# Patient Record
Sex: Male | Born: 1955 | Race: White | Hispanic: No | Marital: Married | Smoking: Former smoker
Health system: Southern US, Community
[De-identification: ages and names within clinical notes are randomized; demographics above are authoritative.]

## PROBLEM LIST (undated history)

## (undated) DIAGNOSIS — N400 Enlarged prostate without lower urinary tract symptoms: Secondary | ICD-10-CM

## (undated) DIAGNOSIS — M25511 Pain in right shoulder: Secondary | ICD-10-CM

## (undated) DIAGNOSIS — T7840XA Allergy, unspecified, initial encounter: Secondary | ICD-10-CM

## (undated) DIAGNOSIS — R001 Bradycardia, unspecified: Secondary | ICD-10-CM

## (undated) DIAGNOSIS — F419 Anxiety disorder, unspecified: Secondary | ICD-10-CM

## (undated) DIAGNOSIS — K219 Gastro-esophageal reflux disease without esophagitis: Secondary | ICD-10-CM

## (undated) DIAGNOSIS — K429 Umbilical hernia without obstruction or gangrene: Secondary | ICD-10-CM

## (undated) DIAGNOSIS — Z860100 Personal history of colon polyps, unspecified: Secondary | ICD-10-CM

## (undated) DIAGNOSIS — C801 Malignant (primary) neoplasm, unspecified: Secondary | ICD-10-CM

## (undated) DIAGNOSIS — C9110 Chronic lymphocytic leukemia of B-cell type not having achieved remission: Secondary | ICD-10-CM

## (undated) DIAGNOSIS — D699 Hemorrhagic condition, unspecified: Secondary | ICD-10-CM

## (undated) DIAGNOSIS — F329 Major depressive disorder, single episode, unspecified: Secondary | ICD-10-CM

## (undated) DIAGNOSIS — F32A Depression, unspecified: Secondary | ICD-10-CM

## (undated) HISTORY — DX: Hemorrhagic condition, unspecified: D69.9

## (undated) HISTORY — DX: Benign prostatic hyperplasia without lower urinary tract symptoms: N40.0

## (undated) HISTORY — DX: Malignant (primary) neoplasm, unspecified: C80.1

## (undated) HISTORY — DX: Depression, unspecified: F32.A

## (undated) HISTORY — DX: Anxiety disorder, unspecified: F41.9

## (undated) HISTORY — PX: HERNIA REPAIR: SHX51

## (undated) HISTORY — DX: Pain in right shoulder: M25.511

## (undated) HISTORY — DX: Chronic lymphocytic leukemia of B-cell type not having achieved remission: C91.10

## (undated) HISTORY — DX: Umbilical hernia without obstruction or gangrene: K42.9

## (undated) HISTORY — DX: Major depressive disorder, single episode, unspecified: F32.9

## (undated) HISTORY — DX: Bradycardia, unspecified: R00.1

## (undated) HISTORY — DX: Allergy, unspecified, initial encounter: T78.40XA

## (undated) HISTORY — DX: Gastro-esophageal reflux disease without esophagitis: K21.9

## (undated) HISTORY — DX: Personal history of colon polyps, unspecified: Z86.0100

## (undated) HISTORY — DX: Personal history of colonic polyps: Z86.010

---

## 1999-11-05 ENCOUNTER — Encounter: Payer: Self-pay | Admitting: Family Medicine

## 1999-11-05 LAB — CONVERTED CEMR LAB: PSA: 0.7 ng/mL

## 2002-07-25 ENCOUNTER — Encounter: Payer: Self-pay | Admitting: Family Medicine

## 2004-08-27 ENCOUNTER — Ambulatory Visit: Payer: Self-pay | Admitting: Family Medicine

## 2004-10-17 ENCOUNTER — Ambulatory Visit: Payer: Self-pay | Admitting: Family Medicine

## 2004-10-21 ENCOUNTER — Ambulatory Visit: Payer: Self-pay | Admitting: Family Medicine

## 2005-02-19 ENCOUNTER — Ambulatory Visit: Payer: Self-pay | Admitting: Internal Medicine

## 2005-02-26 ENCOUNTER — Ambulatory Visit: Payer: Self-pay | Admitting: Internal Medicine

## 2006-05-04 ENCOUNTER — Ambulatory Visit: Payer: Self-pay | Admitting: Family Medicine

## 2006-05-06 ENCOUNTER — Ambulatory Visit: Payer: Self-pay | Admitting: Family Medicine

## 2006-07-21 ENCOUNTER — Ambulatory Visit: Payer: Self-pay | Admitting: Family Medicine

## 2006-07-22 ENCOUNTER — Emergency Department: Payer: Self-pay | Admitting: Emergency Medicine

## 2006-07-22 ENCOUNTER — Other Ambulatory Visit: Payer: Self-pay

## 2006-07-23 ENCOUNTER — Ambulatory Visit: Payer: Self-pay | Admitting: Family Medicine

## 2006-07-27 ENCOUNTER — Ambulatory Visit: Payer: Self-pay | Admitting: Family Medicine

## 2006-08-04 ENCOUNTER — Ambulatory Visit: Payer: Self-pay | Admitting: Internal Medicine

## 2006-08-13 ENCOUNTER — Ambulatory Visit: Payer: Self-pay | Admitting: Internal Medicine

## 2006-09-01 ENCOUNTER — Ambulatory Visit: Payer: Self-pay | Admitting: Family Medicine

## 2006-09-07 ENCOUNTER — Encounter: Payer: Self-pay | Admitting: Family Medicine

## 2006-10-13 ENCOUNTER — Ambulatory Visit: Payer: Self-pay | Admitting: *Deleted

## 2006-10-21 ENCOUNTER — Ambulatory Visit: Payer: Self-pay | Admitting: Family Medicine

## 2006-10-23 ENCOUNTER — Ambulatory Visit: Payer: Self-pay | Admitting: Family Medicine

## 2006-11-09 ENCOUNTER — Ambulatory Visit: Payer: Self-pay | Admitting: Family Medicine

## 2007-05-03 ENCOUNTER — Encounter: Payer: Self-pay | Admitting: Family Medicine

## 2007-05-03 DIAGNOSIS — J309 Allergic rhinitis, unspecified: Secondary | ICD-10-CM | POA: Insufficient documentation

## 2007-05-03 DIAGNOSIS — K219 Gastro-esophageal reflux disease without esophagitis: Secondary | ICD-10-CM | POA: Insufficient documentation

## 2007-05-10 ENCOUNTER — Ambulatory Visit: Payer: Self-pay | Admitting: Family Medicine

## 2007-05-10 LAB — CONVERTED CEMR LAB
ALT: 54 units/L — ABNORMAL HIGH (ref 0–53)
Albumin: 3.9 g/dL (ref 3.5–5.2)
Alkaline Phosphatase: 75 units/L (ref 39–117)
BUN: 12 mg/dL (ref 6–23)
Basophils Absolute: 0 10*3/uL (ref 0.0–0.1)
Basophils Relative: 0 % (ref 0.0–1.0)
Calcium: 9 mg/dL (ref 8.4–10.5)
Cholesterol: 224 mg/dL (ref 0–200)
Creatinine, Ser: 1 mg/dL (ref 0.4–1.5)
Direct LDL: 155.3 mg/dL
GFR calc Af Amer: 102 mL/min
HDL: 37.9 mg/dL — ABNORMAL LOW (ref >39.0)
Hemoglobin: 13.9 g/dL (ref 13.0–17.0)
MCHC: 35.1 g/dL (ref 30.0–36.0)
Monocytes Absolute: 0.6 10*3/uL (ref 0.2–0.7)
Monocytes Relative: 8.8 % (ref 3.0–11.0)
Platelets: 278 10*3/uL (ref 150–400)
Potassium: 4.1 meq/L (ref 3.5–5.1)
RDW: 12.5 % (ref 11.5–14.6)
TSH: 0.6 microintl units/mL (ref 0.35–5.50)
Total Bilirubin: 0.9 mg/dL (ref 0.3–1.2)
Total CHOL/HDL Ratio: 5.9
Triglycerides: 176 mg/dL — ABNORMAL HIGH (ref 0–149)
VLDL: 35 mg/dL (ref 0–40)

## 2007-05-12 ENCOUNTER — Ambulatory Visit: Payer: Self-pay | Admitting: Family Medicine

## 2007-05-12 DIAGNOSIS — F4323 Adjustment disorder with mixed anxiety and depressed mood: Secondary | ICD-10-CM | POA: Insufficient documentation

## 2007-05-27 ENCOUNTER — Encounter: Payer: Self-pay | Admitting: Family Medicine

## 2007-06-15 ENCOUNTER — Ambulatory Visit: Payer: Self-pay | Admitting: Family Medicine

## 2007-06-16 ENCOUNTER — Encounter (INDEPENDENT_AMBULATORY_CARE_PROVIDER_SITE_OTHER): Payer: Self-pay | Admitting: *Deleted

## 2007-07-01 ENCOUNTER — Telehealth (INDEPENDENT_AMBULATORY_CARE_PROVIDER_SITE_OTHER): Payer: Self-pay | Admitting: *Deleted

## 2007-07-09 ENCOUNTER — Ambulatory Visit (HOSPITAL_BASED_OUTPATIENT_CLINIC_OR_DEPARTMENT_OTHER): Admission: RE | Admit: 2007-07-09 | Discharge: 2007-07-09 | Payer: Self-pay | Admitting: Surgery

## 2007-07-10 ENCOUNTER — Encounter: Payer: Self-pay | Admitting: Family Medicine

## 2007-08-10 ENCOUNTER — Ambulatory Visit: Payer: Self-pay | Admitting: Family Medicine

## 2007-08-10 DIAGNOSIS — E7800 Pure hypercholesterolemia, unspecified: Secondary | ICD-10-CM | POA: Insufficient documentation

## 2007-08-10 LAB — CONVERTED CEMR LAB
HDL: 39.3 mg/dL (ref >39.0)
Total CHOL/HDL Ratio: 4.1
Triglycerides: 133 mg/dL (ref 0–149)

## 2007-08-12 ENCOUNTER — Ambulatory Visit: Payer: Self-pay | Admitting: Family Medicine

## 2007-10-01 ENCOUNTER — Telehealth (INDEPENDENT_AMBULATORY_CARE_PROVIDER_SITE_OTHER): Payer: Self-pay | Admitting: *Deleted

## 2007-11-04 ENCOUNTER — Telehealth (INDEPENDENT_AMBULATORY_CARE_PROVIDER_SITE_OTHER): Payer: Self-pay | Admitting: *Deleted

## 2007-11-09 ENCOUNTER — Ambulatory Visit: Payer: Self-pay | Admitting: Family Medicine

## 2008-05-17 ENCOUNTER — Ambulatory Visit: Payer: Self-pay | Admitting: Family Medicine

## 2008-05-18 LAB — CONVERTED CEMR LAB
AST: 28 units/L (ref 0–37)
Alkaline Phosphatase: 67 units/L (ref 39–117)
Bilirubin, Direct: 0.1 mg/dL (ref 0.0–0.3)
CO2: 31 meq/L (ref 19–32)
Chloride: 111 meq/L (ref 96–112)
GFR calc Af Amer: 91 mL/min
Glucose, Bld: 97 mg/dL (ref 70–99)
HDL: 35.9 mg/dL — ABNORMAL LOW (ref >39.0)
LDL Cholesterol: 119 mg/dL — ABNORMAL HIGH (ref 0–99)
Lymphocytes Relative: 25.4 % (ref 12.0–46.0)
Monocytes Relative: 9.2 % (ref 3.0–12.0)
Neutrophils Relative %: 63.1 % (ref 43.0–77.0)
Platelets: 284 10*3/uL (ref 150–400)
Potassium: 4.4 meq/L (ref 3.5–5.1)
RDW: 12.3 % (ref 11.5–14.6)
Sodium: 143 meq/L (ref 135–145)
Total CHOL/HDL Ratio: 4.8
Total Protein: 6.7 g/dL (ref 6.0–8.3)
Triglycerides: 95 mg/dL (ref 0–149)

## 2008-05-23 ENCOUNTER — Ambulatory Visit: Payer: Self-pay | Admitting: Family Medicine

## 2008-08-21 ENCOUNTER — Telehealth: Payer: Self-pay | Admitting: Family Medicine

## 2008-11-10 ENCOUNTER — Emergency Department: Payer: Self-pay

## 2008-12-12 ENCOUNTER — Ambulatory Visit: Payer: Self-pay | Admitting: Family Medicine

## 2009-02-27 ENCOUNTER — Telehealth: Payer: Self-pay | Admitting: Family Medicine

## 2009-09-24 ENCOUNTER — Telehealth: Payer: Self-pay | Admitting: Family Medicine

## 2009-09-25 ENCOUNTER — Telehealth: Payer: Self-pay | Admitting: Family Medicine

## 2009-09-28 ENCOUNTER — Ambulatory Visit: Payer: Self-pay | Admitting: Family Medicine

## 2009-09-30 LAB — CONVERTED CEMR LAB
Albumin: 4.3 g/dL (ref 3.5–5.2)
BUN: 8 mg/dL (ref 6–23)
Basophils Relative: 0 % (ref 0.0–3.0)
Bilirubin, Direct: 0.1 mg/dL (ref 0.0–0.3)
Chloride: 107 meq/L (ref 96–112)
Cholesterol: 200 mg/dL (ref 0–200)
Eosinophils Relative: 1.9 % (ref 0.0–5.0)
HCT: 43 % (ref 39.0–52.0)
LDL Cholesterol: 120 mg/dL — ABNORMAL HIGH (ref 0–99)
Lymphs Abs: 1.4 10*3/uL (ref 0.7–4.0)
MCV: 92.2 fL (ref 78.0–100.0)
Monocytes Absolute: 0.4 10*3/uL (ref 0.1–1.0)
Neutro Abs: 3.5 10*3/uL (ref 1.4–7.7)
Potassium: 4.1 meq/L (ref 3.5–5.1)
RBC: 4.67 M/uL (ref 4.22–5.81)
Total Protein: 7 g/dL (ref 6.0–8.3)
VLDL: 37 mg/dL (ref 0.0–40.0)
WBC: 5.4 10*3/uL (ref 4.5–10.5)

## 2009-10-04 ENCOUNTER — Ambulatory Visit: Payer: Self-pay | Admitting: Family Medicine

## 2010-03-28 ENCOUNTER — Encounter (INDEPENDENT_AMBULATORY_CARE_PROVIDER_SITE_OTHER): Payer: Self-pay | Admitting: *Deleted

## 2010-03-29 ENCOUNTER — Ambulatory Visit: Payer: Self-pay | Admitting: Family Medicine

## 2010-03-29 DIAGNOSIS — K589 Irritable bowel syndrome without diarrhea: Secondary | ICD-10-CM | POA: Insufficient documentation

## 2010-05-14 ENCOUNTER — Ambulatory Visit: Payer: Self-pay | Admitting: Internal Medicine

## 2010-06-27 ENCOUNTER — Ambulatory Visit: Payer: Self-pay | Admitting: Internal Medicine

## 2010-07-01 ENCOUNTER — Encounter (INDEPENDENT_AMBULATORY_CARE_PROVIDER_SITE_OTHER): Payer: Self-pay | Admitting: *Deleted

## 2010-07-07 ENCOUNTER — Encounter: Payer: Self-pay | Admitting: Family Medicine

## 2010-07-11 ENCOUNTER — Telehealth: Payer: Self-pay | Admitting: Family Medicine

## 2010-09-23 ENCOUNTER — Telehealth (INDEPENDENT_AMBULATORY_CARE_PROVIDER_SITE_OTHER): Payer: Self-pay | Admitting: *Deleted

## 2010-09-25 ENCOUNTER — Encounter: Payer: Self-pay | Admitting: Family Medicine

## 2010-09-25 ENCOUNTER — Other Ambulatory Visit (INDEPENDENT_AMBULATORY_CARE_PROVIDER_SITE_OTHER): Payer: Medicare HMO

## 2010-09-25 ENCOUNTER — Ambulatory Visit: Admit: 2010-09-25 | Payer: Self-pay | Admitting: Family Medicine

## 2010-09-25 ENCOUNTER — Encounter (INDEPENDENT_AMBULATORY_CARE_PROVIDER_SITE_OTHER): Payer: Self-pay | Admitting: *Deleted

## 2010-09-25 ENCOUNTER — Other Ambulatory Visit: Payer: Self-pay | Admitting: Family Medicine

## 2010-09-25 DIAGNOSIS — E7800 Pure hypercholesterolemia, unspecified: Secondary | ICD-10-CM

## 2010-09-25 DIAGNOSIS — K589 Irritable bowel syndrome without diarrhea: Secondary | ICD-10-CM

## 2010-09-25 DIAGNOSIS — Z125 Encounter for screening for malignant neoplasm of prostate: Secondary | ICD-10-CM

## 2010-09-25 DIAGNOSIS — E785 Hyperlipidemia, unspecified: Secondary | ICD-10-CM

## 2010-09-25 LAB — BASIC METABOLIC PANEL
BUN: 9 mg/dL (ref 6–23)
Calcium: 9.5 mg/dL (ref 8.4–10.5)
Creatinine, Ser: 1.1 mg/dL (ref 0.4–1.5)
GFR: 75.66 mL/min (ref >60.00)
Glucose, Bld: 94 mg/dL (ref 70–99)
Sodium: 141 mEq/L (ref 135–145)

## 2010-09-25 LAB — LIPID PANEL
Cholesterol: 234 mg/dL — ABNORMAL HIGH (ref 0–200)
HDL: 41.4 mg/dL (ref >39.00)
Triglycerides: 164 mg/dL — ABNORMAL HIGH (ref 0.0–149.0)
VLDL: 32.8 mg/dL (ref 0.0–40.0)

## 2010-09-25 LAB — HEPATIC FUNCTION PANEL
Albumin: 4.2 g/dL (ref 3.5–5.2)
Alkaline Phosphatase: 87 U/L (ref 39–117)

## 2010-09-25 NOTE — Assessment & Plan Note (Signed)
Summary: CPX/RBH   Vital Signs:  Patient profile:   55 year old male Weight:      189 pounds BMI:     24.35 Temp:     98.6 degrees F oral Pulse rate:   84 / minute Pulse rhythm:   regular BP sitting:   118 / 70  (left arm) Cuff size:   regular  Vitals Entered By: Sydell Axon LPN (October 15, 2009 1:51 PM) CC: 30 minute checkup, had a colonoscopy by Dr. Marina Goodell 2-3 years ago because of stomach problems   History of Present Illness: Pt here for Comp Exam.   Preventive Screening-Counseling & Management  Alcohol-Tobacco     Alcohol drinks/day: 1     Alcohol type: beer      Smoking Status: quit     Year Quit: 1982     Pack years: 8     Passive Smoke Exposure: no  Caffeine-Diet-Exercise     Caffeine use/day:  2 per week     Does Patient Exercise: no  Problems Prior to Update: 1)  Other Spec Sites of Sprains and Strains, Chestwall  (ICD-848.8) 2)  Pure Hypercholesterolemia  (ICD-272.0) 3)  Screening For Malignannt Neoplasm, Site Nec  (ICD-V76.49) 4)  Anxiety Depression  (ICD-300.4) 5)  Health Maintenance Exam  (ICD-V70.0) 6)  Screening For Malignant Neoplasm, Prostate  (ICD-V76.44) 7)  Crohn's Disease Via Colonosopy  (ICD-555.9) 8)  Hypertriglyceridemia, Mild (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 Take 1 Tablet By Mouth Once A Day 2)  Flonase 50 Mcg/act Susp (Fluticasone Propionate) .... Spray 2 Spray Into Both Nostrils Once A Day 3)  Effexor Xr 37.5 Mg Xr24h-Cap (Venlafaxine Hcl) .... Take One By Mouth Twice A Day  Allergies: 1)  Augmentin  Past History:  Past Medical History: Last updated: 05/03/2007 Allergic rhinitis GERD  Past Surgical History: Last updated: 07/26/2007 HOSP Gerd 3 days 1980 Bilat Hernia Repairs 1996 EGD wnl 04/28/96 EGD, GERD  07/10/03 Colonoscopy, polyps, divertics, 07/10/03 EGD, GERD 02/26/05 Recurrent Inguinal  Hernia Repair (Dr Daphine Deutscher) 07/09/07  Family History: Last updated: 10-15-2009 Father: Died at age 16 with prostate cancer, depression and obesity Mother: A  85  hypertension, elevated cholesterol Brother A  60 high cholesterol Brother A  43 high cholesterol Sister A  62 DM  Sister A  46 CV -  BP + Mother Diabetes + Father Stoke + Eustace Quail Cancer- Prostate + Father Depression + Father  Social History: Last updated: 05/03/2007 Marital Status: Remarried since May 2005  Children: 2 with his past marriage Occupation: Scientist, 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 [place]  Risk Factors: Alcohol Use: 1 (Oct 15, 2009) Caffeine Use:  2 per week (10/15/2009) Exercise: no (October 15, 2009)  Risk Factors: Smoking Status: quit (Oct 15, 2009) Passive Smoke Exposure: no (10-15-09)  Family History: Father: Died at age 81 with prostate cancer, depression and obesity Mother: A  85  hypertension, elevated cholesterol Brother A  60 high cholesterol Brother A  43 high cholesterol Sister A  62 DM  Sister A  46 CV -  BP + Mother Diabetes + Father Stoke + Pat Uncles Cancer- Prostate + Father Depression + Father  Social History: Caffeine use/day:   2 per week  Review of Systems General:  Denies chills, fatigue, fever, sweats, weakness, and weight loss. Eyes:  Denies blurring, discharge, eye pain, itching, and red eye. ENT:  Complains of decreased hearing  and ringing in ears; denies ear discharge and earache. CV:  Denies chest pain or discomfort, fainting, fatigue, palpitations, shortness of breath with exertion, and swelling of feet. Resp:  Denies cough, shortness of breath, and wheezing. GI:  Complains of indigestion; denies abdominal pain, bloody stools, change in bowel habits, constipation, dark tarry stools, diarrhea, loss of appetite, nausea, vomiting, vomiting blood, and yellowish skin color; occas. GU:  Denies discharge, dysuria, nocturia, and urinary frequency. MS:  Denies joint pain,  low back pain, muscle aches, muscle weakness, and stiffness. Derm:  Denies dryness, itching, and rash. Neuro:  Denies memory loss, numbness, tingling, and tremors.   Impression & Recommendations:  Problem # 1:  HEALTH MAINTENANCE EXAM (ICD-V70.0) Assessment Comment Only  Problem # 2:  PURE HYPERCHOLESTEROLEMIA (ICD-272.0) Assessment: Unchanged Trigs slightly elevated, avoid sweets and carbs. Labs Reviewed: SGOT: 22 (09/28/2009)   SGPT: 22 (09/28/2009)   HDL:43.20 (09/28/2009), 35.9 (05/17/2008)  LDL:120 (09/28/2009), 119 (05/17/2008)  Chol:200 (09/28/2009), 174 (05/17/2008)  Trig:185.0 (09/28/2009), 95 (05/17/2008)  Problem # 3:  SCREENING FOR MALIGNANT NEOPLASM, PROSTATE (ICD-V76.44) Assessment: Unchanged Stable.  Problem # 4:  CROHN'S DISEASE VIA COLONOSOPY (ICD-555.9) Check with GI for repeat colonoscopy.  Problem # 5:  HYPERTRIGLYCERIDEMIA, MILD (206) (ICD-272.1) Assessment: Unchanged Trig slighytly. Labs Reviewed: SGOT: 22 (09/28/2009)   SGPT: 22 (09/28/2009)   HDL:43.20 (09/28/2009), 35.9 (05/17/2008)  LDL:120 (09/28/2009), 119 (05/17/2008)  Chol:200 (09/28/2009), 174 (05/17/2008)  Trig:185.0 (09/28/2009), 95 (05/17/2008)  Problem # 6:  GERD (ICD-530.81) Assessment: Unchanged Stable. His updated medication list for this problem includes:    Aciphex 20 Mg Tbec (Rabeprazole sodium) .Marland Kitchen... Take 1 tablet by mouth once a day  Diagnostics Reviewed:  Discussed lifestyle modifications, diet, antacids/medications, and preventive measures. Handout provided.   Complete Medication List: 1)  Aciphex 20 Mg Tbec (Rabeprazole sodium) .... Take 1 tablet by mouth once a day 2)  Flonase 50 Mcg/act Susp (Fluticasone propionate) .... Spray 2 spray into both nostrils once a day 3)  Effexor Xr 37.5 Mg Xr24h-cap (Venlafaxine hcl) .... Take one by mouth twice a day  Patient Instructions: 1)  RTC one year or as needed.  Current Allergies (reviewed today): AUGMENTIN

## 2010-09-25 NOTE — Progress Notes (Signed)
Summary: Rx Fluticasone & Effexor  Phone Note Refill Request Call back at [phone] Message from:  Karin Golden on September 24, 2009 8:27 AM  Refills Requested: Medication #1:  FLONASE 50 MCG/ACT SUSP Spray 2 spray into both nostrils once a day   Last Refilled: 08/12/2009  Medication #2:  EFFEXOR XR 37.5 MG XR24H-CAP take one by mouth twice a day.   Last Refilled: 08/12/2009 Received faxed refill request, please advise   Method Requested: Electronic Initial call taken by: Linde Gillis CMA Duncan Dull),  September 24, 2009 8:27 AM    Prescriptions: EFFEXOR XR 37.5 MG XR24H-CAP (VENLAFAXINE HCL) take one by mouth twice a day  #90 x 3   Entered and Authorized by:   Shaune Leeks MD   Signed by:   Shaune Leeks MD on 09/24/2009   Method used:   Electronically to        [facility] [address]* (retail)       [address] [place], [place]  [address]       Ph: [phone]       Fax: [phone]   RxID:   [id number] FLONASE 50 MCG/ACT SUSP (FLUTICASONE PROPIONATE) Spray 2 spray into both nostrils once a day  #1 x 12   Entered and Authorized by:   Shaune Leeks MD   Signed by:   Shaune Leeks MD on 09/24/2009   Method used:   Electronically to        [facility] [address] (retail)       [address] [place], [place]  [address]       Ph: [phone]       Fax: [phone]   RxID:   [id number]

## 2010-09-25 NOTE — Assessment & Plan Note (Signed)
Summary: transfer from [facility]   Vital Signs:  Patient profile:   55 year old male Height:      74 inches Weight:      184.75 pounds Temp:     98.6 degrees F oral Pulse rate:   60 / minute Pulse rhythm:   regular BP sitting:   110 / 60  (left arm) Cuff size:   large  Vitals Entered By: Selena Batten Dance CMA Duncan Dull) (March 29, 2010 2:31 PM)  CC: Follow up   History of Present Illness: CC: GERD  GERD - On aciphex 20mg  QAM, then over last few weeks has increased to two times a day because one not improving.  Previously tried prilosec then changed to aciphex.  sxs: reflux at night when lays down.  Using bed wedge.  Doesn't really notice difference with foods.  Tries to eat at least 3 hours prior to sleep.  Early satiety over last few years.  No nausea/vomiting, no weight loss.  Feels food doesn't digest normally.  Doesn't smoke, minimial EtOH and caffeine.    Did have colonoscopy/endoscsopy in last 5 years (and has had several in past).  ? h/o crohn's vs IBS.  Anxiety - feels anxiety exacerbates IBS/stomache issues.  Would like refill of xanax (previously prescribed by Hetty Ely).  Taking effexor to help with bowels.  Reviewing previous GI notes - dx with GERD, IBS, nonspecific mild ileitis from previous colonoscopy, IBD markers negative, celiac sprue negative, ESR normal, TSH normal.  Preventive Screening-Counseling & Management  Alcohol-Tobacco     Alcohol drinks/day: <1     Smoking Status: never  Caffeine-Diet-Exercise     Caffeine use/day: 0-1      Drug Use:  never.    Current Medications (verified): 1)  Nexium 40 Mg Pack (Esomeprazole Magnesium) .... One Daily For Reflux 2)  Flonase 50 Mcg/act Susp (Fluticasone Propionate) .... Spray 2 Spray Into Both Nostrils Once A Day 3)  Effexor Xr 37.5 Mg Xr24h-Cap (Venlafaxine Hcl) .... Take One By Mouth Twice A Day 4)  Ativan 1 Mg Tabs (Lorazepam) .... Take One By Mouth Two Times A Day As Needed Anxiety 5)  Multivitamins  Tabs  (Multiple Vitamin) .... Per Report 6)  Vitamin D3 400 Unit Chew (Cholecalciferol) .... Per Report  Allergies: 1)  Augmentin  Past History:  Past medical, surgical, family and social histories (including risk factors) reviewed for relevance to current acute and chronic problems.  Past Medical History: Reviewed history from 05/03/2007 and no changes required. Allergic rhinitis GERD  Past Surgical History: HOSP Gerd 3 days 1980 Bilat Hernia Repairs 1996 EGD wnl 04/28/96 EGD, GERD  07/10/03 Colonoscopy, polyps, divertics, 07/10/03 EGD, GERD 02/26/05 Recurrent Inguinal Hernia Repair with mesh (Dr Daphine Deutscher) 07/09/07  Family History: Reviewed history from 10/04/2009 and no changes required. Father: Died at age 58 with prostate cancer, depression and obesity Mother: A  82  hypertension, elevated cholesterol Brother A  60 high cholesterol Brother A  43 high cholesterol Sister A  62 DM  Sister A  46 CV -  BP + Mother Diabetes + Father Stoke + Pat Uncles Cancer- Prostate + Father Depression + Father  No other CA (besides prostate), no CAD, no CVA.  Social History: Reviewed history from 05/03/2007 and no changes required. No smoking, rec drugs,  + social EtOH. 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 Head:  Normocephalic and atraumatic without obvious abnormalities. No apparent alopecia or balding. Mouth:  Oral mucosa and oropharynx without lesions or exudates.  pharynx with white patches Neck:  No deformities, masses, or tenderness noted. Lungs:  Normal respiratory effort, chest expands symmetrically. Lungs  are clear to auscultation, no crackles or wheezes. Good inspiration, good breath sounds to the periphery. Heart:  Normal rate and regular rhythm. S1 and S2 normal without gallop, murmur, click, rub or other extra sounds. Abdomen:  Bowel sounds positive,abdomen soft and non-tender without masses, organomegaly or hernias noted. Extremities:  no edema Skin:  Intact without suspicious lesions or rashes   Impression & Recommendations:  Problem # 1:  GERD (ICD-530.81) Assessment Deteriorated Pt knows GERD precautions.  Discussed foods that worsen reflux.  aciphex no longer helping.  trial of nexium, RTC 1 mo for f/u.  If no improvement, low threshold to refer to GI for f/u given established and endorsing worsening GERD/early satiety (only 5 lb weight loss in last 6 months).  previous workup negative for IBD and celiac sprue.  Latest TSH 0.8  His updated medication list for this problem includes:    Nexium 40 Mg Pack (Esomeprazole magnesium) ..... One daily for reflux  Problem # 2:  ILEITIS PER COLONOSCOPY (ICD-558.9) w/u for celiac sprue and IBD negative per latest GI note.  Problem # 3:  IRRITABLE BOWEL SYNDROME (ICD-564.1) no BM changes currently.  continue to monitor.  apparently previously did not improve on reglan.  Problem # 4:  PURE HYPERCHOLESTEROLEMIA (ICD-272.0) latest LDL 120.  off statin.  recheck in 1 year.  consider restart statin.  Labs Reviewed: SGOT: 22 (09/28/2009)   SGPT: 22 (09/28/2009)   HDL:43.20 (09/28/2009), 35.9 (05/17/2008)  LDL:120 (09/28/2009), 119 (05/17/2008)  Chol:200 (09/28/2009), 174 (05/17/2008)  Trig:185.0 (09/28/2009), 95 (05/17/2008)  Problem # 5:  ANXIETY DEPRESSION (ICD-300.4) discussed detriments of relying on pills to cope with anxiety.  discussed my dislike of xanax.  trial of ativan, work on other relaxation techniques to cope with anxiety.  Continue effexor for depression/GI   Complete Medication List: 1)  Nexium 40 Mg Pack (Esomeprazole  magnesium) .... One daily for reflux 2)  Flonase 50 Mcg/act Susp (Fluticasone propionate) .... Spray 2 spray into both nostrils once a day 3)  Effexor Xr 37.5 Mg Xr24h-cap (Venlafaxine hcl) .... Take one by mouth twice a day 4)  Ativan 1 Mg Tabs (Lorazepam) .... Take one by mouth two times a day as needed anxiety 5)  Multivitamins Tabs (Multiple vitamin) .... Per report 6)  Vitamin D3 400 Unit Chew (Cholecalciferol) .... Per report  Patient Instructions: 1)  Return in 1-2 months for follow up. 2)  Return in 6 months for CPE. 3)  Stop aciphex.  Start Nexium one daily for reflux. 4)  Stay away from acidic foods like citruses, mints, and spicy foods.  Also minimize caffeine. 5)  Continue other measures for reflux. 6)  Pleasure to meet you today. Prescriptions: ATIVAN 1 MG TABS (LORAZEPAM) take one by mouth two times a day as needed anxiety  #30 x 0   Entered and Authorized by:   [name]  MD   Signed by:   [name]  MD on 03/29/2010   Method used:   Print then Give to Patient   RxID:  [id number] NEXIUM 40 MG PACK (ESOMEPRAZOLE MAGNESIUM) one daily for reflux  #90 x 3   Entered and Authorized by:   [name]  MD   Signed by:   [name]  MD on 03/29/2010   Method used:   Electronically to        [facility] [address] [address] (retail)       [address] [place], [place]  [address]       Ph: [phone]       Fax: [phone]   RxID:   [id number]   Current Allergies (reviewed today): AUGMENTIN

## 2010-09-25 NOTE — Progress Notes (Signed)
Summary: Clarification of Rx Effexor vs. Venlafaxine  Phone Note From Pharmacy Call back at [phone]   Caller: [facility] [address] Call For: Dr. Hetty Ely  Summary of Call: Received electronic Rx for Effexor XR 37.5mg .  Pharmacy called to verify that we are changing Venlafaxine HCL 37.5mg  to Effexor.  Please advise. Initial call taken by: Linde Gillis CMA Duncan Dull),  September 25, 2009 8:49 AM  Follow-up for Phone Call        Generic is fine. Follow-up by: Shaune Leeks MD,  September 25, 2009 1:45 PM  Additional Follow-up for Phone Call Additional follow up Details #1::        Spoke to pharmacist, Rx sent in for Effexor XR, patient is taking and has been on the plain Effexor.  Advised that plain Rx is ok and generic is ok as well. Additional Follow-up by: Linde Gillis CMA Duncan Dull),  September 25, 2009 1:55 PM

## 2010-09-25 NOTE — Letter (Signed)
Summary: [name] letter  [place] [facility]  [address] [place], [place] [address]   Phone: [phone]  Fax: [phone]       03/28/2010 MRN: [id number]  [facility] [address] [place], [place]  [address]  Dear [facility] [place], and [place] announce the retirement of Arta Silence, M.D., from full-time practice at the [facility] office effective February 21, 2010 and his plans of returning part-time.  It is important to Dr. Hetty Ely and to our practice that you understand that [facility] has seven physicians in our office for your health care needs.  We will continue to offer the same exceptional care that you have today.    Dr. Hetty Ely has spoken to many of you about his plans for retirement and returning part-time in the fall.   We will continue to work with you through the transition to schedule appointments for you in the office and meet the high standards that [facility] is committed to.   Again, it is with great pleasure that we share the news that Dr. Hetty Ely will return to [facility] in October of 2011 with a reduced schedule.    If you have any questions, or would like to request an appointment with one of our physicians, please call us at [phone] and press the option for Scheduling an appointment.  We take pleasure in providing you with excellent patient care and look forward to seeing you at your next office visit.  Our [facility] Physicians are:  Tillman Abide, M.D. Laurita Quint, M.D. Roxy Manns, M.D. Kerby Nora, M.D. Hannah Beat, M.D. Ruthe Mannan, M.D. We proudly welcomed Raechel Ache, M.D. and [name], M.D. to the practice in July/August 2011.  Sincerely,  [facility]

## 2010-09-25 NOTE — Miscellaneous (Signed)
Summary: MED UPDATE   Medications Added NEXIUM 40 MG CPDR (ESOMEPRAZOLE MAGNESIUM)        Clinical Lists Changes  Medications: Changed medication from NEXIUM 40 MG PACK (ESOMEPRAZOLE MAGNESIUM) one twice daily for reflux to NEXIUM 40 MG CPDR (ESOMEPRAZOLE MAGNESIUM)     Prior Medications: FLONASE 50 MCG/ACT SUSP (FLUTICASONE PROPIONATE) Spray 2 spray into both nostrils once a day EFFEXOR XR 37.5 MG XR24H-CAP (VENLAFAXINE HCL) take one by mouth twice a day ATIVAN 1 MG TABS (LORAZEPAM) take one by mouth two times a day as needed anxiety MULTIVITAMINS  TABS (MULTIPLE VITAMIN) per report VITAMIN D3 400 UNIT CHEW (CHOLECALCIFEROL) one daily RANITIDINE HCL 150 MG CAPS (RANITIDINE HCL) one by mouth at bedtime Current Allergies: AUGMENTIN

## 2010-09-25 NOTE — Progress Notes (Signed)
Summary: ?info  Phone Note Call from Patient Call back at Home Phone [phone]   Caller: Patient Call For: [name]  MD Summary of Call: Patient  says the was supposed to call you with this information   Patient says that he is only taken 37.5 once a day and it is not time released, also he is going to stay on nexium because insurance will not cover dexilant. Initial call taken by: Benny Lennert CMA Duncan Dull),  July 11, 2010 4:50 PM  Follow-up for Phone Call        thank you.  noted. Follow-up by: [name]  MD,  July 11, 2010 5:01 PM    New/Updated Medications: VENLAFAXINE HCL 37.5 MG TABS (VENLAFAXINE HCL) one daily for anxiety/bowels

## 2010-09-25 NOTE — Assessment & Plan Note (Signed)
Summary: FOLLOW UP / LFW   Vital Signs:  Patient profile:   55 year old male Weight:      186.25 pounds Temp:     98.7 degrees F oral Pulse rate:   80 / minute Pulse rhythm:   regular BP sitting:   110 / 70  (left arm) Cuff size:   large  Vitals Entered By: [name] (May 14, 2010 8:00 AM) CC: 2 month Follow up   History of Present Illness: CC: f/u stomach  Aciphex BID did not help reflux sxs so switched to Nexium once daily which seems to be controlling acid well, but still feels occasional "lump in throat".  Also slight bloating/discomfort.  Sometimes has gas.  Previously aciphex not controlling acid well.  Does drink 1 beer/day.  Last colonoscopy/EGD - 2007 thought IBS 2/2 anxiety.  weight up 2 lbs.  sxs: reflux at night when lays down.  Using bed wedge.  Doesn't really notice difference with foods.  Tries to eat at least 3 hours prior to sleep.  No nausea/vomiting, no weight loss.  Feels food doesn't digest normally.  Previously on aciphex and prilosec.  Doesn't smoke, minimal caffeine ad EtOH (1 beer a day)  Did have colonoscopy/endoscsopy in last 5 years (and has had several in past).  Reviewing previous GI notes - dx with GERD, IBS, nonspecific mild ileitis from previous colonoscopy, IBD markers negative, celiac sprue negative, ESR normal, TSH normal.  Anxiety - feels anxiety exacerbates IBS/stomache issues.  Taking effexor to help with bowels.  only has been on daily dosing of effexor.  Started on ativan last visit, hasn't needed to take.  Allergies: 1)  Augmentin  Past History:  Past Medical History: Last updated: 05/03/2007 Allergic rhinitis GERD  Past Surgical History: Last updated: 03/29/2010 Para [place] 3 days 1980 Bilat Hernia Repairs 1996 EGD wnl 04/28/96 EGD, GERD  07/10/03 Colonoscopy, polyps, divertics, 07/10/03 EGD, GERD 02/26/05 Recurrent Inguinal Hernia Repair with mesh (Dr Daphine Deutscher) 07/09/07  Social History: Last updated:  03/29/2010 No smoking, rec drugs,  + social EtOH. 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 Psych:  normal affect, pleasant, not anxious/nervous   Impression & Recommendations:  Problem # 1:  GERD (ICD-530.81) Pt knows GERD precautions.  Nexium has improved symptoms, but not optimal control as evidenced by pt's feeling of "lump in throat".  Start ranitidine at night.  if not better, consider increasing nexium to BID.  If no improvement, refer to GI for f/u given established and endorsing worsening GERD.  previous workup negative for IBD and celiac sprue.  Latest TSH 0.8 (09/2009).  rec stop EtOH.  His updated medication list for this problem includes:    Nexium 40 Mg Pack (Esomeprazole magnesium) ..... One daily for reflux    Ranitidine Hcl 150 Mg Caps (Ranitidine hcl) ..... One by mouth at bedtime  Problem # 2:  ANXIETY DEPRESSION (ICD-300.4) rec increase effexor to BID.  hasn't needed to use ativan.  ?if anxiety playing component to GERD.    Problem # 3:  IRRITABLE BOWEL SYNDROME (ICD-564.1) no BM changes currently.  feels bowels stable thanks to effexor.  continue to monitor.  apparently previously did not improve on reglan.  Complete Medication List: 1)  Nexium 40 Mg Pack (Esomeprazole magnesium) .... One daily for reflux 2)  Flonase 50 Mcg/act Susp (Fluticasone propionate) .... Spray 2 spray into both nostrils once a day 3)  Effexor Xr 37.5 Mg Xr24h-cap (Venlafaxine hcl) .... Take one by mouth twice a day 4)  Ativan 1 Mg Tabs (Lorazepam) .... Take one by mouth two times a day as needed anxiety 5)   Multivitamins Tabs (Multiple vitamin) .... Per report 6)  Vitamin D3 400 Unit Chew (Cholecalciferol) .... One daily 7)  Ranitidine Hcl 150 Mg Caps (Ranitidine hcl) .... One by mouth at bedtime   Patient Instructions: 1)  Please return 1-2 mo for f/u. 2)  Start ranitidine at night.  Continue nexium in morning. 3)  Increase effexor to twice daily (37.5mg ). 4)  We should hopefully notice some improvement with these measures.  If not helping, call me and we can increase nexium to twice daily. 5)  Pleasure to see you today, call clinic with questions. Prescriptions: RANITIDINE HCL 150 MG CAPS (RANITIDINE HCL) one by mouth at bedtime  #30 x 3   Entered and Authorized by:   [name]  MD   Signed by:   [name]  MD on 05/14/2010   Method used:   Electronically to        [facility] [address] [address] (retail)       [address] [place], [place]  [address]       Ph: [phone]       Fax: [phone]   RxID:   [id number]   Current Allergies (reviewed today): AUGMENTIN
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
## 2010-09-25 NOTE — Medication Information (Signed)
Summary: Nexium Change/[facility]  Nexium Change/[facility]   Imported By: Lanelle Bal 07/25/2010 13:33:50  _____________________________________________________________________  External Attachment:    Type:   Image     Comment:   External Document

## 2010-09-26 LAB — CONVERTED CEMR LAB: Vit D, 25-Hydroxy: 64 ng/mL (ref 30–89)

## 2010-09-30 ENCOUNTER — Encounter (INDEPENDENT_AMBULATORY_CARE_PROVIDER_SITE_OTHER): Payer: Medicare HMO | Admitting: Family Medicine

## 2010-09-30 ENCOUNTER — Encounter: Payer: Self-pay | Admitting: Family Medicine

## 2010-09-30 DIAGNOSIS — Z0000 Encounter for general adult medical examination without abnormal findings: Secondary | ICD-10-CM

## 2010-09-30 DIAGNOSIS — Z125 Encounter for screening for malignant neoplasm of prostate: Secondary | ICD-10-CM

## 2010-09-30 DIAGNOSIS — H9319 Tinnitus, unspecified ear: Secondary | ICD-10-CM | POA: Insufficient documentation

## 2010-09-30 DIAGNOSIS — E7800 Pure hypercholesterolemia, unspecified: Secondary | ICD-10-CM

## 2010-09-30 DIAGNOSIS — Z1211 Encounter for screening for malignant neoplasm of colon: Secondary | ICD-10-CM

## 2010-10-02 NOTE — Progress Notes (Signed)
----   Converted from flag ---- ---- 09/20/2010 12:10 PM, Benjamin Boyden  MD wrote: CMP, FLP, vit D, PSA 272.4, 558.9, V76.44  ---- 09/20/2010 8:44 AM, Liane Comber CMA (AAMA) wrote: Lab orders please! Good Morning! This pt is scheduled for cpx labs Wed, which labs to draw and dx codes to use? Thanks Tasha ------------------------------

## 2010-10-03 ENCOUNTER — Encounter: Payer: Self-pay | Admitting: Family Medicine

## 2010-10-10 NOTE — Assessment & Plan Note (Signed)
Summary: cpe LFW   Vital Signs:  Patient profile:   54 year old male Weight:      189.25 pounds BMI:     24.39 Temp:     98.5 degrees F oral Pulse rate:   80 / minute Pulse rhythm:   regular BP sitting:   132 / 82  (left arm) Cuff size:   regular  Vitals Entered By: [name] (September 30, 2010 8:37 AM) CC: CPx  Hearing Screen 25db HL: Left  500 hz: 25db 1000 hz: No Response 2000 hz: No Response 4000 hz: No Response Right  500 hz: No Response 1000 hz: No Response 2000 hz: No Response 4000 hz: No Response  40db HL: Left  500 hz: 40db 1000 hz: 40db 2000 hz: No Response 4000 hz: No Response Right  500 hz: 40db 1000 hz: 40db 2000 hz: 40db 4000 hz: No Response    History of Present Illness: CC: CPE  here for CPE today.  Reviewed blood work with patient.  no concerns today.  fmhx prostate CA father but not aggressive (father died with it).  stomache - doing ok on nexium daily, maalox at night.  tinnitis and high frequency hearing loss.  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 thinks loss from this.  Preventative: flu shot done this year. UTD tetanus. UTD colonsocopy 2004, polyps.  will call to ask when next due. UTD prostate - check today.  no nocturia.  ok stream.  Allergies: 1)  Augmentin  Past History:  Past Surgical History: Last updated: 03/29/2010 Para Skeans 3 days 1980 Bilat Hernia Repairs 1996 EGD wnl 04/28/96 EGD, GERD  07/10/03 Colonoscopy, polyps, divertics, 07/10/03 EGD, GERD 02/26/05 Recurrent Inguinal Hernia Repair with mesh (Dr Daphine Deutscher) 07/09/07  Past Medical History: Allergic rhinitis Anxiety GERD IBS hyperlipidemia h/o ileitis after last colonsocopy  Family History: F: Died at age 24 with prostate cancer, depression and obesity, DM Mother: A  66  hypertension, elevated cholesterol, CAD stent 34s Brother A  60 high cholesterol Brother A  43 high cholesterol Sister A  64 DM  Sister A   46 [place] + Pat Uncles  No other CA (besides prostate), no CAD, no CVA.  Social History: Reviewed history from 03/29/2010 and no changes required. No smoking, rec drugs,  + social EtOH. 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    Physical Exam  General:  Well-developed,well-nourished,in no acute distress; alert,appropriate and cooperative throughout examination, comfortable Head:  Normocephalic and atraumatic without obvious abnormalities. No apparent alopecia or balding. Eyes:  Conjunctiva clear bilaterally.  Ears:  TMs clear bilaterally Nose:  nares clear bilaterally Mouth:  Oral mucosa and oropharynx without lesions or exudates.  MMM Neck:  No deformities, masses, or tenderness noted.  no LAD Lungs:  Normal respiratory effort, chest expands symmetrically. Lungs are clear to auscultation, no crackles or wheezes.  Heart:  Normal rate and regular rhythm. S1 and S2 normal without gallop, murmur, click, rub or other extra sounds. Abdomen:  Bowel sounds positive,abdomen soft and non-tender without masses, organomegaly or hernias noted. Rectal:  No external abnormalities noted. Normal sphincter tone. No rectal masses or tenderness.  guaiac negative Prostate:  Prostate gland firm and smooth, no enlargement, nodularity, tenderness, mass, asymmetry or induration.  enlarged prostate (30-40gm) Msk:  No deformity or scoliosis noted of thoracic or lumbar spine.   Pulses:  2+ rad pulses bilaterally, brisk cap refill Extremities:  no pedal edema Neurologic:  CN  grossly intact, station and gait intact Skin:  Intact without suspicious lesions or rashes Psych:  normal affect, pleasant, conversant.  no significant anxiety/nervousness   Impression & Recommendations:  Problem # 1:  TINNITUS, CHRONIC (ICD-388.30) chronic per patient, states has had audiology eval in past, told may need hearing aid.  failed our test.  interested in re eval by audiology.  didn't seem to have difficulty with hearing on our visit.  Orders: Audiology (Audio)  Problem # 2:  HEALTH MAINTENANCE EXAM (ICD-V70.0) Reviewed preventive care protocols, scheduled due services, and updated immunizations.  Problem # 3:  SCREENING FOR MALIGNANT NEOPLASM, PROSTATE (ICD-V76.44) Stable PSA, DRE.  Problem # 4:  SCREENING, COLON CANCER (ICD-V76.51)  guaiac negative.  utd colon/egd.  colonosopy 2004 with polyps, diverticulae.  advised call Dr. Marina Goodell to find out when next one recommended as we don't have other records here.  Orders: Hemoccult Guaiac-1 spec.(in office) (82270)  Problem # 5:  PURE HYPERCHOLESTEROLEMIA (ICD-272.0) LDL 167 this month.  too hgih.  discussed healthy eating.  provided with low cholesterol diet today.  return in 4-6 mo after lifestyle changes, if not lowering, start statin.  dsicussed framingham risk of 9%, statin would lower this risk to about 6-7%. Labs Reviewed: SGOT: 25 (09/25/2010)   SGPT: 24 (09/25/2010)   HDL:41.40 (09/25/2010), 43.20 (09/28/2009)  LDL:120 (09/28/2009), 119 (05/17/2008)  Chol:234 (09/25/2010), 200 (09/28/2009)  Trig:164.0 (09/25/2010), 185.0 (09/28/2009)  Complete Medication List: 1)  Nexium 40 Mg Cpdr (Esomeprazole magnesium) 2)  Flonase 50 Mcg/act Susp (Fluticasone propionate) .... Spray 2 spray into both nostrils once a day 3)  Venlafaxine Hcl 37.5 Mg Tabs (Venlafaxine hcl) .... One daily for anxiety/bowels 4)  Ativan 1 Mg Tabs (Lorazepam) .... Take one by mouth two times a day as needed anxiety 5)  Multivitamins Tabs (Multiple  vitamin) .... Per report 6)  Vitamin D3 400 Unit Chew (Cholecalciferol) .... One daily 7)  Ranitidine Hcl 150 Mg Caps (Ranitidine hcl) .... One by mouth at bedtime  Patient Instructions: 1)  Return in 4-6 months for follow up on cholesterol levels. 2)  Return prior for cholesterol check [dLDL, 272.4]. 3)  Call Dr. Marina Goodell to ask about when next colonoscopy due. 4)  Hearing screen today.  we wil send you to audiologist for further evaluation. 5)  Low cholesterol diet provided today. 6)  Good to see you today, call clinic with quesitons.   Orders Added: 1)  Audiology [Audio] 2)  Est. Patient 40-64 years [99396] 3)  Hemoccult Guaiac-1 spec.(in office) [82270]    Current Allergies (reviewed today): AUGMENTIN   Prevention & Chronic Care Immunizations   Influenza vaccine: Fluvax 3+  (06/25/2002)    Tetanus booster: 01/10/2002: Td   Tetanus booster due: 01/11/2012    Pneumococcal vaccine: Not documented  Colorectal Screening   Hemoccult: Not documented    Colonoscopy: Not documented  Other Screening   PSA: 0.99  (09/25/2010)   Smoking status: never  (03/29/2010)  Lipids   Total Cholesterol: 234  (09/25/2010)   LDL: 120  (09/28/2009)   LDL Direct: 167.8  (09/25/2010)   HDL: 41.40  (09/25/2010)   Triglycerides: 164.0  (09/25/2010)    SGOT (AST): 25  (09/25/2010)  SGPT (ALT): 24  (09/25/2010)   Alkaline phosphatase: 87  (09/25/2010)   Total bilirubin: 0.4  (09/25/2010)  Self-Management Support :    Lipid self-management support: Not documented

## 2010-10-22 NOTE — Letter (Signed)
Summary: [facility]  [facility]   Imported By: Kassie Mends 10/16/2010 10:48:09  _____________________________________________________________________  External Attachment:    Type:   Image     Comment:   External Document  Appended Document: [facility]  bilateral sensorineural hearing loss with tinnitus under eval by audiology for hearing aides.  [name]  MD  October 16, 2010 12:07 PM   Clinical Lists Changes  Observations: Added new observation of PAST MED HX: Allergic rhinitis Anxiety GERD IBS hyperlipidemia B sensorineural hearing loss with tinnitus, rec hearing aid h/o ileitis after last colonsocopy (10/16/2010 12:06)     MLI_PICT  Sketch(1)      Past History:  Past Medical History: Allergic rhinitis Anxiety GERD IBS hyperlipidemia B sensorineural hearing loss with tinnitus, rec hearing aid h/o ileitis after last colonsocopy

## 2011-01-07 NOTE — Op Note (Signed)
NAME:  Benjamin Robles, Benjamin Robles NO.:  [id number]   MEDICAL RECORD NO.:  [id number]          PATIENT TYPE:  AMB   LOCATION:  [facility]                         FACILITY:  [facility]   PHYSICIAN:  Thornton Park. Daphine Deutscher, MD  DATE OF BIRTH:  Mar 17, 1956   DATE OF PROCEDURE:  DATE OF DISCHARGE:                               OPERATIVE REPORT   CCS NUMBER:  [phone]   PREOPERATIVE DIAGNOSIS:  Recurrent left inguinal hernia after previous  laparoscopic repair by Dr. __________ Clydie Braun.   POSTOPERATIVE DIAGNOSIS:  Recurrent direct left inguinal hernia.   PROCEDURE:  Left inguinal herniorrhaphy with Ethicon Ultrapro mesh   SURGEON:  Thornton Park. Daphine Deutscher, MD   ANESTHESIA:  General by LMA.   DESCRIPTION OF PROCEDURE:  The patient was taken to room four at [facility] surgical.  Given general anesthesia.  The abdomen was prepped with  Techni-Care draped sterilely.  I made a small oblique incision in the  left lower quadrant and carried this down to the external ring.  He had  a large bulge coming out through the external ring.  I incised the  external oblique fibers and __________ nerve involvement, mobilized this  cord and found a huge direct hernia.  I went ahead and reduced that into  his abdomen and then mobilized the internal oblique and sutured it down  to the inguinal ligament and Cooper's ligament to reduce this.  I then  repaired this with a piece of mesh cut to fit and sutured along the  inguinal ligament with a running 2-0 Prolene.  This was a piece of the  Ultrapro mesh.  It was cut and placed around the internal ring and cord  structures and sutured to itself with a horizontal mattress suture of 2-  0 Prolene.  It was tacked medially as well.  The external oblique was  then closed over this with 2-0 Vicryl.  The floor was injected with  Marcaine and closed with 4-0 Vicryl subcutaneously and with Benzoin and  Steri-Strips.  The patient tolerated the procedure well and was taken to  the  recovery room in satisfactory condition.  He will be given Percocet  for pain and will be followed up in the office in 3-4 weeks.      Thornton Park Daphine Deutscher, MD  Electronically Signed     MBM/MEDQ  D:  07/09/2007  T:  07/10/2007  Job:  [id number]   cc:   Arta Silence, MD  Fax: [phone]

## 2011-01-10 NOTE — Assessment & Plan Note (Signed)
[facility]                                 ON-CALL NOTE   IBRAHIMA, HOLBERG                          MRN:          [id number]  DATE:07/22/2006                            DOB:          1956/01/12    PHONE NUMBER:  [phone].   The patient has been on 2 medicines for reflux such as AcipHex and  Reglan, and was recently started on Effexor, I believe, for anxiety, and  today has had some episodes of chest pressure and squeezing, with pain  radiating to his left arm without significant associated symptoms.  This  occurred at work, lasted for a while, and then eased of partly, and then  came back again, and it is still persistent, although less.  He has no  associated symptoms at present.  No history of heart disease.  He is 55  years old.  I have recommended he be evaluated in the emergency room  because of new onset chest pain that is waxing and waning, and, if he  cannot get someone to drive or his the pain is persistent, he can call  [phone].     Neta Mends. Panosh, MD  Electronically Signed    WKP/MedQ  DD: 07/22/2006  DT: 07/23/2006  Job #: [id number]

## 2011-01-10 NOTE — Assessment & Plan Note (Signed)
[name]                         GASTROENTEROLOGY OFFICE NOTE   Benjamin, Robles                          MRN:          [id number]  DATE:08/13/2006                            DOB:          1956-03-23    HISTORY:  Benjamin Robles schedules himself for today's office visit.  He was  evaluated August 04, 2006 for abdominal complaints.  See that  dictation for details.  The clinical impression at that time was anxiety  with depression, as well intermittent exacerbation of irritable bowel  syndrome due to anxiety and depression.  At his request, we did screen  for celiac disease.  His tissue transglutaminase antibody was normal.  He was encouraged to continue with treatment of his anxiety and  depression with no specific planned GI followup.  He presents to my  office today complaining of intermittent problems with chills.  He  notes a chilling sensation in his back, legs and hands.  He states he  feels rundown and is having difficulty concentrating.  He reports having  2 loose stools in the morning, and wonders if his gastrointestinal tract  may be the source for his other complaints.  Otherwise, no new issues  from his last visit.   His medications remain AcipHex, Xanax, Effexor, Flonase and  multivitamin.   PHYSICAL EXAMINATION:  Finds an anxious, depressed-appearing gentleman,  in no acute distress.  Blood pressure is 100/70.  Heart rate is 88 and regular.  Weight is  174.8 pounds.  HEENT:  Sclerae anicteric.  ABDOMEN:  Soft without tenderness, masses or hernia.  Good bowel sounds  heard.   IMPRESSION:  1. Anxiety with depression.  This remains the patient's principal      problem.  2. History of irritable bowel syndrome, ongoing.  3. History reflux disease.  4. Mild nonspecific ileitis on remote colonoscopy.   RECOMMENDATIONS:  I told the patient today,as I did on the 11th, that I  did not feel his problems were primarily gastrointestinal in  origin. It  is more likely that any GI symptoms being experienced were likely  secondary to problems with anxiety and depression. I spoke with Dr.  Hetty Ely this morning to discuss Benjamin Robles  case. He will try to refer  the patient to a psychiatrist.  The patient should also resume his  general medical care with Dr. Hetty Ely. The patient is aware.     Wilhemina Bonito. Marina Goodell, MD  Electronically Signed    JNP/MedQ  DD: 08/13/2006  DT: 08/13/2006  Job #: [id number]   cc:   Arta Silence, MD

## 2011-01-10 NOTE — Assessment & Plan Note (Signed)
[facility]                         GASTROENTEROLOGY OFFICE NOTE   Benjamin Robles, Benjamin Robles                          MRN:          [id number]  DATE:08/04/2006                            DOB:          1956-03-13    REFERRING PHYSICIAN:  Arta Silence, MD   REASON FOR CONSULTATION:  Abdominal complaints.   HISTORY OF PRESENT ILLNESS:  This is a 55 year old white male with  chronic intermittent GI complaints dating back 10 years,  gastroesophageal reflux disease, irritable bowel syndrome, anxiety with  depression, and nonspecific mild ileitis on colonoscopy.  He has been  evaluated in this office on a number of occasions for different GI  complaints.  Prior upper endoscopies have been unremarkable.  Prior  colonoscopy normal.  Intubation of the ileum, however, revealed multiple  aphthous ulcers.  Biopsies showed nonspecific inflammation.  Inflammatory bowel disease markers were negative.  He was not anemic nor  did he have an elevated sedimentation rate.  He was last evaluated in  2006.  He was doing well until Thanksgiving, when he began to develop  problems with anxiety and depression.  Sensation of coldness, cold  feeling in his hands.  He developed a chest pressure and was referred to  the emergency room.  Apparently felt to have had a panic attack.  He saw  Dr. Hetty Ely, and was placed on combination of Xanax and Effexor.  He is  referred now with some abdominal complaints.  He states he feels like  his food does not digest.  When he was placed on metoclopramide he had  multiple loose stools.  After discontinuing metoclopramide his stools  have returned back to his baseline.  He denies nausea, vomiting,  significant abdominal pain.  He reports 2 bowel movements in the  morning, occasionally loose, no blood.  He has had some weight loss  since the problems with anxiety.  He feels that his recently initiated  anxiolytic and antidepressant are starting  to work.  He is here with his  wife, who wonders if he might have sprue.   MEDICATIONS:  1. Aciphex 20 mg daily.  2. Xanax 0.5 mg t.i.d.  3. Effexor 75 mg daily.  4. He also takes generic Flonase.   ALLERGIES:  He lists AUGMENTIN as an allergy, stating it makes him achy.   PHYSICAL EXAMINATION:  Finds an anxious, depressed-appearing male in no  acute distress.  His blood pressure 104/70, heart rate is 80, weight is 175 pounds.  HEENT:  Sclerae are anicteric, conjunctivae are pink, oral mucosa is  intact, there are no aphthous ulcers.  LUNGS:  Clear.  HEART:  Regular.  ABDOMEN:  Soft without tenderness, mass or hernia.  Good bowel sounds  heard.  EXTREMITIES:  Are without edema.   IMPRESSION:  1. Anxiety with depression.  This is the patient's principal problem.  2. History of irritable bowel syndrome.  Transient exacerbation with      stress and anxiety.  3. History of reflux disease.  Currently denying any symptoms off      proton pump inhibitor therapy.  4. Mild nonspecific ileitis on prior colonoscopy.   RECOMMENDATIONS:  1. Continue treatment of anxiety and depression through Dr. Hetty Ely.  2. The patient reports that his recent thyroid studies were normal.  I      have asked him to confirm this with Dr. Hetty Ely, as their office      is closed at this time and we see no record of thyroid testing in      the computer.  3. Check tissue transglutaminase antibody as a screening test for      sprue.  4. GI followup p.r.n.     Benjamin Robles. Marina Goodell, MD  Electronically Signed    JNP/MedQ  DD: 08/04/2006  DT: 08/05/2006  Job #: [id number]   cc:   Arta Silence, MD

## 2011-02-22 ENCOUNTER — Emergency Department: Payer: Self-pay | Admitting: Internal Medicine

## 2011-02-23 ENCOUNTER — Emergency Department: Payer: Self-pay | Admitting: Internal Medicine

## 2011-02-24 ENCOUNTER — Other Ambulatory Visit: Payer: Medicare HMO

## 2011-02-28 ENCOUNTER — Ambulatory Visit: Payer: Medicare HMO | Admitting: Family Medicine

## 2011-06-03 LAB — POCT HEMOGLOBIN-HEMACUE: Operator id: 268271

## 2011-06-05 ENCOUNTER — Encounter: Payer: Self-pay | Admitting: Internal Medicine

## 2011-06-07 ENCOUNTER — Other Ambulatory Visit: Payer: Self-pay | Admitting: Family Medicine

## 2011-06-28 ENCOUNTER — Other Ambulatory Visit: Payer: Self-pay | Admitting: Family Medicine

## 2011-07-03 ENCOUNTER — Encounter: Payer: Self-pay | Admitting: Internal Medicine

## 2011-07-08 ENCOUNTER — Encounter: Payer: Self-pay | Admitting: Internal Medicine

## 2011-07-14 ENCOUNTER — Ambulatory Visit (INDEPENDENT_AMBULATORY_CARE_PROVIDER_SITE_OTHER): Payer: Medicare HMO | Admitting: Internal Medicine

## 2011-07-14 ENCOUNTER — Encounter: Payer: Self-pay | Admitting: Internal Medicine

## 2011-07-14 VITALS — BP 110/72 | HR 60 | Ht 74.0 in | Wt 189.4 lb

## 2011-07-14 DIAGNOSIS — F419 Anxiety disorder, unspecified: Secondary | ICD-10-CM

## 2011-07-14 DIAGNOSIS — F411 Generalized anxiety disorder: Secondary | ICD-10-CM

## 2011-07-14 DIAGNOSIS — Z86010 Personal history of colonic polyps: Secondary | ICD-10-CM

## 2011-07-14 DIAGNOSIS — R141 Gas pain: Secondary | ICD-10-CM

## 2011-07-14 DIAGNOSIS — R1084 Generalized abdominal pain: Secondary | ICD-10-CM

## 2011-07-14 DIAGNOSIS — R143 Flatulence: Secondary | ICD-10-CM

## 2011-07-14 DIAGNOSIS — K219 Gastro-esophageal reflux disease without esophagitis: Secondary | ICD-10-CM

## 2011-07-14 MED ORDER — PEG-KCL-NACL-NASULF-NA ASC-C 100 G PO SOLR: 1.0000 | Freq: Once | ORAL | Status: DC

## 2011-07-14 NOTE — Patient Instructions (Signed)
You have been scheduled for an endoscopy and colonoscopy with propofol. Please follow the written instructions given to you at your visit today. Please pick up yourprep at the pharmacy within the next 2-3 days.  

## 2011-07-14 NOTE — Progress Notes (Signed)
HISTORY OF PRESENT ILLNESS:  Benjamin Robles is a 55 y.o. male with chronic intermittent GI complaints dating back greater than 15 years, GERD, IBS, anxiety with depression, and a nonspecific mild ileitis on colonoscopy in 2004. He presents today with vague GI complaints. He states that he eats small meals frequently. He feels like his food "does not digest". Tends to get full easily. No nausea or vomiting. He does complain of chronic bloating and belching. He reports loss of appetite. States that he forces himself to eat. However, no weight loss. No bleeding. He is concerned. Does have chronic GERD for which he takes PPI. On PPI, generally good control of symptoms, that he feels like he has had some breakthrough recently as manifested by pharyngeal congestion. Off PPI significant symptoms. No dysphagia. Last upper endoscopy performed was in July of 2006. This was normal. At that time, evaluating chest pain and GERD symptoms. His last colonoscopy, as stated, was in November of 2004. Mild ileitis and a diminutive colon polyp (no pathology) removed. He is due for followup at this time. He does state that his antidepressant therapy seems to help his abdominal complaints overall.  REVIEW OF SYSTEMS:  All non-GI ROS negative except for sinus and allergy cold, sweaty throat, sleeping problems, excessive urination  Past Medical History  Diagnosis Date  . Bradycardia   . Atrial fibrillation   . Aortic valve sclerosis   . Prostate cancer   . Anticoagulant disorder   . Gout   . Tubular adenoma   . Umbilical hernia   . GERD (gastroesophageal reflux disease)     Past Surgical History  Procedure Date  . Hernia repair     x2    Social History Benjamin Robles  reports that he has quit smoking. He has never used smokeless tobacco. He reports that he drinks alcohol. He reports that he does not use illicit drugs.  family history includes Depression in his father; Hypertension in his mother; and Prostate cancer  in his father.  Allergies  Allergen Reactions  . OZH:YQMVHQIONGE+XBMWUXLKG+MWNUUVOZDG Acid+Aspartame     REACTION: achey, itch       PHYSICAL EXAMINATION: Vital signs: BP 110/72  Pulse 60  Ht 6\' 2"  (1.88 m)  Wt 189 lb 6.4 oz (85.911 kg)  BMI 24.32 kg/m2  Constitutional: generally well-appearing, no acute distress Psychiatric: alert and oriented x3, cooperative. Anxious Eyes: extraocular movements intact, anicteric, conjunctiva pink Mouth: oral pharynx moist, no lesions Neck: supple no lymphadenopathy Cardiovascular: heart regular rate and rhythm, no murmur Lungs: clear to auscultation bilaterally Abdomen: soft, nontender, nondistended, no obvious ascites, no peritoneal signs, normal bowel sounds, no organomegaly Rectal: Deferred until colonoscopy Extremities: no lower extremity edema bilaterally Skin: no lesions on visible extremities Neuro: No focal deficits.   ASSESSMENT:  #1. Chronic abdominal complaints as manifested by early satiety with bloating. #2. GERD. Some breakthrough recently #3. History of diminutive colon polyp. If her followup #4. Nonspecific ileitis on colonoscopy in 2004. #5. Anxiety/depression  PLAN:  #1. Colonoscopy to provide followup surveillance. As well, evaluating the ileum for evidence of inflammatory bowel disease.The nature of the procedure, as well as the risks, benefits, and alternatives were carefully and thoroughly reviewed with the patient. Ample time for discussion and questions allowed. The patient understood, was satisfied, and agreed to proceed. Movi prep prescribed. The patient instructed on its use #2. Upper endoscopy. This to evaluate early satiety and breakthrough GERD.The nature of the procedure, as well as the risks, benefits, and alternatives  were carefully and thoroughly reviewed with the patient. Ample time for discussion and questions allowed. The patient understood, was satisfied, and agreed to proceed.  #3. Propofol sedation  with CRNA monitoring, given anxiety issues

## 2011-07-29 ENCOUNTER — Other Ambulatory Visit: Payer: Self-pay | Admitting: *Deleted

## 2011-07-29 MED ORDER — FLUTICASONE PROPIONATE 50 MCG/ACT NA SUSP: 2.0000 | Freq: Every day | NASAL | Status: DC

## 2011-07-29 NOTE — Telephone Encounter (Signed)
Rx resent to [facility] with 90 day supply and refills.

## 2011-07-29 NOTE — Telephone Encounter (Signed)
Patient called stating that refill sent to [facility] should have been for a 90 day supply which is 3 bottles and refiils.

## 2011-08-07 ENCOUNTER — Ambulatory Visit (AMBULATORY_SURGERY_CENTER): Payer: Medicare HMO | Admitting: Internal Medicine

## 2011-08-07 ENCOUNTER — Encounter: Payer: Self-pay | Admitting: Internal Medicine

## 2011-08-07 VITALS — BP 132/78 | HR 63 | Temp 97.0°F | Resp 20 | Ht 74.0 in | Wt 189.0 lb

## 2011-08-07 DIAGNOSIS — K219 Gastro-esophageal reflux disease without esophagitis: Secondary | ICD-10-CM

## 2011-08-07 DIAGNOSIS — K921 Melena: Secondary | ICD-10-CM

## 2011-08-07 DIAGNOSIS — R1084 Generalized abdominal pain: Secondary | ICD-10-CM

## 2011-08-07 DIAGNOSIS — K2940 Chronic atrophic gastritis without bleeding: Secondary | ICD-10-CM

## 2011-08-07 DIAGNOSIS — D131 Benign neoplasm of stomach: Secondary | ICD-10-CM

## 2011-08-07 DIAGNOSIS — Z86010 Personal history of colonic polyps: Secondary | ICD-10-CM

## 2011-08-07 DIAGNOSIS — R6881 Early satiety: Secondary | ICD-10-CM

## 2011-08-07 DIAGNOSIS — Z1211 Encounter for screening for malignant neoplasm of colon: Secondary | ICD-10-CM

## 2011-08-07 HISTORY — PX: COLONOSCOPY: SHX174

## 2011-08-07 HISTORY — PX: UPPER GASTROINTESTINAL ENDOSCOPY: SHX188

## 2011-08-07 MED ORDER — SODIUM CHLORIDE 0.9 % IV SOLN: 500.0000 mL | INTRAVENOUS | Status: DC

## 2011-08-07 NOTE — Progress Notes (Signed)
Addended by: Leilyn Frayre D on: 08/07/2011 01:11 PM   Modules accepted: Orders  

## 2011-08-07 NOTE — Op Note (Signed)
[facility] [address] [place], [place]  [address]  COLONOSCOPY PROCEDURE REPORT  PATIENT:  Benjamin, Robles  MR#:  [id number] BIRTHDATE:  [birth year], 55 yrs. old  GENDER:  male ENDOSCOPIST:  Rehan Holness. Eda Keys, MD REF. BY:  Office Self, M.D. PROCEDURE DATE:  08/07/2011 PROCEDURE:  Surveillance Colonoscopy ASA CLASS:  Class II INDICATIONS:  history of polyps, surveillance and high-risk screening, Abdominal pain ; index 2004 w/ nonspecific ileitis and diminutive polyp (no path); abdominal complaints (see OV) MEDICATIONS:   MAC sedation, administered by CRNA, propofol (Diprivan) 260 mg IV  DESCRIPTION OF PROCEDURE:   After the risks benefits and alternatives of the procedure were thoroughly explained, informed consent was obtained.  Digital rectal exam was performed and revealed no abnormalities.   The LB 180AL [id number] endoscope was introduced through the anus and advanced to the cecum, which was identified by both the appendix and ileocecal valve, without limitations.  The quality of the prep was excellent, using MoviPrep.  The instrument was then slowly withdrawn as the colon was fully examined. <<PROCEDUREIMAGES>>  FINDINGS:  The terminal ileum appeared normal.  A normal appearing cecum, ileocecal valve, and appendiceal orifice were identified. The ascending, hepatic flexure, transverse, splenic flexure, descending, sigmoid colon, and rectum appeared unremarkable.  No polyps or cancers were seen.   Retroflexed views in the rectum revealed no abnormalities.    The time to cecum = 4:25  minutes. The scope was then withdrawn in  10:25  minutes from the cecum and the procedure completed.  COMPLICATIONS:  None  ENDOSCOPIC IMPRESSION: 1) Normal terminal ileum 2) Normal colon 3) No polyps or cancers  RECOMMENDATIONS: 1) Continue current colorectal screening recommendations for "routine risk" patients with a repeat colonoscopy in 10 years. 2) Upper endoscopy  today  ______________________________ Wilhemina Bonito. Eda Keys, MD  CC:  [name] MD;   The Patient  n. eSIGNED:   Wilhemina Bonito. Eda Keys at 08/07/2011 10:17 AM  Chrisandra Carota, [phone]

## 2011-08-07 NOTE — Progress Notes (Signed)
Patient did not have preoperative order for IV antibiotic SSI prophylaxis. Patient did not experience any of the following events: a burn prior to discharge; a fall within the facility; wrong site/side/patient/procedure/implant event; or a hospital transfer or hospital admission upon discharge from the facility. (G8907) 

## 2011-08-07 NOTE — Op Note (Signed)
[facility] [address] [place], [place]  [address]  ENDOSCOPY PROCEDURE REPORT  PATIENT:  Benjamin Robles, Benjamin Robles  MR#:  [id number] BIRTHDATE:  Nov 09, 1955, 55 yrs. old  GENDER:  male  ENDOSCOPIST:  Derward Marple. Eda Keys, MD Referred by:  Office  PROCEDURE DATE:  08/07/2011 PROCEDURE:  EGD with biopsy, 43239, EGD for control of bleeding (Epi injection and Endo clip x 1) ASA CLASS:  Class II INDICATIONS:  early satiety, dyspepsia, GERD  MEDICATIONS:   MAC sedation, administered by CRNA, propofol (Diprivan) 190 mg IV TOPICAL ANESTHETIC:  none  DESCRIPTION OF PROCEDURE:   After the risks benefits and alternatives of the procedure were thoroughly explained, informed consent was obtained.  The LB GIF-H180 [id number] endoscope was introduced through the mouth and advanced to the second portion of the duodenum, without limitations.  The instrument was slowly withdrawn as the mucosa was fully examined. <<PROCEDUREIMAGES>>  The esophagus and gastroesophageal junction were completely normal in appearance.  A small actively oozing nodule was found in the cardia upon entry. The lesion was bx. Moderate oozing continued and it was elected to inject the area with Epinephrine [1:10,00] 2.5cc then endoclipped x 1. Complete hemostasis achieved.. Otherwise normal stomach.  The duodenal bulb was normal in appearance, as was the postbulbar duodenum.    Retroflexed views revealed no abnormalities.    The scope was then withdrawn from the patient and the procedure completed.  COMPLICATIONS:  None  ENDOSCOPIC IMPRESSION: 1) Normal esophagus 2) Oozing tiny Nodule in the cardia (see above) 3) Otherwise normal stomach 4) Normal duodenum 5) GI SYMPTOMS FELT SECONDARY TO STRESS  RECOMMENDATIONS: 1) Await biopsy results 2) RETURN TO THE CARE OF DR Sharen Hones  ______________________________ Wilhemina Bonito. Eda Keys, MD  CC:  [name] MD; The Patient  n. eSIGNED:   Wilhemina Bonito. Eda Keys at 08/07/2011  10:48 AM  Chrisandra Carota, [phone]

## 2011-08-07 NOTE — Patient Instructions (Signed)
Read the handouts given to you by your recovery room nurse.   Double your nexium for two weeks and call us if you need a refill.  Your biopsy results will be mailed to you within two weeks.   If you have any questions, call [phone].  Thank-you.

## 2011-08-08 ENCOUNTER — Telehealth: Payer: Self-pay | Admitting: *Deleted

## 2011-08-08 NOTE — Telephone Encounter (Signed)

## 2011-08-12 ENCOUNTER — Encounter: Payer: Self-pay | Admitting: Family Medicine

## 2011-08-17 ENCOUNTER — Encounter: Payer: Self-pay | Admitting: Family Medicine

## 2011-09-26 ENCOUNTER — Other Ambulatory Visit: Payer: Self-pay | Admitting: Family Medicine

## 2012-05-21 ENCOUNTER — Ambulatory Visit (INDEPENDENT_AMBULATORY_CARE_PROVIDER_SITE_OTHER): Payer: Medicare HMO | Admitting: Family Medicine

## 2012-05-21 ENCOUNTER — Encounter: Payer: Self-pay | Admitting: Family Medicine

## 2012-05-21 VITALS — BP 132/88 | HR 70 | Temp 98.6°F | Wt 191.0 lb

## 2012-05-21 DIAGNOSIS — M948X9 Other specified disorders of cartilage, unspecified sites: Secondary | ICD-10-CM

## 2012-05-21 DIAGNOSIS — M89319 Hypertrophy of bone, unspecified shoulder: Secondary | ICD-10-CM

## 2012-05-21 DIAGNOSIS — S39012A Strain of muscle, fascia and tendon of lower back, initial encounter: Secondary | ICD-10-CM

## 2012-05-21 DIAGNOSIS — S335XXA Sprain of ligaments of lumbar spine, initial encounter: Secondary | ICD-10-CM

## 2012-05-21 MED ORDER — CYCLOBENZAPRINE HCL 10 MG PO TABS: 10.0000 mg | ORAL_TABLET | Freq: Two times a day (BID) | ORAL | Status: DC | PRN

## 2012-05-21 NOTE — Patient Instructions (Signed)
I think you have lumbar strain. Treat with tylenol, flexeril as needed (may make you sleepy), and stretching exercises provided today. If not improving, let me know. Keep an eye on clavicle on right - if not improving, let us know for further imaging.

## 2012-05-21 NOTE — Assessment & Plan Note (Signed)
States has had normal xray in last few weeks. Monitor for now.  If not resolving, low threshold to reimage, further evaluate.

## 2012-05-21 NOTE — Assessment & Plan Note (Signed)
No red flags. Reassured. Treat as lumbar strain.  Update if not improving.  See pt instructions.

## 2012-05-21 NOTE — Progress Notes (Signed)
  Subjective:    Patient ID: Benjamin Robles, male    DOB: 1955/09/04, 56 y.o.   MRN: [id number]  HPI CC: LBP  1d h/o lower back pain.  Sees Dr. Anner Crete intermittently for adjustments (chiropractor).  Last visit was last week.  Had adjustment to upper back, then mid back started bothering him.  Yesterday lower back started hurting, woke him up from sleep last night.  Staying middle of lower back.    Upcoming vacation.  Has not tried any meds for this other than tylenol/ibuprofen.  Relatively new pillow top mattress.  Sleeps on 1 pillow at night.  No fevers/chills, nausea/vomiting, radiculopathy down legs, numbness/weakness of legs, bowel/bladder accidents or changes, or inciting trauma/injury.  No h/o back surgery.  Stopped effexor, more loose stools but tolerating well.  Also R clavicle head enlarged for last 3 wks.  States chiro had xray done and reviewed with radiology, no concerns noted.  Past Medical History  Diagnosis Date  . Bradycardia   . Anticoagulant disorder   . Tubular adenoma   . Umbilical hernia   . GERD (gastroesophageal reflux disease)   . Allergy     SINUS ALLEGIES  . Anxiety   . Depression    Past Surgical History  Procedure Date  . Hernia repair     x2  . Colonoscopy 08/07/2011    WNL, rpt 10 yrs  . Upper gastrointestinal endoscopy 08/07/2011    chronic superficial gastritis, oozing nodule clipped, neg H pylori    Review of Systems Per HPI    Objective:   Physical Exam  Nursing note and vitals reviewed. Constitutional: He appears well-developed and well-nourished. No distress.  Musculoskeletal: Normal range of motion. He exhibits no edema.       Head of R clavicle enlarged compared to left. No midline spine tenderness, no paraspinous mm tenderness. Neg SLR bilaterally. No pain with int/ext rotation at hip Neg faber test. No pain with palpation at SIJ, GTB, sciatic notch  Neurological: He is alert. He has normal strength. No sensory deficit.    Reflex Scores:      Patellar reflexes are 2+ on the right side and 2+ on the left side.      Achilles reflexes are 2+ on the right side and 2+ on the left side.      Sensation intact BLE Strength 5/5 BLE.  Skin: Skin is warm and dry. No rash noted.       Assessment & Plan:

## 2012-06-18 ENCOUNTER — Other Ambulatory Visit: Payer: Self-pay | Admitting: Family Medicine

## 2012-06-28 ENCOUNTER — Ambulatory Visit (INDEPENDENT_AMBULATORY_CARE_PROVIDER_SITE_OTHER): Payer: Medicare HMO | Admitting: Family Medicine

## 2012-06-28 ENCOUNTER — Encounter: Payer: Self-pay | Admitting: Family Medicine

## 2012-06-28 ENCOUNTER — Ambulatory Visit (INDEPENDENT_AMBULATORY_CARE_PROVIDER_SITE_OTHER)
Admission: RE | Admit: 2012-06-28 | Discharge: 2012-06-28 | Disposition: A | Discharge status: Home / Self Care | Payer: Medicare HMO | Source: Ambulatory Visit | Attending: Family Medicine | Admitting: Family Medicine

## 2012-06-28 VITALS — BP 116/80 | HR 76 | Temp 98.5°F | Wt 189.0 lb

## 2012-06-28 DIAGNOSIS — M89319 Hypertrophy of bone, unspecified shoulder: Secondary | ICD-10-CM

## 2012-06-28 DIAGNOSIS — M948X9 Other specified disorders of cartilage, unspecified sites: Secondary | ICD-10-CM

## 2012-06-28 NOTE — Patient Instructions (Signed)
xrays looking ok today. We will call you with next step.

## 2012-06-28 NOTE — Assessment & Plan Note (Addendum)
Continues to bother him. Will obtain 2 view chest as well as dedicated R clavicle for further evaluation. Clear on my read - will await radiology read. Consider clavicle MRI.

## 2012-06-28 NOTE — Progress Notes (Signed)
  Subjective:    Patient ID: Benjamin Robles, male    DOB: 1956/05/17, 56 y.o.   MRN: [id number]  HPI [id number] enlarging clavicle.  R clavicle head enlarging for 3 wks at last visit (05/21/2012).  States chiro had xrays done and reviewed with radiology, no concerns noted.  Feels may have enlarged some.  Occasionally tender and snapping with movement of R shoulder.  Occasionally feels R superior shoulder pain as well.  Feels irritation mid clavicle.  Has tried tylenol for back pain, has not helped clavicle.  Denies skin change - redness or warmth.  Not really worsening, but not improving either.  Not growing in size.  Sleeps on R side.  No fevers/chills, weight loss. Wt Readings from Last 3 Encounters:  06/28/12 189 lb (85.73 kg)  05/21/12 191 lb (86.637 kg)  08/07/11 189 lb (85.73 kg)    Review of Systems Per HPI    Objective:   Physical Exam  Nursing note and vitals reviewed. Constitutional: He appears well-developed and well-nourished. No distress.  Cardiovascular: Normal rate, regular rhythm, normal heart sounds and intact distal pulses.   No murmur heard. Pulmonary/Chest: Effort normal and breath sounds normal. No respiratory distress. He has no wheezes. He has no rales.  Musculoskeletal:       Head of R clavicle enlarged compared to left. No significant tenderness to palpation along bilateral clavicles. Neg arm crossover test testing AC joint. FROM at shoulders.  Skin: Skin is warm and dry. No rash noted.  Psychiatric: He has a normal mood and affect.      Assessment & Plan:  Recommended schedule CPE as due, come in prior fasting for blood work.

## 2012-06-29 ENCOUNTER — Other Ambulatory Visit: Payer: Self-pay | Admitting: Family Medicine

## 2012-06-29 DIAGNOSIS — M89319 Hypertrophy of bone, unspecified shoulder: Secondary | ICD-10-CM

## 2012-07-03 ENCOUNTER — Ambulatory Visit
Admission: RE | Admit: 2012-07-03 | Discharge: 2012-07-03 | Disposition: A | Discharge status: Home / Self Care | Payer: Medicare HMO | Source: Ambulatory Visit | Attending: Family Medicine | Admitting: Family Medicine

## 2012-07-03 ENCOUNTER — Other Ambulatory Visit: Payer: Self-pay | Admitting: Family Medicine

## 2012-07-03 DIAGNOSIS — M89319 Hypertrophy of bone, unspecified shoulder: Secondary | ICD-10-CM

## 2012-07-05 ENCOUNTER — Other Ambulatory Visit: Payer: Self-pay | Admitting: *Deleted

## 2012-07-05 ENCOUNTER — Other Ambulatory Visit: Payer: Self-pay | Admitting: Family Medicine

## 2012-07-05 MED ORDER — DICLOFENAC SODIUM 1 % TD GEL: 1.0000 "application " | Freq: Four times a day (QID) | TRANSDERMAL | Status: DC | PRN

## 2012-07-06 ENCOUNTER — Other Ambulatory Visit (INDEPENDENT_AMBULATORY_CARE_PROVIDER_SITE_OTHER): Payer: Medicare HMO

## 2012-07-06 ENCOUNTER — Other Ambulatory Visit: Payer: Self-pay | Admitting: Family Medicine

## 2012-07-06 DIAGNOSIS — E7800 Pure hypercholesterolemia, unspecified: Secondary | ICD-10-CM

## 2012-07-06 DIAGNOSIS — K219 Gastro-esophageal reflux disease without esophagitis: Secondary | ICD-10-CM

## 2012-07-06 DIAGNOSIS — Z125 Encounter for screening for malignant neoplasm of prostate: Secondary | ICD-10-CM

## 2012-07-06 LAB — BASIC METABOLIC PANEL
BUN: 9 mg/dL (ref 6–23)
Chloride: 102 mEq/L (ref 96–112)
Creatinine, Ser: 1.1 mg/dL (ref 0.4–1.5)
Glucose, Bld: 99 mg/dL (ref 70–99)
Potassium: 4.3 mEq/L (ref 3.5–5.1)

## 2012-07-06 LAB — LIPID PANEL
Cholesterol: 203 mg/dL — ABNORMAL HIGH (ref 0–200)
VLDL: 29.2 mg/dL (ref 0.0–40.0)

## 2012-07-06 LAB — PSA: PSA: 0.85 ng/mL (ref 0.10–4.00)

## 2012-07-13 ENCOUNTER — Ambulatory Visit (INDEPENDENT_AMBULATORY_CARE_PROVIDER_SITE_OTHER): Payer: Medicare HMO | Admitting: Family Medicine

## 2012-07-13 ENCOUNTER — Encounter: Payer: Self-pay | Admitting: Family Medicine

## 2012-07-13 VITALS — BP 124/82 | HR 80 | Temp 98.2°F | Ht 74.0 in | Wt 190.8 lb

## 2012-07-13 DIAGNOSIS — Z0000 Encounter for general adult medical examination without abnormal findings: Secondary | ICD-10-CM | POA: Insufficient documentation

## 2012-07-13 DIAGNOSIS — Z0001 Encounter for general adult medical examination with abnormal findings: Secondary | ICD-10-CM | POA: Insufficient documentation

## 2012-07-13 NOTE — Assessment & Plan Note (Signed)
Preventative protocols reviewed and updated unless pt declined. Discussed healthy diet and lifestyle changes.

## 2012-07-13 NOTE — Progress Notes (Signed)
Subjective:    Patient ID: Benjamin Robles, male    DOB: 27-Sep-1955, 56 y.o.   MRN: [id number]  HPI CC: CPE  R sternoclavicular joint stays achey but tolerable.  Hasn't really noted change with voltaren gel.  Preventative: Colonoscopy 07/2011 - WNL rec rpt 10 yrs ([facility]) Prostate cancer screening - father with h/o slow growing prostate cancer.  Has had normal DRE/PSA in past. Flu shot - 2013 at work Tetanus shot - states done at hospital around 2010  Lives with wife, 2 cats and 2 dogs, grown children Occupation: Orthoptist support Activity: no regular exercise routine Diet: good water, fruits/vegetables daily  Medications and allergies reviewed and updated in chart.  Past histories reviewed and updated if relevant as below. Patient Active Problem List  Diagnosis  . PURE HYPERCHOLESTEROLEMIA  . ANXIETY DEPRESSION  . ALLERGIC RHINITIS  . GERD  . IRRITABLE BOWEL SYNDROME  . TINNITUS, CHRONIC  . Lumbar strain  . Clavicle enlargement   Past Medical History  Diagnosis Date  . Bradycardia   . Anticoagulant disorder   . Tubular adenoma   . Umbilical hernia   . GERD (gastroesophageal reflux disease)   . Allergy     SINUS ALLEGIES  . Anxiety   . Depression   . Pain of right sternoclavicular joint     MRI 06/2012 showing ?erosive arthritis   Past Surgical History  Procedure Date  . Hernia repair     x2  . Colonoscopy 08/07/2011    WNL, rpt 10 yrs  . Upper gastrointestinal endoscopy 08/07/2011    chronic superficial gastritis, oozing nodule clipped, neg H pylori   History  Substance Use Topics  . Smoking status: Former Games developer  . Smokeless tobacco: Never Used  . Alcohol Use: Yes   Family History  Problem Relation Age of Onset  . Depression Father   . Prostate cancer Father   . Hypertension Mother    Allergies  Allergen Reactions  . Amoxicillin-Pot Clavulanate     REACTION: achey, itch   Current Outpatient Prescriptions on File Prior to Visit  Medication  Sig Dispense Refill  . calcium carbonate (TUMS - DOSED IN MG ELEMENTAL CALCIUM) 500 MG chewable tablet Chew 1 tablet by mouth as needed.        . Cholecalciferol (VITAMIN D-3) 5000 UNITS TABS Take 1 tablet by mouth daily.        . diclofenac sodium (VOLTAREN) 1 % GEL Apply 1 application topically 4 (four) times daily as needed.  1 Tube  1  . fluticasone (FLONASE) 50 MCG/ACT nasal spray Place 2 sprays into the nose daily.  48 g  3  . multivitamin (THERAGRAN) per tablet Take 1 tablet by mouth daily.        Marland Kitchen NEXIUM 40 MG capsule TAKE 1 CAPSULE DAILY FOR REFLUX  90 capsule  3  . psyllium (METAMUCIL) 58.6 % powder Take 1 packet by mouth daily as needed.          Review of Systems  Constitutional: Negative for fever, chills, activity change, appetite change, fatigue and unexpected weight change.  HENT: Negative for hearing loss and neck pain.   Eyes: Negative for visual disturbance.  Respiratory: Negative for cough, chest tightness, shortness of breath and wheezing.   Cardiovascular: Negative for chest pain, palpitations and leg swelling.  Gastrointestinal: Negative for nausea, vomiting, abdominal pain, diarrhea, constipation, blood in stool and abdominal distention.  Genitourinary: Negative for hematuria and difficulty urinating.  Musculoskeletal: Negative for myalgias and  arthralgias.  Skin: Negative for rash.  Neurological: Negative for dizziness, seizures, syncope and headaches.  Hematological: Does not bruise/bleed easily.  Psychiatric/Behavioral: Negative for dysphoric mood. The patient is not nervous/anxious.        Objective:   Physical Exam  Nursing note and vitals reviewed. Constitutional: He is oriented to person, place, and time. He appears well-developed and well-nourished. No distress.  HENT:  Head: Normocephalic and atraumatic.  Right Ear: Hearing, tympanic membrane, external ear and ear canal normal.  Left Ear: Hearing, tympanic membrane, external ear and ear canal normal.   Nose: Nose normal.  Mouth/Throat: Oropharynx is clear and moist. No oropharyngeal exudate.  Eyes: Conjunctivae normal and EOM are normal. Pupils are equal, round, and reactive to light. No scleral icterus.  Neck: Normal range of motion. Neck supple.  Cardiovascular: Normal rate, regular rhythm, normal heart sounds and intact distal pulses.   No murmur heard. Pulses:      Radial pulses are 2+ on the right side, and 2+ on the left side.  Pulmonary/Chest: Effort normal and breath sounds normal. No respiratory distress. He has no wheezes. He has no rales.  Abdominal: Soft. Bowel sounds are normal. He exhibits no distension and no mass. There is no tenderness. There is no rebound and no guarding.  Genitourinary: Rectum normal. Rectal exam shows no external hemorrhoid, no internal hemorrhoid, no fissure, no mass, no tenderness and anal tone normal. Prostate is enlarged (~25gm). Prostate is not tender.  Musculoskeletal: Normal range of motion. He exhibits no edema.  Lymphadenopathy:    He has no cervical adenopathy.  Neurological: He is alert and oriented to person, place, and time.       CN grossly intact, station and gait intact  Skin: Skin is warm and dry. No rash noted.  Psychiatric: He has a normal mood and affect. His behavior is normal. Judgment and thought content normal.       Assessment & Plan:

## 2012-07-13 NOTE — Patient Instructions (Signed)
Good to see you today, call us with questions. Work on diet and exercise.  More fruits/vegetables daily. Return in 1 year for next physical

## 2012-08-19 ENCOUNTER — Other Ambulatory Visit: Payer: Self-pay | Admitting: Family Medicine

## 2012-10-18 ENCOUNTER — Telehealth: Payer: Self-pay | Admitting: *Deleted

## 2012-10-18 NOTE — Telephone Encounter (Signed)
Nexium no longer covered by insurance. Form with covered alternatives in your IN box.

## 2012-10-20 NOTE — Telephone Encounter (Signed)
Can we obtain prior auth for this? Failed omeprazole and aciphex bid. sxs of GERD (530.81) controlled on nexium

## 2012-10-21 ENCOUNTER — Encounter: Payer: Self-pay | Admitting: *Deleted

## 2012-10-21 NOTE — Telephone Encounter (Signed)
Sent patient a message to get current insurance info. No info in chart since 2012. Will await response.

## 2012-10-26 ENCOUNTER — Other Ambulatory Visit: Payer: Self-pay | Admitting: Family Medicine

## 2012-10-26 MED ORDER — ESOMEPRAZOLE MAGNESIUM 40 MG PO CPDR: DELAYED_RELEASE_CAPSULE | ORAL | Status: DC

## 2012-10-26 NOTE — Telephone Encounter (Signed)
Pt was notified that [facility] was having issues processing his refill and there will be a delay.  He was advised to ask for a 7 day supply until nexium rx arrives.

## 2012-10-28 NOTE — Telephone Encounter (Signed)
PA in your IN box for completion. 

## 2012-10-28 NOTE — Telephone Encounter (Signed)
Joni Reining with [facility] left v/m requesting nexium determination for coverage; covered alternatives are omeprazole, pantoprazole and lansoprazole. Call from 9 am-5:30 pm with ref # [id number].Please advise.

## 2012-10-28 NOTE — Telephone Encounter (Signed)
Filled and placed in Kim's box. 

## 2012-11-02 ENCOUNTER — Telehealth: Payer: Self-pay

## 2012-11-02 MED ORDER — ESOMEPRAZOLE MAGNESIUM 40 MG PO CPDR: DELAYED_RELEASE_CAPSULE | ORAL | Status: DC

## 2012-11-02 NOTE — Telephone Encounter (Signed)
Pt wanted to ck status of Nexium PA. Advised pt received approval letter from [facility] for Nexium. Pt also request # 7 Nexium(that is all insurance will allow locally at one time) to [address]advised pt done.

## 2012-11-02 NOTE — Telephone Encounter (Signed)
nexium approval letter received and placed in Dr Timoteo Expose in box for signature and scanning.

## 2012-11-03 ENCOUNTER — Other Ambulatory Visit: Payer: Self-pay | Admitting: *Deleted

## 2012-11-03 MED ORDER — ESOMEPRAZOLE MAGNESIUM 40 MG PO CPDR: DELAYED_RELEASE_CAPSULE | ORAL | Status: DC

## 2012-11-03 NOTE — Telephone Encounter (Signed)
PA approved. Rx called into [facility] and they were notified of approval.

## 2013-06-30 ENCOUNTER — Other Ambulatory Visit: Payer: Self-pay

## 2013-07-15 ENCOUNTER — Ambulatory Visit (INDEPENDENT_AMBULATORY_CARE_PROVIDER_SITE_OTHER): Payer: Medicare HMO | Admitting: Family Medicine

## 2013-07-15 ENCOUNTER — Encounter: Payer: Self-pay | Admitting: Family Medicine

## 2013-07-15 VITALS — BP 144/90 | HR 84 | Temp 98.5°F | Wt 188.5 lb

## 2013-07-15 DIAGNOSIS — J309 Allergic rhinitis, unspecified: Secondary | ICD-10-CM

## 2013-07-15 NOTE — Progress Notes (Signed)
Pre-visit discussion using our clinic review tool. No additional management support is needed unless otherwise documented below in the visit note.  

## 2013-07-15 NOTE — Assessment & Plan Note (Signed)
Anticipate residual sinus irritation from initial chest cold, but exam overall WNL. Doubt bacterial infection. Treat with supportive care as per instructions. Update if sxs persist or worsen - red flags to return discussed.

## 2013-07-15 NOTE — Progress Notes (Signed)
  Subjective:    Patient ID: Benjamin Robles, male    DOB: 10/27/1955, 57 y.o.   MRN: [id number]  HPI CC: ?URI  Several week h/o chest cold since trip to [place].  Occasional muscle aches, back ache, mild sinus drainage, mild cough with chest tightness.  No wheezing.  Sore tongue.  Mild dyspnea compared to normal.  Mild PNDrainage.  No fevers/chills, abd pain, n/v, ear or tooth pain, headache.  No ST.  Tried OTC cold/flu medicine.   flonase is controlling sinus and allergies well. GERD - on nexium for this. Wife sick recently with lingering sinus infection. No smokers at home.  Past Medical History  Diagnosis Date  . Bradycardia   . Anticoagulant disorder   . Tubular adenoma     normal colonoscopy 2012  . Umbilical hernia   . GERD (gastroesophageal reflux disease)   . Allergy     SINUS ALLEGIES  . Anxiety   . Depression   . Pain of right sternoclavicular joint     MRI 06/2012 showing ?erosive arthritis  . BPH (benign prostatic hypertrophy)     mild     Review of Systems Per HPI    Objective:   Physical Exam  Nursing note and vitals reviewed. Constitutional: He appears well-developed and well-nourished. No distress.  HENT:  Head: Normocephalic and atraumatic.  Right Ear: Hearing, tympanic membrane, external ear and ear canal normal.  Left Ear: Hearing, tympanic membrane, external ear and ear canal normal.  Nose: Nose normal. No mucosal edema or rhinorrhea. Right sinus exhibits no maxillary sinus tenderness and no frontal sinus tenderness. Left sinus exhibits no maxillary sinus tenderness and no frontal sinus tenderness.  Mouth/Throat: Uvula is midline, oropharynx is clear and moist and mucous membranes are normal. No oropharyngeal exudate, posterior oropharyngeal edema, posterior oropharyngeal erythema or tonsillar abscesses.  Eyes: Conjunctivae and EOM are normal. Pupils are equal, round, and reactive to light. No scleral icterus.  Neck: Normal range of motion. Neck supple.   Cardiovascular: Normal rate, regular rhythm, normal heart sounds and intact distal pulses.   No murmur heard. Pulmonary/Chest: Effort normal and breath sounds normal. No respiratory distress. He has no wheezes. He has no rales.  Lymphadenopathy:    He has no cervical adenopathy.  Skin: Skin is warm and dry. No rash noted.       Assessment & Plan:

## 2013-07-15 NOTE — Patient Instructions (Signed)
I think you have residual irritation of sinuses but no infection (possibly from allergies). Continue flonase. Start daily antihistamine like claritin or zyrtec. Start daily plain mucinex or immediate release guaifenesin with plenty of water to help mobilize mucouse. I don't think there's bacterial infection currently. Watch for worsening , or fever >101, or worsening productive cough.  Let me know if this happens. Return in next few months for fasting blood work and afterwards for physical.

## 2013-09-06 ENCOUNTER — Other Ambulatory Visit: Payer: Self-pay | Admitting: *Deleted

## 2013-09-06 MED ORDER — FLUTICASONE PROPIONATE 50 MCG/ACT NA SUSP: NASAL | Status: DC

## 2013-09-06 MED ORDER — ESOMEPRAZOLE MAGNESIUM 40 MG PO CPDR: DELAYED_RELEASE_CAPSULE | ORAL | Status: DC

## 2013-09-08 ENCOUNTER — Other Ambulatory Visit: Payer: Self-pay | Admitting: *Deleted

## 2013-09-18 ENCOUNTER — Other Ambulatory Visit: Payer: Self-pay | Admitting: Family Medicine

## 2013-09-18 DIAGNOSIS — Z125 Encounter for screening for malignant neoplasm of prostate: Secondary | ICD-10-CM

## 2013-09-18 DIAGNOSIS — E7800 Pure hypercholesterolemia, unspecified: Secondary | ICD-10-CM

## 2013-09-20 ENCOUNTER — Other Ambulatory Visit (INDEPENDENT_AMBULATORY_CARE_PROVIDER_SITE_OTHER): Payer: Medicare HMO

## 2013-09-20 DIAGNOSIS — Z0000 Encounter for general adult medical examination without abnormal findings: Secondary | ICD-10-CM

## 2013-09-20 DIAGNOSIS — Z125 Encounter for screening for malignant neoplasm of prostate: Secondary | ICD-10-CM

## 2013-09-20 DIAGNOSIS — E7800 Pure hypercholesterolemia, unspecified: Secondary | ICD-10-CM

## 2013-09-20 LAB — LIPID PANEL
Cholesterol: 199 mg/dL (ref 0–200)
HDL: 38.3 mg/dL — ABNORMAL LOW (ref >39.00)
LDL Cholesterol: 131 mg/dL — ABNORMAL HIGH (ref 0–99)
Total CHOL/HDL Ratio: 5
Triglycerides: 151 mg/dL — ABNORMAL HIGH (ref 0.0–149.0)
VLDL: 30.2 mg/dL (ref 0.0–40.0)

## 2013-09-20 LAB — COMPREHENSIVE METABOLIC PANEL
ALK PHOS: 72 U/L (ref 39–117)
ALT: 14 U/L (ref 0–53)
AST: 19 U/L (ref 0–37)
Albumin: 4.2 g/dL (ref 3.5–5.2)
BUN: 10 mg/dL (ref 6–23)
CHLORIDE: 107 meq/L (ref 96–112)
CO2: 30 mEq/L (ref 19–32)
Calcium: 9.4 mg/dL (ref 8.4–10.5)
Creatinine, Ser: 1.1 mg/dL (ref 0.4–1.5)
GFR: 70.31 mL/min (ref >60.00)
GLUCOSE: 99 mg/dL (ref 70–99)
POTASSIUM: 4.4 meq/L (ref 3.5–5.1)
SODIUM: 143 meq/L (ref 135–145)
TOTAL PROTEIN: 6.8 g/dL (ref 6.0–8.3)
Total Bilirubin: 1 mg/dL (ref 0.3–1.2)

## 2013-09-20 LAB — PSA: PSA: 0.87 ng/mL (ref 0.10–4.00)

## 2013-09-22 ENCOUNTER — Encounter: Payer: Self-pay | Admitting: Family Medicine

## 2013-09-22 ENCOUNTER — Ambulatory Visit (INDEPENDENT_AMBULATORY_CARE_PROVIDER_SITE_OTHER): Payer: Medicare HMO | Admitting: Family Medicine

## 2013-09-22 VITALS — BP 104/80 | HR 65 | Temp 98.2°F | Ht 74.0 in | Wt 188.2 lb

## 2013-09-22 DIAGNOSIS — N400 Enlarged prostate without lower urinary tract symptoms: Secondary | ICD-10-CM

## 2013-09-22 DIAGNOSIS — F341 Dysthymic disorder: Secondary | ICD-10-CM

## 2013-09-22 DIAGNOSIS — Z0000 Encounter for general adult medical examination without abnormal findings: Secondary | ICD-10-CM

## 2013-09-22 DIAGNOSIS — K219 Gastro-esophageal reflux disease without esophagitis: Secondary | ICD-10-CM

## 2013-09-22 DIAGNOSIS — E7800 Pure hypercholesterolemia, unspecified: Secondary | ICD-10-CM

## 2013-09-22 NOTE — Assessment & Plan Note (Signed)
Persistent sxs on nexium.  No changes today.

## 2013-09-22 NOTE — Assessment & Plan Note (Signed)
Discussed sxs and side effects of meds.

## 2013-09-22 NOTE — Progress Notes (Signed)
Subjective:    Patient ID: Benjamin Robles, male    DOB: 1956-06-28, 58 y.o.   MRN: [id number]  HPI CC: annual exam  Worsening GERD last few weeks.  Has tried ranitidine nightly which hasn't helped.  Preventative:  Colonoscopy 07/2011 - WNL rec rpt 10 yrs [name])  Prostate cancer screening - father with h/o slow growing prostate cancer. Has had normal DRE/PSA in past.  H/o BPH with some frequency and nocturia x0-1. Flu shot - 05/2013 Tetanus shot - states done at hospital around 2010   Lives with wife, 2 cats and 2 dogs, grown children  Occupation: phone technical support  Activity: no regular exercise routine  Diet: good water, fruits/vegetables daily  Medications and allergies reviewed and updated in chart.  Past histories reviewed and updated if relevant as below. Patient Active Problem List   Diagnosis Date Noted  . Healthcare maintenance 07/13/2012  . Lumbar strain 05/21/2012  . 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 TINNITUS, CHRONIC 09/30/2010  . IRRITABLE BOWEL SYNDROME 03/29/2010  . PURE HYPERCHOLESTEROLEMIA 08/10/2007  . ANXIETY DEPRESSION 05/12/2007  . ALLERGIC RHINITIS 05/03/2007  . GERD 05/03/2007   Past Medical History  Diagnosis Date  . Bradycardia   . Anticoagulant disorder   . History of colon polyps     normal colonoscopy 2012  . Umbilical hernia   . GERD (gastroesophageal reflux disease)   . Allergy     SINUS ALLEGIES  . Anxiety   . Depression   . Pain of right sternoclavicular joint     MRI 06/2012 showing ?erosive arthritis  . BPH (benign prostatic hypertrophy)     mild   Past Surgical History  Procedure Laterality Date  . Hernia repair      x2  . Colonoscopy  08/07/2011    WNL, rpt 10 yrs  . Upper gastrointestinal endoscopy  08/07/2011    chronic superficial gastritis, oozing nodule clipped, neg H pylori   History  Substance Use Topics  . Smoking status: Former Research scientist (life sciences)  . Smokeless tobacco: Never Used  . Alcohol Use: Yes     Comment: 1  beer/day   Family History  Problem Relation Age of Onset  . Depression Father   . Prostate cancer Father 60    slow growing  . Hypertension Mother    Allergies  Allergen Reactions  . Amoxicillin-Pot Clavulanate     REACTION: achey, itch   Current Outpatient Prescriptions on File Prior to Visit  Medication Sig Dispense Refill  . calcium carbonate (TUMS - DOSED IN MG ELEMENTAL CALCIUM) 500 MG chewable tablet Chew 1 tablet by mouth as needed.        . Cholecalciferol (VITAMIN D-3) 5000 UNITS TABS Take 1 tablet by mouth daily.        Marland Kitchen esomeprazole (NEXIUM) 40 MG capsule TAKE 1 CAPSULE DAILY FOR REFLUX  90 capsule  3  . fluticasone (FLONASE) 50 MCG/ACT nasal spray USE 2 SPRAYS NASALY DAILY  48 g  3  . multivitamin (THERAGRAN) per tablet Take 1 tablet by mouth daily.        . psyllium (METAMUCIL) 58.6 % powder Take 1 packet by mouth daily as needed.        No current facility-administered medications on file prior to visit.    Review of Systems  Constitutional: Negative for fever, chills, activity change, appetite change, fatigue and unexpected weight change.  HENT: Negative for hearing loss.   Eyes: Negative for visual disturbance.  Respiratory: Negative for cough, chest tightness,  shortness of breath and wheezing.   Cardiovascular: Negative for chest pain, palpitations and leg swelling.  Gastrointestinal: Positive for abdominal pain (stays with upset stomach with gerd sxs, on nexium, tums.). Negative for nausea, vomiting, diarrhea, constipation, blood in stool and abdominal distention.  Genitourinary: Negative for hematuria and difficulty urinating.  Musculoskeletal: Negative for arthralgias, myalgias and neck pain.  Skin: Negative for rash.  Neurological: Positive for dizziness. Negative for seizures, syncope and headaches.  Hematological: Negative for adenopathy. Does not bruise/bleed easily.  Psychiatric/Behavioral: Negative for dysphoric mood. The patient is not  nervous/anxious.        Objective:   Physical Exam  Nursing note and vitals reviewed. Constitutional: He is oriented to person, place, and time. He appears well-developed and well-nourished. No distress.  HENT:  Head: Normocephalic and atraumatic.  Right Ear: Hearing, tympanic membrane, external ear and ear canal normal.  Left Ear: Hearing, tympanic membrane, external ear and ear canal normal.  Nose: Nose normal.  Mouth/Throat: Oropharynx is clear and moist. No oropharyngeal exudate.  PN drainage  Eyes: Conjunctivae and EOM are normal. Pupils are equal, round, and reactive to light. No scleral icterus.  Neck: Normal range of motion. Neck supple. No thyromegaly present.  Cardiovascular: Normal rate, regular rhythm, normal heart sounds and intact distal pulses.   No murmur heard. Pulses:      Radial pulses are 2+ on the right side, and 2+ on the left side.  Pulmonary/Chest: Effort normal and breath sounds normal. No respiratory distress. He has no wheezes. He has no rales.  Abdominal: Soft. Bowel sounds are normal. He exhibits no distension and no mass. There is no tenderness. There is no rebound and no guarding.  Genitourinary: Rectum normal. Rectal exam shows no external hemorrhoid, no internal hemorrhoid, no fissure, no mass, no tenderness and anal tone normal. Prostate is enlarged (30-40gm). Prostate is not tender.  Musculoskeletal: Normal range of motion. He exhibits no edema.  Lymphadenopathy:    He has no cervical adenopathy.  Neurological: He is alert and oriented to person, place, and time.  CN grossly intact, station and gait intact  Skin: Skin is warm and dry. No rash noted.  Psychiatric: He has a normal mood and affect. His behavior is normal. Judgment and thought content normal.       Assessment & Plan:

## 2013-09-22 NOTE — Assessment & Plan Note (Signed)
Mild off meds. 

## 2013-09-22 NOTE — Assessment & Plan Note (Signed)
Stable off meds. ?

## 2013-09-22 NOTE — Progress Notes (Signed)
Pre-visit discussion using our clinic review tool. No additional management support is needed unless otherwise documented below in the visit note.  

## 2013-09-22 NOTE — Assessment & Plan Note (Signed)
Preventative protocols reviewed and updated unless pt declined. Discussed healthy diet and lifestyle.  

## 2013-09-22 NOTE — Patient Instructions (Signed)
Good to see you today, call us with questions. Return as needed or in 1 year for next physical. Work on low cholesterol diet.

## 2014-02-07 ENCOUNTER — Other Ambulatory Visit: Payer: Self-pay

## 2014-02-07 MED ORDER — ESOMEPRAZOLE MAGNESIUM 40 MG PO CPDR: DELAYED_RELEASE_CAPSULE | ORAL | Status: DC

## 2014-02-07 NOTE — Telephone Encounter (Signed)
Printed and placed in [facility] box. See below.

## 2014-02-07 NOTE — Telephone Encounter (Signed)
Pt wants printed rx of Nexium faxed to [facility] fax # [phone]  With order # [id number] included with prescription. If cannot fax Nexium rx to [facility] contact pt for pick up info.

## 2014-02-08 NOTE — Telephone Encounter (Signed)
Rx faxed to pharmacy and message left notifying patient.

## 2014-02-23 ENCOUNTER — Ambulatory Visit (INDEPENDENT_AMBULATORY_CARE_PROVIDER_SITE_OTHER): Payer: Medicare HMO | Admitting: Adult Health

## 2014-02-23 ENCOUNTER — Encounter: Payer: Self-pay | Admitting: Adult Health

## 2014-02-23 ENCOUNTER — Telehealth: Payer: Self-pay | Admitting: Family Medicine

## 2014-02-23 VITALS — BP 124/74 | HR 81 | Temp 99.1°F | Resp 14 | Ht 74.0 in | Wt 180.2 lb

## 2014-02-23 DIAGNOSIS — H109 Unspecified conjunctivitis: Secondary | ICD-10-CM | POA: Insufficient documentation

## 2014-02-23 DIAGNOSIS — J029 Acute pharyngitis, unspecified: Secondary | ICD-10-CM

## 2014-02-23 LAB — POCT RAPID STREP A (OFFICE): Rapid Strep A Screen: NEGATIVE

## 2014-02-23 MED ORDER — POLYMYXIN B-TRIMETHOPRIM 10000-0.1 UNIT/ML-% OP SOLN: 1.0000 [drp] | OPHTHALMIC | Status: DC

## 2014-02-23 MED ORDER — AZITHROMYCIN 250 MG PO TABS: ORAL_TABLET | ORAL | Status: DC

## 2014-02-23 NOTE — Telephone Encounter (Signed)
Patient Information:  Caller Name: Declin  Phone: [phone]  Patient: Benjamin Robles, Benjamin Robles  Gender: Male  DOB: 10-18-1955  Age: 58 Years  PCP: Ria Bush [facility])  Office Follow Up:  Does the office need to follow up with this patient?: No  Instructions For The Office: N/A  RN Note:  Denies nasal discharge. Occasional yellow phlem from productive cough. Mild sore throat during night eases off during the day.  Instructed to use wet cotton balls or paper towel to remove mucus, then wash hands.  Call if fever, eye pain, continued discharge after 3 days on treatment, or symptoms worsen.  No appointments remain within 4 hours for [facility].  Scheduled for 0930 02/23/14 with Doreen Salvage, NP at [facility] office.   Symptoms  Reason For Call & Symptoms: Redness of Left eye sclera with thick yellow discharge; upper lid is red and swollen 1/2 shut.  Cold symptoms present X 1 week.  Reviewed Health History In EMR: Yes  Reviewed Medications In EMR: Yes  Reviewed Allergies In EMR: Yes  Reviewed Surgeries / Procedures: Yes  Date of Onset of Symptoms: 02/22/2014  Treatments Tried: otc cold medicine  Treatments Tried Worked: Yes  Guideline(s) Used:  Eye - Pus or Discharge  Disposition Per Guideline:   Go to Office Now  Reason For Disposition Reached:   Eyelids are very swollen (shut or almost)  Advice Given:  N/A  Patient Will Follow Care Advice:  YES  Appointment Scheduled:  02/23/2014 09:30:00 Appointment Scheduled Provider:  Other

## 2014-02-23 NOTE — Progress Notes (Signed)
Pre visit review using our clinic review tool, if applicable. No additional management support is needed unless otherwise documented below in the visit note. 

## 2014-02-23 NOTE — Progress Notes (Signed)
Patient ID: PASCUAL MANTEL, male   DOB: 01/12/1956, 58 y.o.   MRN: [id number]    Subjective:    Patient ID: MITSUO BUDNICK, male    DOB: 1956-08-07, 58 y.o.   MRN: [id number]  HPI  Mr. Greis is a pleasant 58 y/o male who presents with sore throat that has been ongoing almost 10 days. Experiencing mild discomfort in his right ear. He reports that his wife and daughter both had this. He initially tried Zicam which usually helps him; however, symptoms progressed. He has a low grade temp in clinic today. Yesterday, he began to develop irritation, redness and discomfort of the left eye, crusting and drainage.   Past Medical History  Diagnosis Date  . Bradycardia   . Anticoagulant disorder   . History of colon polyps     normal colonoscopy 2012  . Umbilical hernia   . GERD (gastroesophageal reflux disease)   . Allergy     SINUS ALLEGIES  . Anxiety   . Depression   . Pain of right sternoclavicular joint     MRI 06/2012 showing ?erosive arthritis  . BPH (benign prostatic hypertrophy)     mild    Current Outpatient Prescriptions on File Prior to Visit  Medication Sig Dispense Refill  . calcium carbonate (TUMS - DOSED IN MG ELEMENTAL CALCIUM) 500 MG chewable tablet Chew 1 tablet by mouth as needed.        . Cholecalciferol (VITAMIN D-3) 5000 UNITS TABS Take 1 tablet by mouth daily.        Marland Kitchen esomeprazole (NEXIUM) 40 MG capsule TAKE 1 CAPSULE DAILY FOR REFLUX  90 capsule  3  . fluticasone (FLONASE) 50 MCG/ACT nasal spray USE 2 SPRAYS NASALY DAILY  48 g  3  . magnesium 30 MG tablet Take 30 mg by mouth daily.      . multivitamin (THERAGRAN) per tablet Take 1 tablet by mouth daily.        . psyllium (METAMUCIL) 58.6 % powder Take 1 packet by mouth daily as needed.        No current facility-administered medications on file prior to visit.    Review of Systems  Constitutional: Positive for fever.  HENT: Positive for sore throat.   Eyes: Positive for discharge and redness.       Left eye red,  drainage, crusting  Respiratory: Positive for cough.   All other systems reviewed and are negative.      Objective:  BP 124/74  Pulse 81  Temp(Src) 99.1 F (37.3 C) (Oral)  Resp 14  Ht 6\' 2"  (1.88 m)  Wt 180 lb 4 oz (81.761 kg)  BMI 23.13 kg/m2  SpO2 96%   Physical Exam  Constitutional: He is oriented to person, place, and time. He appears well-developed and well-nourished. No distress.  Appears uncomfortable.  HENT:  Head: Normocephalic and atraumatic.  Mouth/Throat: No oropharyngeal exudate.  Pharynx is significantly erythematous. Slight drainage noted posterior pharynx.  Eyes: EOM are normal. Left eye exhibits discharge.  Left eye injected with drainage  Neck: Normal range of motion. Neck supple.  Cardiovascular: Normal rate, regular rhythm and normal heart sounds.  Exam reveals no gallop and no friction rub.   No murmur heard. Pulmonary/Chest: Effort normal and breath sounds normal. No respiratory distress. He has no wheezes. He has no rales.  Musculoskeletal: Normal range of motion.  Lymphadenopathy:    He has cervical adenopathy.  Neurological: He is alert and oriented to person, place, and time.  Skin: Skin is warm and dry.  Psychiatric: He has a normal mood and affect. His behavior is normal. Judgment and thought content normal.      Assessment & Plan:   1. Sore throat Negative for strep. Throat is extremely erythematous. Ongoing, persistent symptoms going on 10 days. Treat with Azithromycin. Supportive care with chloraseptic spray or lozenges, tylenol or ibuprofen for fever or general discomfort. He is to follow up with his PCP if no symptoms resolution. - POCT rapid strep A  2. Conjunctivitis of left eye Left eye significantly injected, crusting and drainage. Treat with polytrim. Instructed that he may also use on the right eye should he begin to develop symptoms there as well. Cool compresses for comfort. Good hand washing.

## 2014-02-23 NOTE — Patient Instructions (Signed)
  Start azithromycin 2 tablets today then one tablet daily for the next 4 days.  Use Chloraseptic spray or lozenges for sore throat. Saltwater gargles may also help relieve your sore throat. Cool drinks, ice chips are also soothing.  Stay hydrated.  You may use Tylenol or ibuprofen for fever or general body discomfort.  Apply Polytrim eyedrops 1 drop into the affected eye every 4 hours for 7 days.  Please follow up with your PCP if your symptoms are not improved within 4-5 days or sooner if necessary.

## 2014-11-11 ENCOUNTER — Other Ambulatory Visit: Payer: Self-pay | Admitting: Family Medicine

## 2014-11-11 DIAGNOSIS — N400 Enlarged prostate without lower urinary tract symptoms: Secondary | ICD-10-CM

## 2014-11-11 DIAGNOSIS — E7800 Pure hypercholesterolemia, unspecified: Secondary | ICD-10-CM

## 2014-11-13 ENCOUNTER — Other Ambulatory Visit (INDEPENDENT_AMBULATORY_CARE_PROVIDER_SITE_OTHER): Payer: Managed Care, Other (non HMO)

## 2014-11-13 DIAGNOSIS — Z0000 Encounter for general adult medical examination without abnormal findings: Secondary | ICD-10-CM | POA: Diagnosis not present

## 2014-11-13 DIAGNOSIS — E7800 Pure hypercholesterolemia, unspecified: Secondary | ICD-10-CM

## 2014-11-13 DIAGNOSIS — N400 Enlarged prostate without lower urinary tract symptoms: Secondary | ICD-10-CM

## 2014-11-13 LAB — BASIC METABOLIC PANEL
BUN: 13 mg/dL (ref 6–23)
CALCIUM: 9.7 mg/dL (ref 8.4–10.5)
CHLORIDE: 103 meq/L (ref 96–112)
CO2: 32 mEq/L (ref 19–32)
CREATININE: 1.2 mg/dL (ref 0.40–1.50)
GFR: 66 mL/min (ref >60.00)
Glucose, Bld: 97 mg/dL (ref 70–99)
Potassium: 4.6 mEq/L (ref 3.5–5.1)
Sodium: 140 mEq/L (ref 135–145)

## 2014-11-13 LAB — LIPID PANEL
CHOLESTEROL: 181 mg/dL (ref 0–200)
HDL: 47.4 mg/dL (ref >39.00)
LDL Cholesterol: 115 mg/dL — ABNORMAL HIGH (ref 0–99)
NONHDL: 133.6
Total CHOL/HDL Ratio: 4
Triglycerides: 91 mg/dL (ref 0.0–149.0)
VLDL: 18.2 mg/dL (ref 0.0–40.0)

## 2014-11-13 LAB — PSA: PSA: 0.83 ng/mL (ref 0.10–4.00)

## 2014-11-16 ENCOUNTER — Encounter: Payer: Medicare HMO | Admitting: Family Medicine

## 2014-11-21 ENCOUNTER — Encounter: Payer: Self-pay | Admitting: Family Medicine

## 2014-11-21 ENCOUNTER — Ambulatory Visit (INDEPENDENT_AMBULATORY_CARE_PROVIDER_SITE_OTHER): Payer: Managed Care, Other (non HMO) | Admitting: Family Medicine

## 2014-11-21 VITALS — BP 128/76 | HR 72 | Temp 98.0°F | Ht 72.75 in | Wt 174.1 lb

## 2014-11-21 DIAGNOSIS — Z0000 Encounter for general adult medical examination without abnormal findings: Secondary | ICD-10-CM

## 2014-11-21 DIAGNOSIS — K2100 Gastro-esophageal reflux disease with esophagitis, without bleeding: Secondary | ICD-10-CM

## 2014-11-21 DIAGNOSIS — J309 Allergic rhinitis, unspecified: Secondary | ICD-10-CM

## 2014-11-21 DIAGNOSIS — F341 Dysthymic disorder: Secondary | ICD-10-CM

## 2014-11-21 DIAGNOSIS — E7800 Pure hypercholesterolemia, unspecified: Secondary | ICD-10-CM

## 2014-11-21 DIAGNOSIS — N400 Enlarged prostate without lower urinary tract symptoms: Secondary | ICD-10-CM

## 2014-11-21 NOTE — Progress Notes (Signed)
BP 128/76 mmHg  Pulse 72  Temp(Src) 98 F (36.7 C) (Oral)  Ht 6' 0.75" (1.848 m)  Wt 174 lb 1.9 oz (78.98 kg)  BMI 23.13 kg/m2  SpO2 99%   CC: CPE  Subjective:    Patient ID: Benjamin Robles, male    DOB: Jan 09, 1956, 59 y.o.   MRN: [id number]  HPI: Benjamin Robles is a 59 y.o. male presenting on 11/21/2014 for Annual Exam   GERD - nexium 40mg  daily, tums at night. Heartburn stable, recently better. Actually taking nexium 20mg  OTC 2 daily. EGD 2012 - superficial gastritis.  Slow weight loss noted - pt working out but not trying to lose weight. Pt feels well. States weight stable around 175lbs.  Preventative: Colonoscopy 07/2011 - WNL rec rpt 10 yrs [name])  Prostate cancer screening - father with h/o slow growing prostate cancer.H/o BPH with some frequency and nocturia x0-1.  Flu shot - 05/2014 Tetanus shot - states done at hospital around 2010  Seat belt use discussed Sunsucreen use discussed - no changing moles on skin, sees derm yearly  Lives with wife, 2 cats and 2 dogs, grown children  Occupation: phone technical support  Activity: no regular exercise routine  Diet: good water, fruits/vegetables daily  Relevant past medical, surgical, family and social history reviewed and updated as indicated. Interim medical history since our last visit reviewed. Allergies and medications reviewed and updated. Current Outpatient Prescriptions on File Prior to Visit  Medication Sig  . calcium carbonate (TUMS - DOSED IN MG ELEMENTAL CALCIUM) 500 MG chewable tablet Chew 1 tablet by mouth as needed.    Marland Kitchen esomeprazole (NEXIUM) 40 MG capsule TAKE 1 CAPSULE DAILY FOR REFLUX  . fluticasone (FLONASE) 50 MCG/ACT nasal spray USE 2 SPRAYS NASALY DAILY  . magnesium 30 MG tablet Take 30 mg by mouth daily.  . multivitamin (THERAGRAN) per tablet Take 1 tablet by mouth daily.    . psyllium (METAMUCIL) 58.6 % powder Take 1 packet by mouth daily as needed.    No current facility-administered  medications on file prior to visit.   Past Medical History  Diagnosis Date  . Bradycardia   . Anticoagulant disorder   . History of colon polyps     normal colonoscopy 2012  . Umbilical hernia   . GERD (gastroesophageal reflux disease)   . Allergy     SINUS ALLEGIES  . Anxiety   . Depression   . Pain of right sternoclavicular joint     MRI 06/2012 showing ?erosive arthritis  . BPH (benign prostatic hypertrophy)     mild    Past Surgical History  Procedure Laterality Date  . Hernia repair      x2  . Colonoscopy  08/07/2011    WNL, rpt 10 yrs  . Upper gastrointestinal endoscopy  08/07/2011    chronic superficial gastritis, oozing nodule clipped, neg H pylori   Review of Systems  Constitutional: Positive for unexpected weight change (weight loss). Negative for fever, chills, activity change, appetite change and fatigue.  HENT: Negative for hearing loss.   Eyes: Negative for visual disturbance.  Respiratory: Negative for cough, chest tightness, shortness of breath and wheezing.   Cardiovascular: Negative for chest pain, palpitations and leg swelling.  Gastrointestinal: Negative for nausea, vomiting, abdominal pain, diarrhea, constipation, blood in stool and abdominal distention.  Genitourinary: Negative for hematuria and difficulty urinating.  Musculoskeletal: Negative for myalgias, arthralgias and neck pain.  Skin: Negative for rash.  Neurological: Negative for dizziness, seizures, syncope  and headaches.  Hematological: Negative for adenopathy. Does not bruise/bleed easily.  Psychiatric/Behavioral: Negative for dysphoric mood. The patient is not nervous/anxious.    Per HPI unless specifically indicated above     Objective:    BP 128/76 mmHg  Pulse 72  Temp(Src) 98 F (36.7 C) (Oral)  Ht 6' 0.75" (1.848 m)  Wt 174 lb 1.9 oz (78.98 kg)  BMI 23.13 kg/m2  SpO2 99%  Wt Readings from Last 3 Encounters:  11/21/14 174 lb 1.9 oz (78.98 kg)  02/23/14 180 lb 4 oz (81.761  kg)  09/22/13 188 lb 4 oz (85.39 kg)    Physical Exam  Constitutional: He is oriented to person, place, and time. He appears well-developed and well-nourished. No distress.  HENT:  Head: Normocephalic and atraumatic.  Right Ear: Hearing, tympanic membrane, external ear and ear canal normal.  Left Ear: Hearing, tympanic membrane, external ear and ear canal normal.  Nose: Nose normal.  Mouth/Throat: Uvula is midline, oropharynx is clear and moist and mucous membranes are normal. No oropharyngeal exudate, posterior oropharyngeal edema or posterior oropharyngeal erythema.  Eyes: Conjunctivae and EOM are normal. Pupils are equal, round, and reactive to light. No scleral icterus.  Neck: Normal range of motion. Neck supple. Carotid bruit is not present. No thyromegaly present.  Cardiovascular: Normal rate, regular rhythm, normal heart sounds and intact distal pulses.   No murmur heard. Pulses:      Radial pulses are 2+ on the right side, and 2+ on the left side.  Pulmonary/Chest: Effort normal and breath sounds normal. No respiratory distress. He has no wheezes. He has no rales.  Abdominal: Soft. Bowel sounds are normal. He exhibits no distension and no mass. There is no tenderness. There is no rebound and no guarding.  Genitourinary: Rectum normal. Rectal exam shows no external hemorrhoid, no internal hemorrhoid, no fissure, no mass, no tenderness and anal tone normal. Prostate is enlarged (30gm). Prostate is not tender.  Musculoskeletal: Normal range of motion. He exhibits no edema.  Lymphadenopathy:    He has no cervical adenopathy.  Neurological: He is alert and oriented to person, place, and time.  CN grossly intact, station and gait intact  Skin: Skin is warm and dry. No rash noted.  Psychiatric: He has a normal mood and affect. His behavior is normal. Judgment and thought content normal.  Nursing note and vitals reviewed.  Results for orders placed or performed in visit on 11/13/14    Lipid panel  Result Value Ref Range   Cholesterol 181 0 - 200 mg/dL   Triglycerides 91.0 0.0 - 149.0 mg/dL   HDL 47.40 >39.00 mg/dL   VLDL 18.2 0.0 - 40.0 mg/dL   LDL Cholesterol 115 (H) 0 - 99 mg/dL   Total CHOL/HDL Ratio 4    NonHDL 133.60   PSA  Result Value Ref Range   PSA 0.83 0.10 - 4.00 ng/mL  Basic metabolic panel  Result Value Ref Range   Sodium 140 135 - 145 mEq/L   Potassium 4.6 3.5 - 5.1 mEq/L   Chloride 103 96 - 112 mEq/L   CO2 32 19 - 32 mEq/L   Glucose, Bld 97 70 - 99 mg/dL   BUN 13 6 - 23 mg/dL   Creatinine, Ser 1.20 0.40 - 1.50 mg/dL   Calcium 9.7 8.4 - 10.5 mg/dL   GFR 66.00 >60.00 mL/min      Assessment & Plan:   Problem List Items Addressed This Visit    Pure hypercholesterolemia  Reviewed #s - congratulated on improvement noted.       Healthcare maintenance - Primary    Preventative protocols reviewed and updated unless pt declined. Discussed healthy diet and lifestyle.       GERD (gastroesophageal reflux disease)    Continue nexium 20mg  2 tablets daily + tums prn. S/p EGD with gastritis.      BPH (benign prostatic hypertrophy)    Stable off meds.      ANXIETY DEPRESSION    Stable off meds.      Allergic rhinitis    Stable on flonase.          Follow up plan: Return in about 1 year (around 11/21/2015), or as needed, for annual exam, prior fasting for blood work.

## 2014-11-21 NOTE — Assessment & Plan Note (Signed)
Reviewed #s - congratulated on improvement noted.

## 2014-11-21 NOTE — Assessment & Plan Note (Addendum)
Continue nexium 20mg  2 tablets daily + tums prn. S/p EGD with gastritis.

## 2014-11-21 NOTE — Progress Notes (Signed)
Pre visit review using our clinic review tool, if applicable. No additional management support is needed unless otherwise documented below in the visit note. 

## 2014-11-21 NOTE — Assessment & Plan Note (Signed)
Stable on flonase.

## 2014-11-21 NOTE — Patient Instructions (Signed)
I think you're doing well. Return as needed or in 1 year for next physical.

## 2014-11-21 NOTE — Assessment & Plan Note (Signed)
Stable off meds. ?

## 2014-11-21 NOTE — Assessment & Plan Note (Signed)
Preventative protocols reviewed and updated unless pt declined. Discussed healthy diet and lifestyle.  

## 2014-12-14 ENCOUNTER — Other Ambulatory Visit: Payer: Self-pay | Admitting: *Deleted

## 2014-12-14 MED ORDER — FLUTICASONE PROPIONATE 50 MCG/ACT NA SUSP: NASAL | Status: AC

## 2015-03-08 ENCOUNTER — Encounter: Payer: Self-pay | Admitting: Internal Medicine

## 2015-04-06 ENCOUNTER — Ambulatory Visit (INDEPENDENT_AMBULATORY_CARE_PROVIDER_SITE_OTHER): Payer: Managed Care, Other (non HMO) | Admitting: Internal Medicine

## 2015-04-06 ENCOUNTER — Encounter: Payer: Self-pay | Admitting: Internal Medicine

## 2015-04-06 VITALS — BP 128/82 | HR 76 | Temp 98.6°F | Wt 179.0 lb

## 2015-04-06 DIAGNOSIS — J309 Allergic rhinitis, unspecified: Secondary | ICD-10-CM | POA: Diagnosis not present

## 2015-04-06 NOTE — Patient Instructions (Signed)

## 2015-04-06 NOTE — Progress Notes (Signed)
HPI  Pt presents to the clinic today with c/o headache, facial pain and pressure, nasal congestion, post nasal drip and chest congestion. This started 1 week ago. He is not blowing anything out of his nose. He denies fevers, chills or body aches. He has tried Flonase without much relief. He does have a history of seasonal allergies. He has not had sick contacts.  Review of Systems    Past Medical History  Diagnosis Date  . Bradycardia   . Anticoagulant disorder   . History of colon polyps     normal colonoscopy 2012  . Umbilical hernia   . GERD (gastroesophageal reflux disease)   . Allergy     SINUS ALLEGIES  . Anxiety   . Depression   . Pain of right sternoclavicular joint     MRI 06/2012 showing ?erosive arthritis  . BPH (benign prostatic hypertrophy)     mild    Family History  Problem Relation Age of Onset  . Depression Father   . Prostate cancer Father 60    slow growing  . Hypertension Mother   . CAD Neg Hx   . Stroke Neg Hx   . Diabetes Neg Hx     Social History   Social History  . Marital Status: Married    Spouse Name: N/A  . Number of Children: 2  . Years of Education: N/A   Occupational History  . Tech support    Social History Main Topics  . Smoking status: Former Research scientist (life sciences)  . Smokeless tobacco: Never Used  . Alcohol Use: Yes     Comment: 1 beer/day  . Drug Use: No  . Sexual Activity: Not on file   Other Topics Concern  . Not on file   Social History Narrative   Lives with wife, 2 cats and 2 dogs, grown children   Occupation: Air cabin crew support   Activity: no regular exercise routine   Diet: good water, fruits/vegetables daily    Allergies  Allergen Reactions  . Amoxicillin-Pot Clavulanate     REACTION: achey, itch     Constitutional: Positive headache, fatigue . Denies fever or abrupt weight changes.  HEENT:  Positive facial pain, nasal congestion and sore throat. Denies eye redness, ear pain, ringing in the ears, wax buildup,  runny nose or bloody nose. Respiratory: Positive cough. Denies difficulty breathing or shortness of breath.  Cardiovascular: Denies chest pain, chest tightness, palpitations or swelling in the hands or feet.   No other specific complaints in a complete review of systems (except as listed in HPI above).  Objective:  BP 128/82 mmHg  Pulse 76  Temp(Src) 98.6 F (37 C) (Oral)  Wt 179 lb (81.194 kg)  SpO2 98%   General: Appears his stated age, well developed, well nourished in NAD. HEENT: Head: normal shape and size, left maxillary sinus tenderness noted; Eyes: sclera white, no icterus, conjunctiva pink, PERRLA and EOMs intact; Ears: Tm's Flannagan and intact, normal light reflex; Nose: mucosa boggy and moist, septum midline; Throat/Mouth: + PND. Teeth present, mucosa erythematous and moist, no exudate noted, no lesions or ulcerations noted.  Neck: No cervical adenopathy. Cardiovascular: Normal rate and rhythm. S1,S2 noted.  No murmur, rubs or gallops noted.  Pulmonary/Chest: Normal effort and positive vesicular breath sounds. No respiratory distress. No wheezes, rales or ronchi noted.      Assessment & Plan:   Allergic Rhinitis:  Can use a Neti Pot which can be purchased from your local drug store. Flonase 2 sprays each  nostril for 3 days and then as needed. Start Zyrtec at night for the next 3 days If not feeling better by Monday, will call in Ceftin BID x 10 days  RTC as needed or if symptoms persist.

## 2015-04-06 NOTE — Progress Notes (Signed)
Pre visit review using our clinic review tool, if applicable. No additional management support is needed unless otherwise documented below in the visit note. 

## 2015-04-09 ENCOUNTER — Telehealth: Payer: Self-pay | Admitting: Family Medicine

## 2015-04-09 ENCOUNTER — Other Ambulatory Visit: Payer: Self-pay | Admitting: Internal Medicine

## 2015-04-09 MED ORDER — CEFUROXIME AXETIL 500 MG PO TABS: 500.0000 mg | ORAL_TABLET | Freq: Two times a day (BID) | ORAL | Status: DC

## 2015-04-09 NOTE — Telephone Encounter (Signed)
Pt called back to let you know he is not doing any better and would like something called in  [place]

## 2015-04-09 NOTE — Telephone Encounter (Signed)
Ceftin sent to [facility]

## 2015-06-05 ENCOUNTER — Ambulatory Visit (INDEPENDENT_AMBULATORY_CARE_PROVIDER_SITE_OTHER): Payer: Managed Care, Other (non HMO) | Admitting: Family Medicine

## 2015-06-05 ENCOUNTER — Encounter: Payer: Self-pay | Admitting: Family Medicine

## 2015-06-05 VITALS — BP 124/84 | HR 64 | Temp 98.2°F | Wt 182.8 lb

## 2015-06-05 DIAGNOSIS — K2100 Gastro-esophageal reflux disease with esophagitis, without bleeding: Secondary | ICD-10-CM

## 2015-06-05 NOTE — Progress Notes (Signed)
Pre visit review using our clinic review tool, if applicable. No additional management support is needed unless otherwise documented below in the visit note. 

## 2015-06-05 NOTE — Progress Notes (Signed)
BP 124/84 mmHg  Pulse 64  Temp(Src) 98.2 F (36.8 C) (Oral)  Wt 182 lb 12 oz (82.895 kg)   CC: chronic sore throat/drainage  Subjective:    Patient ID: Benjamin Robles, male    DOB: 10/12/1955, 59 y.o.   MRN: [id number]  HPI: Benjamin Robles is a 59 y.o. male presenting on 06/05/2015 for Sore Throat   GERD aggravated despite nexium 40mg  daily (takes OTC 20mg  x2 daily) and rare tums. Feels ?fluid in mid chest that is slow to resolve - feels this way after bronchitis.  Chronic allergic rhinitis usually well treated with flonase which he is taking daily. Scratchy throat with drainage.  Occasional dizziness.   No fevers/chills, ear or tooth pain, headache, nausea/vomiting.  No dysphagia. No early satiety.   H/o allergic rhinitis + sinusitis treated with 10d ceftin 03/2015. EGD 2012 - chronic superficial gastritis with oozing nodule.   Relevant past medical, surgical, family and social history reviewed and updated as indicated. Interim medical history since our last visit reviewed. Allergies and medications reviewed and updated. Current Outpatient Prescriptions on File Prior to Visit  Medication Sig  . calcium carbonate (TUMS - DOSED IN MG ELEMENTAL CALCIUM) 500 MG chewable tablet Chew 1 tablet by mouth as needed.    Marland Kitchen esomeprazole (NEXIUM) 40 MG capsule TAKE 1 CAPSULE DAILY FOR REFLUX  . fluticasone (FLONASE) 50 MCG/ACT nasal spray USE 2 SPRAYS NASALY DAILY  . ketoconazole (NIZORAL) 2 % cream   . magnesium 30 MG tablet Take 30 mg by mouth daily.  . multivitamin (THERAGRAN) per tablet Take 1 tablet by mouth daily.    . psyllium (METAMUCIL) 58.6 % powder Take 1 packet by mouth daily.    No current facility-administered medications on file prior to visit.    Review of Systems Per HPI unless specifically indicated above     Objective:    BP 124/84 mmHg  Pulse 64  Temp(Src) 98.2 F (36.8 C) (Oral)  Wt 182 lb 12 oz (82.895 kg)  Wt Readings from Last 3 Encounters:  06/05/15 182 lb  12 oz (82.895 kg)  04/06/15 179 lb (81.194 kg)  11/21/14 174 lb 1.9 oz (78.98 kg)   Body mass index is 24.27 kg/(m^2).  Physical Exam  Constitutional: He appears well-developed and well-nourished. No distress.  HENT:  Head: Normocephalic and atraumatic.  Mouth/Throat: Oropharynx is clear and moist. No oropharyngeal exudate.  Eyes: Conjunctivae and EOM are normal. Pupils are equal, round, and reactive to light. No scleral icterus.  Neck: Normal range of motion. Neck supple. No thyromegaly present.  Cardiovascular: Normal rate, regular rhythm, normal heart sounds and intact distal pulses.   No murmur heard. Pulmonary/Chest: Effort normal and breath sounds normal. No respiratory distress. He has no wheezes. He has no rales.  Abdominal: Soft. Normal appearance and bowel sounds are normal. He exhibits no distension and no mass. There is no hepatosplenomegaly. There is no tenderness. There is no rigidity, no rebound, no guarding and negative Murphy's sign.  Musculoskeletal: He exhibits no edema.  Skin: Skin is warm and dry. No rash noted.  Psychiatric: He has a normal mood and affect.  Nursing note and vitals reviewed.     Assessment & Plan:   Problem List Items Addressed This Visit    GERD (gastroesophageal reflux disease) - Primary    No red flags.  Sxs consistent with worsening GERD despite nexium 40mg  daily. rec add pepcid 20mg  or zantac 150mg  OTC nightly. Provided with GERD diet handout.  Update with effect. Rare tums use. EGD with h/o gastritis.          Follow up plan: Return if symptoms worsen or fail to improve.

## 2015-06-05 NOTE — Patient Instructions (Signed)
Continue flonase and nexium 40mg  daily. Add on pepcid 20mg  or zantac 150mg  nightly for next several weeks.  Avoidance of citrus, fatty foods, chocolate, peppermint, and excessive alcohol, along with sodas, orange juice (acidic drinks) At least a few hours between dinner and bed, minimize naps after eating.  Food Choices for Gastroesophageal Reflux Disease, Adult When you have gastroesophageal reflux disease (GERD), the foods you eat and your eating habits are very important. Choosing the right foods can help ease the discomfort of GERD. WHAT GENERAL GUIDELINES DO I NEED TO FOLLOW?  Choose fruits, vegetables, whole grains, low-fat dairy products, and low-fat meat, fish, and poultry.  Limit fats such as oils, salad dressings, butter, nuts, and avocado.  Keep a food diary to identify foods that cause symptoms.  Avoid foods that cause reflux. These may be different for different people.  Eat frequent small meals instead of three large meals each day.  Eat your meals slowly, in a relaxed setting.  Limit fried foods.  Cook foods using methods other than frying.  Avoid drinking alcohol.  Avoid drinking large amounts of liquids with your meals.  Avoid bending over or lying down until 2-3 hours after eating. WHAT FOODS ARE NOT RECOMMENDED? The following are some foods and drinks that may worsen your symptoms: Vegetables Tomatoes. Tomato juice. Tomato and spaghetti sauce. Chili peppers. Onion and garlic. Horseradish. Fruits Oranges, grapefruit, and lemon (fruit and juice). Meats High-fat meats, fish, and poultry. This includes hot dogs, ribs, ham, sausage, salami, and bacon. Dairy Whole milk and chocolate milk. Sour cream. Cream. Butter. Ice cream. Cream cheese.  Beverages Coffee and tea, with or without caffeine. Carbonated beverages or energy drinks. Condiments Hot sauce. Barbecue sauce.  Sweets/Desserts Chocolate and cocoa. Donuts. Peppermint and spearmint. Fats and  Oils High-fat foods, including [place] fries and potato chips. Other Vinegar. Strong spices, such as black pepper, white pepper, red pepper, cayenne, curry powder, cloves, ginger, and chili powder. The items listed above may not be a complete list of foods and beverages to avoid. Contact your dietitian for more information.   This information is not intended to replace advice given to you by your health care provider. Make sure you discuss any questions you have with your health care provider.   Document Released: 08/11/2005 Document Revised: 09/01/2014 Document Reviewed: 06/15/2013 Elsevier Interactive Patient Education [employer].

## 2015-06-05 NOTE — Assessment & Plan Note (Signed)
No red flags.  Sxs consistent with worsening GERD despite nexium 40mg  daily. rec add pepcid 20mg  or zantac 150mg  OTC nightly. Provided with GERD diet handout. Update with effect. Rare tums use. EGD with h/o gastritis.

## 2015-11-25 ENCOUNTER — Other Ambulatory Visit: Payer: Self-pay | Admitting: Family Medicine

## 2015-11-25 DIAGNOSIS — E7800 Pure hypercholesterolemia, unspecified: Secondary | ICD-10-CM

## 2015-11-25 DIAGNOSIS — N400 Enlarged prostate without lower urinary tract symptoms: Secondary | ICD-10-CM

## 2015-11-26 ENCOUNTER — Other Ambulatory Visit (INDEPENDENT_AMBULATORY_CARE_PROVIDER_SITE_OTHER): Payer: Managed Care, Other (non HMO)

## 2015-11-26 DIAGNOSIS — Z0000 Encounter for general adult medical examination without abnormal findings: Secondary | ICD-10-CM

## 2015-11-26 DIAGNOSIS — E7800 Pure hypercholesterolemia, unspecified: Secondary | ICD-10-CM | POA: Diagnosis not present

## 2015-11-26 DIAGNOSIS — N400 Enlarged prostate without lower urinary tract symptoms: Secondary | ICD-10-CM

## 2015-11-26 LAB — LIPID PANEL
CHOL/HDL RATIO: 4
CHOLESTEROL: 184 mg/dL (ref 0–200)
HDL: 41.6 mg/dL (ref >39.00)
LDL Cholesterol: 120 mg/dL — ABNORMAL HIGH (ref 0–99)
NONHDL: 142.69
Triglycerides: 111 mg/dL (ref 0.0–149.0)
VLDL: 22.2 mg/dL (ref 0.0–40.0)

## 2015-11-26 LAB — PSA: PSA: 0.75 ng/mL (ref 0.10–4.00)

## 2015-11-26 LAB — BASIC METABOLIC PANEL
BUN: 17 mg/dL (ref 6–23)
CALCIUM: 9.3 mg/dL (ref 8.4–10.5)
CO2: 29 mEq/L (ref 19–32)
Chloride: 106 mEq/L (ref 96–112)
Creatinine, Ser: 1.22 mg/dL (ref 0.40–1.50)
GFR: 64.53 mL/min (ref >60.00)
Glucose, Bld: 99 mg/dL (ref 70–99)
Potassium: 3.9 mEq/L (ref 3.5–5.1)
SODIUM: 142 meq/L (ref 135–145)

## 2015-11-29 ENCOUNTER — Encounter: Payer: Managed Care, Other (non HMO) | Admitting: Family Medicine

## 2015-11-30 ENCOUNTER — Encounter: Payer: Managed Care, Other (non HMO) | Admitting: Family Medicine

## 2015-12-10 ENCOUNTER — Ambulatory Visit (INDEPENDENT_AMBULATORY_CARE_PROVIDER_SITE_OTHER): Payer: Managed Care, Other (non HMO) | Admitting: Family Medicine

## 2015-12-10 ENCOUNTER — Encounter: Payer: Self-pay | Admitting: Family Medicine

## 2015-12-10 VITALS — BP 110/72 | HR 65 | Temp 98.1°F | Ht 72.75 in | Wt 182.0 lb

## 2015-12-10 DIAGNOSIS — N400 Enlarged prostate without lower urinary tract symptoms: Secondary | ICD-10-CM | POA: Diagnosis not present

## 2015-12-10 DIAGNOSIS — K2100 Gastro-esophageal reflux disease with esophagitis, without bleeding: Secondary | ICD-10-CM

## 2015-12-10 DIAGNOSIS — Z0000 Encounter for general adult medical examination without abnormal findings: Secondary | ICD-10-CM | POA: Diagnosis not present

## 2015-12-10 DIAGNOSIS — E7800 Pure hypercholesterolemia, unspecified: Secondary | ICD-10-CM | POA: Diagnosis not present

## 2015-12-10 DIAGNOSIS — R6881 Early satiety: Secondary | ICD-10-CM

## 2015-12-10 NOTE — Assessment & Plan Note (Signed)
Discussed healthy diet changes to control cholesterol levels.

## 2015-12-10 NOTE — Assessment & Plan Note (Signed)
Stable off meds. Requests QOY DRE, yearly PSA.

## 2015-12-10 NOTE — Assessment & Plan Note (Signed)
Preventative protocols reviewed and updated unless pt declined. Discussed healthy diet and lifestyle.  

## 2015-12-10 NOTE — Patient Instructions (Addendum)
We will order abdominal ultrasound for feelings of recurrent fullness. You are doing well today Continue medicines as up to now. Consider trial dexilant instead of nexium. Return as needed or in 1 year for next physical.  Health Maintenance, Male A healthy lifestyle and preventative care can promote health and wellness.  Maintain regular health, dental, and eye exams.  Eat a healthy diet. Foods like vegetables, fruits, whole grains, low-fat dairy products, and lean protein foods contain the nutrients you need and are low in calories. Decrease your intake of foods high in solid fats, added sugars, and salt. Get information about a proper diet from your health care provider, if necessary.  Regular physical exercise is one of the most important things you can do for your health. Most adults should get at least 150 minutes of moderate-intensity exercise (any activity that increases your heart rate and causes you to sweat) each week. In addition, most adults need muscle-strengthening exercises on 2 or more days a week.   Maintain a healthy weight. The body mass index (BMI) is a screening tool to identify possible weight problems. It provides an estimate of body fat based on height and weight. Your health care provider can find your BMI and can help you achieve or maintain a healthy weight. For males 20 years and older:  A BMI below 18.5 is considered underweight.  A BMI of 18.5 to 24.9 is normal.  A BMI of 25 to 29.9 is considered overweight.  A BMI of 30 and above is considered obese.  Maintain normal blood lipids and cholesterol by exercising and minimizing your intake of saturated fat. Eat a balanced diet with plenty of fruits and vegetables. Blood tests for lipids and cholesterol should begin at age 2 and be repeated every 5 years. If your lipid or cholesterol levels are high, you are over age 7, or you are at high risk for heart disease, you may need your cholesterol levels checked more  frequently.Ongoing high lipid and cholesterol levels should be treated with medicines if diet and exercise are not working.  If you smoke, find out from your health care provider how to quit. If you do not use tobacco, do not start.  Lung cancer screening is recommended for adults aged 20-80 years who are at high risk for developing lung cancer because of a history of smoking. A yearly low-dose CT scan of the lungs is recommended for people who have at least a 30-pack-year history of smoking and are current smokers or have quit within the past 15 years. A pack year of smoking is smoking an average of 1 pack of cigarettes a day for 1 year (for example, a 30-pack-year history of smoking could mean smoking 1 pack a day for 30 years or 2 packs a day for 15 years). Yearly screening should continue until the smoker has stopped smoking for at least 15 years. Yearly screening should be stopped for people who develop a health problem that would prevent them from having lung cancer treatment.  If you choose to drink alcohol, do not have more than 2 drinks per day. One drink is considered to be 12 oz (360 mL) of beer, 5 oz (150 mL) of wine, or 1.5 oz (45 mL) of liquor.  Avoid the use of street drugs. Do not share needles with anyone. Ask for help if you need support or instructions about stopping the use of drugs.  High blood pressure causes heart disease and increases the risk of stroke. High  blood pressure is more likely to develop in:  People who have blood pressure in the end of the normal range (100-139/85-89 mm Hg).  People who are overweight or obese.  People who are African American.  If you are 28-7 years of age, have your blood pressure checked every 3-5 years. If you are 58 years of age or older, have your blood pressure checked every year. You should have your blood pressure measured twice--once when you are at a hospital or clinic, and once when you are not at a hospital or clinic. Record the  average of the two measurements. To check your blood pressure when you are not at a hospital or clinic, you can use:  An automated blood pressure machine at a pharmacy.  A home blood pressure monitor.  If you are 11-44 years old, ask your health care provider if you should take aspirin to prevent heart disease.  Diabetes screening involves taking a blood sample to check your fasting blood sugar level. This should be done once every 3 years after age 1 if you are at a normal weight and without risk factors for diabetes. Testing should be considered at a younger age or be carried out more frequently if you are overweight and have at least 1 risk factor for diabetes.  Colorectal cancer can be detected and often prevented. Most routine colorectal cancer screening begins at the age of 38 and continues through age 24. However, your health care provider may recommend screening at an earlier age if you have risk factors for colon cancer. On a yearly basis, your health care provider may provide home test kits to check for hidden blood in the stool. A small camera at the end of a tube may be used to directly examine the colon (sigmoidoscopy or colonoscopy) to detect the earliest forms of colorectal cancer. Talk to your health care provider about this at age 76 when routine screening begins. A direct exam of the colon should be repeated every 5-10 years through age 39, unless early forms of precancerous polyps or small growths are found.  People who are at an increased risk for hepatitis B should be screened for this virus. You are considered at high risk for hepatitis B if:  You were born in a country where hepatitis B occurs often. Talk with your health care provider about which countries are considered high risk.  Your parents were born in a high-risk country and you have not received a shot to protect against hepatitis B (hepatitis B vaccine).  You have HIV or AIDS.  You use needles to inject street  drugs.  You live with, or have sex with, someone who has hepatitis B.  You are a man who has sex with other men (MSM).  You get hemodialysis treatment.  You take certain medicines for conditions like cancer, organ transplantation, and autoimmune conditions.  Hepatitis C blood testing is recommended for all people born from [birth year] through 1965 and any individual with known risk factors for hepatitis C.  Healthy men should no longer receive prostate-specific antigen (PSA) blood tests as part of routine cancer screening. Talk to your health care provider about prostate cancer screening.  Testicular cancer screening is not recommended for adolescents or adult males who have no symptoms. Screening includes self-exam, a health care provider exam, and other screening tests. Consult with your health care provider about any symptoms you have or any concerns you have about testicular cancer.  Practice safe sex. Use condoms and  avoid high-risk sexual practices to reduce the spread of sexually transmitted infections (STIs).  You should be screened for STIs, including gonorrhea and chlamydia if:  You are sexually active and are younger than 24 years.  You are older than 24 years, and your health care provider tells you that you are at risk for this type of infection.  Your sexual activity has changed since you were last screened, and you are at an increased risk for chlamydia or gonorrhea. Ask your health care provider if you are at risk.  If you are at risk of being infected with HIV, it is recommended that you take a prescription medicine daily to prevent HIV infection. This is called pre-exposure prophylaxis (PrEP). You are considered at risk if:  You are a man who has sex with other men (MSM).  You are a heterosexual man who is sexually active with multiple partners.  You take drugs by injection.  You are sexually active with a partner who has HIV.  Talk with your health care provider about  whether you are at high risk of being infected with HIV. If you choose to begin PrEP, you should first be tested for HIV. You should then be tested every 3 months for as long as you are taking PrEP.  Use sunscreen. Apply sunscreen liberally and repeatedly throughout the day. You should seek shade when your shadow is shorter than you. Protect yourself by wearing long sleeves, pants, a wide-brimmed hat, and sunglasses year round whenever you are outdoors.  Tell your health care provider of new moles or changes in moles, especially if there is a change in shape or color. Also, tell your health care provider if a mole is larger than the size of a pencil eraser.  A one-time screening for abdominal aortic aneurysm (AAA) and surgical repair of large AAAs by ultrasound is recommended for men aged 63-75 years who are current or former smokers.  Stay current with your vaccines (immunizations).   This information is not intended to replace advice given to you by your health care provider. Make sure you discuss any questions you have with your health care provider.   Document Released: 02/07/2008 Document Revised: 09/01/2014 Document Reviewed: 01/06/2011 Elsevier Interactive Patient Education [employer].

## 2015-12-10 NOTE — Assessment & Plan Note (Addendum)
Chronic issue. longterm (10+ yrs) on nexium 40mg  daily + antihistamine and tums PRN. Last EGD showing superficial gastritis. With endorsed mild early satiety, will check abd [place].  Discussed trial dexilant vs return to GI. Will start with abd [place].

## 2015-12-10 NOTE — Progress Notes (Signed)
BP 110/72 mmHg  Pulse 65  Temp(Src) 98.1 F (36.7 C) (Oral)  Ht 6' 0.75" (1.848 m)  Wt 182 lb (82.555 kg)  BMI 24.17 kg/m2  SpO2 98%   CC: CPE  Subjective:    Patient ID: Benjamin Robles, male    DOB: 09/17/1955, 60 y.o.   MRN: [id number]  HPI: Benjamin Robles is a 60 y.o. male presenting on 12/10/2015 for Annual Exam   GERD - controlled with nexium 40mg  daily + ranitidine 150mg  nightly prn + tums prn. Works on [employer].  EGD 2012 - chronic superficial gastritis with oozing nodule.   Preventative: Colonoscopy 07/2011 - WNL rec rpt 10 yrs [name])  UPPER GASTROINTESTINAL ENDOSCOPY Date: 08/07/2011 chronic superficial gastritis, oozing nodule clipped, neg H pylori [name])  Prostate cancer screening - father with h/o slow growing prostate cancer.H/o BPH with some frequency and nocturia x0-1. Requests DRE spaced out. PSA yearly.  Flu shot - yearly Tetanus shot - 2010 Seat belt use discussed  Sunscreen use discussed - no changing moles on skin, sees derm yearly   Lives with wife, 2 cats and 2 dogs, grown children  Occupation: phone technical support  Activity: no regular exercise routine  Diet: good water, fruits/vegetables daily  Relevant past medical, surgical, family and social history reviewed and updated as indicated. Interim medical history since our last visit reviewed. Allergies and medications reviewed and updated. Current Outpatient Prescriptions on File Prior to Visit  Medication Sig  . calcium carbonate (TUMS - DOSED IN MG ELEMENTAL CALCIUM) 500 MG chewable tablet Chew 1 tablet by mouth as needed.    Marland Kitchen esomeprazole (NEXIUM) 40 MG capsule TAKE 1 CAPSULE DAILY FOR REFLUX  . fluticasone (FLONASE) 50 MCG/ACT nasal spray USE 2 SPRAYS NASALY DAILY  . ketoconazole (NIZORAL) 2 % cream   . magnesium 30 MG tablet Take 30 mg by mouth daily.  . multivitamin (THERAGRAN) per tablet Take 1 tablet by mouth daily.    . psyllium (METAMUCIL) 58.6 % powder Take 1 packet by mouth  daily.   . ranitidine (ZANTAC) 150 MG capsule Take 150 mg by mouth every evening.   No current facility-administered medications on file prior to visit.    Review of Systems  Constitutional: Negative for fever, chills, activity change, appetite change, fatigue and unexpected weight change.  HENT: Negative for hearing loss.   Eyes: Negative for visual disturbance.  Respiratory: Negative for cough, chest tightness, shortness of breath and wheezing.   Cardiovascular: Negative for chest pain, palpitations and leg swelling.  Gastrointestinal: Negative for nausea, vomiting, abdominal pain, diarrhea, constipation, blood in stool and abdominal distention.  Genitourinary: Negative for hematuria and difficulty urinating.  Musculoskeletal: Negative for myalgias, arthralgias and neck pain.  Skin: Negative for rash.  Neurological: Negative for dizziness, seizures, syncope and headaches.  Hematological: Negative for adenopathy. Does not bruise/bleed easily.  Psychiatric/Behavioral: Negative for dysphoric mood. The patient is not nervous/anxious.    Per HPI unless specifically indicated in ROS section     Objective:    BP 110/72 mmHg  Pulse 65  Temp(Src) 98.1 F (36.7 C) (Oral)  Ht 6' 0.75" (1.848 m)  Wt 182 lb (82.555 kg)  BMI 24.17 kg/m2  SpO2 98%  Wt Readings from Last 3 Encounters:  12/10/15 182 lb (82.555 kg)  06/05/15 182 lb 12 oz (82.895 kg)  04/06/15 179 lb (81.194 kg)    Physical Exam  Constitutional: He is oriented to person, place, and time. He appears well-developed and well-nourished. No  distress.  HENT:  Head: Normocephalic and atraumatic.  Right Ear: Hearing, tympanic membrane, external ear and ear canal normal.  Left Ear: Hearing, tympanic membrane, external ear and ear canal normal.  Nose: Nose normal.  Mouth/Throat: Uvula is midline, oropharynx is clear and moist and mucous membranes are normal. No oropharyngeal exudate, posterior oropharyngeal edema or posterior  oropharyngeal erythema.  Eyes: Conjunctivae and EOM are normal. Pupils are equal, round, and reactive to light. No scleral icterus.  Neck: Normal range of motion. Neck supple. No thyromegaly present.  Cardiovascular: Normal rate, regular rhythm, normal heart sounds and intact distal pulses.   No murmur heard. Pulses:      Radial pulses are 2+ on the right side, and 2+ on the left side.  Pulmonary/Chest: Effort normal and breath sounds normal. No respiratory distress. He has no wheezes. He has no rales.  Abdominal: Soft. Bowel sounds are normal. He exhibits no distension and no mass. There is no tenderness. There is no rebound and no guarding.  Musculoskeletal: Normal range of motion. He exhibits no edema.  Lymphadenopathy:    He has no cervical adenopathy.  Neurological: He is alert and oriented to person, place, and time.  CN grossly intact, station and gait intact  Skin: Skin is warm and dry. No rash noted.  Psychiatric: He has a normal mood and affect. His behavior is normal. Judgment and thought content normal.  Nursing note and vitals reviewed.  Results for orders placed or performed in visit on 11/26/15  Lipid panel  Result Value Ref Range   Cholesterol 184 0 - 200 mg/dL   Triglycerides 111.0 0.0 - 149.0 mg/dL   HDL 41.60 >39.00 mg/dL   VLDL 22.2 0.0 - 40.0 mg/dL   LDL Cholesterol 120 (H) 0 - 99 mg/dL   Total CHOL/HDL Ratio 4    NonHDL 142.69   PSA  Result Value Ref Range   PSA 0.75 0.10 - 4.00 ng/mL  Basic metabolic panel  Result Value Ref Range   Sodium 142 135 - 145 mEq/L   Potassium 3.9 3.5 - 5.1 mEq/L   Chloride 106 96 - 112 mEq/L   CO2 29 19 - 32 mEq/L   Glucose, Bld 99 70 - 99 mg/dL   BUN 17 6 - 23 mg/dL   Creatinine, Ser 1.22 0.40 - 1.50 mg/dL   Calcium 9.3 8.4 - 10.5 mg/dL   GFR 64.53 >60.00 mL/min      Assessment & Plan:   Problem List Items Addressed This Visit    Pure hypercholesterolemia    Discussed healthy diet changes to control cholesterol  levels.       GERD (gastroesophageal reflux disease)    Chronic issue. longterm (10+ yrs) on nexium 40mg  daily + antihistamine and tums PRN. Last EGD showing superficial gastritis. With endorsed mild early satiety, will check abd [place].  Discussed trial dexilant vs return to GI. Will start with abd [place].      Relevant Orders   US Abdomen Complete   Healthcare maintenance - Primary    Preventative protocols reviewed and updated unless pt declined. Discussed healthy diet and lifestyle.       BPH (benign prostatic hypertrophy)    Stable off meds. Requests QOY DRE, yearly PSA.        Other Visit Diagnoses    Early satiety        Relevant Orders    US Abdomen Complete        Follow up plan: Return in about 1  year (around 12/09/2016), or as needed, for annual exam, prior fasting for blood work.  Ria Bush, MD

## 2015-12-10 NOTE — Progress Notes (Signed)
Pre visit review using our clinic review tool, if applicable. No additional management support is needed unless otherwise documented below in the visit note. 

## 2015-12-19 ENCOUNTER — Ambulatory Visit
Admission: RE | Admit: 2015-12-19 | Discharge: 2015-12-19 | Disposition: A | Discharge status: Home / Self Care | Payer: Managed Care, Other (non HMO) | Source: Ambulatory Visit | Attending: Family Medicine | Admitting: Family Medicine

## 2015-12-19 DIAGNOSIS — R6881 Early satiety: Secondary | ICD-10-CM | POA: Diagnosis present

## 2015-12-19 DIAGNOSIS — K2100 Gastro-esophageal reflux disease with esophagitis, without bleeding: Secondary | ICD-10-CM

## 2016-12-17 ENCOUNTER — Encounter: Payer: Self-pay | Admitting: Family Medicine

## 2016-12-17 ENCOUNTER — Ambulatory Visit (INDEPENDENT_AMBULATORY_CARE_PROVIDER_SITE_OTHER): Payer: Managed Care, Other (non HMO) | Admitting: Family Medicine

## 2016-12-17 VITALS — BP 116/60 | HR 69 | Temp 98.5°F | Ht 72.75 in | Wt 189.2 lb

## 2016-12-17 DIAGNOSIS — M7541 Impingement syndrome of right shoulder: Secondary | ICD-10-CM | POA: Diagnosis not present

## 2016-12-17 DIAGNOSIS — M7711 Lateral epicondylitis, right elbow: Secondary | ICD-10-CM

## 2016-12-17 DIAGNOSIS — M25512 Pain in left shoulder: Secondary | ICD-10-CM

## 2016-12-17 DIAGNOSIS — M25511 Pain in right shoulder: Secondary | ICD-10-CM | POA: Diagnosis not present

## 2016-12-17 DIAGNOSIS — G8929 Other chronic pain: Secondary | ICD-10-CM

## 2016-12-17 DIAGNOSIS — M7542 Impingement syndrome of left shoulder: Secondary | ICD-10-CM

## 2016-12-17 NOTE — Progress Notes (Signed)
Dr. Frederico Hamman T. Kadia Abaya, MD, [facility] [place], [address] Phone: [phone] Fax: [phone]  12/17/2016  Patient: Benjamin Robles, MRN: [id number], DOB: [birth year], 61 y.o.  Primary Physician:  Ria Bush, MD   Chief Complaint  Patient presents with  . Shoulder Pain    Bilateral-Feels like a catch in both arms   Subjective:   Benjamin Robles is a 62 y.o. very pleasant male patient who presents with the following:  Works out in the morning. On the top of the shoulder B. He is having pain in multiple parts of his upper extremities, notable when he is working out lifting weights. He has pain with abduction, he has some grinding at the top of his shoulder bilaterally, and he has pain at the sternoclavicular joint on the right side and some grinding, with minimal pain.  He also has some pain on the lateral aspect of the elbow.  He does not have any swelling of the hands and wrist, and MCP joints as well as the fingers.  Dull ache down the entirety.  Still with a grind some in the [facility] joint.   Lifting and work will bother.  In the whole elbow.   Past Medical History, Surgical History, Social History, Family History, Problem List, Medications, and Allergies have been reviewed and updated if relevant.  Patient Active Problem List   Diagnosis Date Noted  . BPH (benign prostatic hypertrophy)   . Healthcare maintenance 07/13/2012  . 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 TINNITUS, CHRONIC 09/30/2010  . IRRITABLE BOWEL SYNDROME 03/29/2010  . Pure hypercholesterolemia 08/10/2007  . ANXIETY DEPRESSION 05/12/2007  . Allergic rhinitis 05/03/2007  . GERD (gastroesophageal reflux disease) 05/03/2007    Past Medical History:  Diagnosis Date  . Allergy    SINUS ALLEGIES  . Anticoagulant disorder ([facility])   . Anxiety   . BPH (benign prostatic hypertrophy)    mild  . Bradycardia   . Depression   . GERD (gastroesophageal  reflux disease)   . History of colon polyps    normal colonoscopy 2012  . Pain of right sternoclavicular joint    MRI 06/2012 showing ?erosive arthritis  . Umbilical hernia     Past Surgical History:  Procedure Laterality Date  . COLONOSCOPY  08/07/2011   WNL, rpt 10 yrs  . HERNIA REPAIR     x2  . UPPER GASTROINTESTINAL ENDOSCOPY  08/07/2011   chronic superficial gastritis, oozing nodule clipped, neg H pylori    Social History   Social History  . Marital status: Married    Spouse name: N/A  . Number of children: 2  . Years of education: N/A   Occupational History  . Tech support [employer]   Social History Main Topics  . Smoking status: Former Research scientist (life sciences)  . Smokeless tobacco: Never Used  . Alcohol use Yes     Comment: 1 beer/day  . Drug use: No  . Sexual activity: Not on file   Other Topics Concern  . Not on file   Social History Narrative   Lives with wife, 2 cats and 2 dogs, grown children   Occupation: Air cabin crew support   Activity: no regular exercise routine   Diet: good water, fruits/vegetables daily    Family History  Problem Relation Age of Onset  . Depression Father   . Prostate cancer Father 60    slow growing  . Hypertension Mother   . CAD Neg Hx   .  Stroke Neg Hx   . Diabetes Neg Hx     Allergies  Allergen Reactions  . Amoxicillin-Pot Clavulanate     REACTION: achey, itch    Medication list reviewed and updated in full in [facility].  GEN: No fevers, chills. Nontoxic. Primarily MSK c/o today. MSK: Detailed in the HPI GI: tolerating PO intake without difficulty Neuro: No numbness, parasthesias, or tingling associated. Otherwise the pertinent positives of the ROS are noted above.   Objective:   BP 116/60   Pulse 69   Temp 98.5 F (36.9 C) (Oral)   Ht 6' 0.75" (1.848 m)   Wt 189 lb 4 oz (85.8 kg)   BMI 25.14 kg/m    GEN: WDWN, NAD, Non-toxic, Alert & Oriented x 3 HEENT: Atraumatic, Normocephalic.  Ears and Nose:  No external deformity. EXTR: No clubbing/cyanosis/edema NEURO: Normal gait.  PSYCH: Normally interactive. Conversant. Not depressed or anxious appearing.  Calm demeanor.   Elbow: R Ecchymosis or edema: neg ROM: full flexion, extension, pronation, supination Shoulder ROM: Full Flexion: 5/5 Extension: 5/5, minimal PAINFUL Supination: 5/5, mild PAINFUL Pronation: 5/5 Wrist ext: 5/5 Wrist flexion: 5/5 No gross bony abnormality Varus and Valgus stress: stable ECRB tenderness: YES, TTP Medial epicondyle: NT Lateral epicondyle minimal t  flexion: nt grip: 5/5  sensation intact Tinel's, Elbow: negative   Shoulder: B Inspection: No muscle wasting or winging Ecchymosis/edema: neg  AC joint, scapula, clavicle: NT Cervical spine: NT, full ROM Spurling's: neg Abduction: full, 5/5 Flexion: full, 5/5 IR, full, lift-off: 5/5 ER at neutral: full, 5/5 AC crossover: neg Neer: nt Hawkins: mild pos Drop Test: neg Empty Can: neg Supraspinatus insertion: nt Bicipital groove: NT Speed's: neg Yergason's: neg Sulcus sign: neg Scapular dyskinesis: none C5-T1 intact  Neuro: Sensation intact Grip 5/5   Radiology: *RADIOLOGY REPORT*  Clinical Data: Right clavicle had enlargement  RIGHT CLAVICLE - 2+ VIEWS  Comparison: None.  Findings: Two views of o the right clavicle submitted.  No acute fracture or subluxation.  Mild spurring of distal clavicle.  IMPRESSION: No acute fracture or subluxation.  Mild spurring of distal clavicle.   Original Report Authenticated By: Lahoma Crocker, M.D.  Assessment and Plan:   Chronic pain of both shoulders  Sternoclavicular joint pain, right  Lateral epicondylitis, right elbow  Impingement syndrome of both shoulders  >25 minutes spent in face to face time with patient, >50% spent in counselling or coordination of care   Multiple joint complaints. 61 year old gentleman doing military presses, bench press, and a number of other  things at place the shoulder in a compromised position.  Things are particularly painful right now. I reviewed with him a comprehensive scapular stabilization program as well as some lateral epicondylar rehabilitation as well as grip training. If he has difficulties, certainly would be appropriate to have physical therapy to see the patient as well, but for now he once to try to rehabilitation this on his own.  I did not best answer all of his questions.  Follow-up: No Follow-up on file.  Signed,  Benjamin Deed. Sabir Charters, MD  Patient Instructions  Advice about weightlifting with SHOULDER PROBLEMS:  1. Avoid overhead presses, military presses, clean and jerk, snatch, heavy incline bench presses, heavy bench presses, any kind of chest fly. Heavy lateral raises will hurt.  ALL OF THESE MAKE THE ROTATOR CUFF IMPINGE ON THE BONY ANATOMY ABOVE THEM AND OFTEN CAUSE RECURRENT BURSITIS AND ROTATOR CUFF TENDINITIS.  2. If you have to do bench presses then I would  do the decline press and not lock out at the end of the lift. DUMBBELLS WILL TAKE STRESS OFF THE SHOULDER, SO I RECOMMEND SWITCHING FROM BAR PRESSES TO DUMBBELLS.  Continue with rotator cuff strengthening and scapular stabilizaton. Recommended that you could do all aerobic actvity.  3. All pulls, lat pull downs, pull-ups, mid-back low back, arms should be no problem. 4. Core is fine     Allergies as of 12/17/2016      Reactions   Amoxicillin-pot Clavulanate    REACTION: achey, itch      Medication List       Accurate as of 12/17/16  2:01 PM. Always use your most recent med list.          calcium carbonate 500 MG chewable tablet Commonly known as:  TUMS - dosed in mg elemental calcium Chew 1 tablet by mouth as needed.   esomeprazole 40 MG capsule Commonly known as:  NEXIUM TAKE 1 CAPSULE DAILY FOR REFLUX   fluticasone 50 MCG/ACT nasal spray Commonly known as:  FLONASE USE 2 SPRAYS NASALY DAILY   ketoconazole 2 %  cream Commonly known as:  NIZORAL   magnesium 30 MG tablet Take 30 mg by mouth daily.   multivitamin per tablet Take 1 tablet by mouth daily.   psyllium 58.6 % powder Commonly known as:  METAMUCIL Take 1 packet by mouth daily.   ranitidine 150 MG capsule Commonly known as:  ZANTAC Take 150 mg by mouth every evening.

## 2016-12-17 NOTE — Patient Instructions (Signed)
Advice about weightlifting with SHOULDER PROBLEMS:  1. Avoid overhead presses, military presses, clean and jerk, snatch, heavy incline bench presses, heavy bench presses, any kind of chest fly. Heavy lateral raises will hurt.  ALL OF THESE MAKE THE ROTATOR CUFF IMPINGE ON THE BONY ANATOMY ABOVE THEM AND OFTEN CAUSE RECURRENT BURSITIS AND ROTATOR CUFF TENDINITIS.  2. If you have to do bench presses then I would do the decline press and not lock out at the end of the lift. DUMBBELLS WILL TAKE STRESS OFF THE SHOULDER, SO I RECOMMEND SWITCHING FROM BAR PRESSES TO DUMBBELLS.  Continue with rotator cuff strengthening and scapular stabilizaton. Recommended that you could do all aerobic actvity.  3. All pulls, lat pull downs, pull-ups, mid-back low back, arms should be no problem. 4. Core is fine

## 2016-12-17 NOTE — Progress Notes (Signed)
Pre visit review using our clinic review tool, if applicable. No additional management support is needed unless otherwise documented below in the visit note. 

## 2016-12-27 ENCOUNTER — Other Ambulatory Visit: Payer: Self-pay | Admitting: Family Medicine

## 2016-12-27 DIAGNOSIS — K2100 Gastro-esophageal reflux disease with esophagitis, without bleeding: Secondary | ICD-10-CM

## 2016-12-27 DIAGNOSIS — E7800 Pure hypercholesterolemia, unspecified: Secondary | ICD-10-CM

## 2016-12-27 DIAGNOSIS — N400 Enlarged prostate without lower urinary tract symptoms: Secondary | ICD-10-CM

## 2016-12-27 DIAGNOSIS — Z1159 Encounter for screening for other viral diseases: Secondary | ICD-10-CM

## 2016-12-30 ENCOUNTER — Other Ambulatory Visit (INDEPENDENT_AMBULATORY_CARE_PROVIDER_SITE_OTHER): Payer: Managed Care, Other (non HMO)

## 2016-12-30 DIAGNOSIS — Z1159 Encounter for screening for other viral diseases: Secondary | ICD-10-CM

## 2016-12-30 DIAGNOSIS — N400 Enlarged prostate without lower urinary tract symptoms: Secondary | ICD-10-CM

## 2016-12-30 DIAGNOSIS — E7800 Pure hypercholesterolemia, unspecified: Secondary | ICD-10-CM | POA: Diagnosis not present

## 2016-12-30 DIAGNOSIS — K2100 Gastro-esophageal reflux disease with esophagitis, without bleeding: Secondary | ICD-10-CM

## 2016-12-30 LAB — LIPID PANEL
CHOL/HDL RATIO: 4
CHOLESTEROL: 215 mg/dL — AB (ref 0–200)
HDL: 50.4 mg/dL (ref >39.00)
LDL CALC: 140 mg/dL — AB (ref 0–99)
NONHDL: 165.03
Triglycerides: 127 mg/dL (ref 0.0–149.0)
VLDL: 25.4 mg/dL (ref 0.0–40.0)

## 2016-12-30 LAB — COMPREHENSIVE METABOLIC PANEL
ALBUMIN: 4.7 g/dL (ref 3.5–5.2)
ALT: 14 U/L (ref 0–53)
AST: 16 U/L (ref 0–37)
Alkaline Phosphatase: 66 U/L (ref 39–117)
BUN: 11 mg/dL (ref 6–23)
CHLORIDE: 105 meq/L (ref 96–112)
CO2: 32 meq/L (ref 19–32)
CREATININE: 1.12 mg/dL (ref 0.40–1.50)
Calcium: 9.8 mg/dL (ref 8.4–10.5)
GFR: 70.96 mL/min (ref >60.00)
GLUCOSE: 105 mg/dL — AB (ref 70–99)
POTASSIUM: 4.1 meq/L (ref 3.5–5.1)
SODIUM: 142 meq/L (ref 135–145)
Total Bilirubin: 0.7 mg/dL (ref 0.2–1.2)
Total Protein: 7.1 g/dL (ref 6.0–8.3)

## 2016-12-30 LAB — TSH: TSH: 0.66 u[IU]/mL (ref 0.35–4.50)

## 2016-12-30 LAB — PSA: PSA: 1.65 ng/mL (ref 0.10–4.00)

## 2016-12-30 LAB — VITAMIN B12: Vitamin B-12: 242 pg/mL (ref 211–911)

## 2016-12-31 LAB — HEPATITIS C ANTIBODY: HCV Ab: NEGATIVE

## 2017-01-02 ENCOUNTER — Encounter: Payer: Self-pay | Admitting: Family Medicine

## 2017-01-02 ENCOUNTER — Ambulatory Visit (INDEPENDENT_AMBULATORY_CARE_PROVIDER_SITE_OTHER): Payer: Managed Care, Other (non HMO) | Admitting: Family Medicine

## 2017-01-02 VITALS — BP 106/68 | HR 77 | Temp 98.5°F | Ht 73.0 in | Wt 184.8 lb

## 2017-01-02 DIAGNOSIS — E7800 Pure hypercholesterolemia, unspecified: Secondary | ICD-10-CM | POA: Diagnosis not present

## 2017-01-02 DIAGNOSIS — K2100 Gastro-esophageal reflux disease with esophagitis, without bleeding: Secondary | ICD-10-CM

## 2017-01-02 DIAGNOSIS — N400 Enlarged prostate without lower urinary tract symptoms: Secondary | ICD-10-CM | POA: Diagnosis not present

## 2017-01-02 DIAGNOSIS — Z0000 Encounter for general adult medical examination without abnormal findings: Secondary | ICD-10-CM

## 2017-01-02 DIAGNOSIS — J309 Allergic rhinitis, unspecified: Secondary | ICD-10-CM | POA: Diagnosis not present

## 2017-01-02 DIAGNOSIS — F341 Dysthymic disorder: Secondary | ICD-10-CM

## 2017-01-02 MED ORDER — CYANOCOBALAMIN 500 MCG PO TABS: 500.0000 ug | ORAL_TABLET | Freq: Every day | ORAL | Status: AC

## 2017-01-02 MED ORDER — ESOMEPRAZOLE MAGNESIUM 20 MG PO CPDR: 40.0000 mg | DELAYED_RELEASE_CAPSULE | Freq: Every day | ORAL | Status: DC

## 2017-01-02 NOTE — Assessment & Plan Note (Addendum)
Chronic, stable to slightly elevated today. Discussed diet changes to improve LDL.

## 2017-01-02 NOTE — Assessment & Plan Note (Signed)
Stable period off effexor.

## 2017-01-02 NOTE — Assessment & Plan Note (Signed)
Chronic, stable on 2 nexium 20mg  OTC daily.

## 2017-01-02 NOTE — Patient Instructions (Addendum)
Check with pharmacy or call us in 2-3 months for shingrix (new 2 shot shingles series)  Continue nexium as up to now. Start b12 518mcg daily.  You are doing well today Return as needed or in 1 year for next physical.  Health Maintenance, Male A healthy lifestyle and preventive care is important for your health and wellness. Ask your health care provider about what schedule of regular examinations is right for you. What should I know about weight and diet?  Eat a Healthy Diet  Eat plenty of vegetables, fruits, whole grains, low-fat dairy products, and lean protein.  Do not eat a lot of foods high in solid fats, added sugars, or salt. Maintain a Healthy Weight  Regular exercise can help you achieve or maintain a healthy weight. You should:  Do at least 150 minutes of exercise each week. The exercise should increase your heart rate and make you sweat (moderate-intensity exercise).  Do strength-training exercises at least twice a week. Watch Your Levels of Cholesterol and Blood Lipids  Have your blood tested for lipids and cholesterol every 5 years starting at 61 years of age. If you are at high risk for heart disease, you should start having your blood tested when you are 61 years old. You may need to have your cholesterol levels checked more often if:  Your lipid or cholesterol levels are high.  You are older than 61 years of age.  You are at high risk for heart disease. What should I know about cancer screening? Many types of cancers can be detected early and may often be prevented. Lung Cancer  You should be screened every year for lung cancer if:  You are a current smoker who has smoked for at least 30 years.  You are a former smoker who has quit within the past 15 years.  Talk to your health care provider about your screening options, when you should start screening, and how often you should be screened. Colorectal Cancer  Routine colorectal cancer screening usually begins  at 61 years of age and should be repeated every 5-10 years until you are 61 years old. You may need to be screened more often if early forms of precancerous polyps or small growths are found. Your health care provider may recommend screening at an earlier age if you have risk factors for colon cancer.  Your health care provider may recommend using home test kits to check for hidden blood in the stool.  A small camera at the end of a tube can be used to examine your colon (sigmoidoscopy or colonoscopy). This checks for the earliest forms of colorectal cancer. Prostate and Testicular Cancer  Depending on your age and overall health, your health care provider may do certain tests to screen for prostate and testicular cancer.  Talk to your health care provider about any symptoms or concerns you have about testicular or prostate cancer. Skin Cancer  Check your skin from head to toe regularly.  Tell your health care provider about any new moles or changes in moles, especially if:  There is a change in a mole's size, shape, or color.  You have a mole that is larger than a pencil eraser.  Always use sunscreen. Apply sunscreen liberally and repeat throughout the day.  Protect yourself by wearing long sleeves, pants, a wide-brimmed hat, and sunglasses when outside. What should I know about heart disease, diabetes, and high blood pressure?  If you are 7-19 years of age, have your blood pressure  checked every 3-5 years. If you are 37 years of age or older, have your blood pressure checked every year. You should have your blood pressure measured twice-once when you are at a hospital or clinic, and once when you are not at a hospital or clinic. Record the average of the two measurements. To check your blood pressure when you are not at a hospital or clinic, you can use:  An automated blood pressure machine at a pharmacy.  A home blood pressure monitor.  Talk to your health care provider about your  target blood pressure.  If you are between 64-9 years old, ask your health care provider if you should take aspirin to prevent heart disease.  Have regular diabetes screenings by checking your fasting blood sugar level.  If you are at a normal weight and have a low risk for diabetes, have this test once every three years after the age of 80.  If you are overweight and have a high risk for diabetes, consider being tested at a younger age or more often.  A one-time screening for abdominal aortic aneurysm (AAA) by ultrasound is recommended for men aged 68-75 years who are current or former smokers. What should I know about preventing infection? Hepatitis B  If you have a higher risk for hepatitis B, you should be screened for this virus. Talk with your health care provider to find out if you are at risk for hepatitis B infection. Hepatitis C  Blood testing is recommended for:  Everyone born from [birth year] through 1965.  Anyone with known risk factors for hepatitis C. Sexually Transmitted Diseases (STDs)  You should be screened each year for STDs including gonorrhea and chlamydia if:  You are sexually active and are younger than 61 years of age.  You are older than 61 years of age and your health care provider tells you that you are at risk for this type of infection.  Your sexual activity has changed since you were last screened and you are at an increased risk for chlamydia or gonorrhea. Ask your health care provider if you are at risk.  Talk with your health care provider about whether you are at high risk of being infected with HIV. Your health care provider may recommend a prescription medicine to help prevent HIV infection. What else can I do?  Schedule regular health, dental, and eye exams.  Stay current with your vaccines (immunizations).  Do not use any tobacco products, such as cigarettes, chewing tobacco, and e-cigarettes. If you need help quitting, ask your health care  provider.  Limit alcohol intake to no more than 2 drinks per day. One drink equals 12 ounces of beer, 5 ounces of wine, or 1 ounces of hard liquor.  Do not use street drugs.  Do not share needles.  Ask your health care provider for help if you need support or information about quitting drugs.  Tell your health care provider if you often feel depressed.  Tell your health care provider if you have ever been abused or do not feel safe at home. This information is not intended to replace advice given to you by your health care provider. Make sure you discuss any questions you have with your health care provider. Document Released: 02/07/2008 Document Revised: 04/09/2016 Document Reviewed: 05/15/2015 Elsevier Interactive Patient Education  2017 [employer].

## 2017-01-02 NOTE — Assessment & Plan Note (Signed)
Controlled with flonase.   

## 2017-01-02 NOTE — Progress Notes (Signed)
BP 106/68 (BP Location: Left Arm, Patient Position: Sitting, Cuff Size: Normal)   Pulse 77   Temp 98.5 F (36.9 C) (Oral)   Ht 6\' 1"  (1.854 m)   Wt 184 lb 12 oz (83.8 kg)   SpO2 99%   BMI 24.37 kg/m    CC: CPE Subjective:    Patient ID: Benjamin Robles, male    DOB: [birth year], 61 y.o.   MRN: [id number]  HPI: Benjamin Robles is a 61 y.o. male presenting on 01/02/2017 for Annual Exam   Saw Dr Lorelei Pont last month for ongoing shoulder pain - treated with exercise program.  Takes OTC nexium 20mg  2 daily. Rx was too expensive.   Preventative: Colonoscopy 07/2011 - WNL rec rpt 10 yrs Benjamin Robles)  UPPER GASTROINTESTINAL ENDOSCOPY Date: 08/07/2011 chronic superficial gastritis, oozing nodule clipped, neg H pylori Benjamin Robles)  Prostate cancer screening - father with h/o slow growing prostate cancer.H/o BPH with some frequency and nocturia x0-1. Requests DRE spaced out. PSA yearly.  Flu shot - yearly Tetanus shot - 2010 shingrix - discussed.  Seat belt use discussed  Sunscreen use discussed - no changing moles on skin, sees derm yearly  Ex smoker - quit remotely (10 PY hx) Alcohol - 1 beer daily  Lives with wife, 2 cats and 2 dogs, grown children  Occupation: Air cabin crew support  Activity: aerobics and weight training [facility] [employer] 3x[facility]wk Diet: good water, fruits/vegetables daily  Relevant past medical, surgical, family and social history reviewed and updated as indicated. Interim medical history since our last visit reviewed. Allergies and medications reviewed and updated. Outpatient Medications Prior to Visit  Medication Sig Dispense Refill  . calcium carbonate (TUMS - DOSED IN MG ELEMENTAL CALCIUM) 500 MG chewable tablet Chew 1 tablet by mouth as needed.      . fluticasone (FLONASE) 50 MCG/ACT nasal spray USE 2 SPRAYS NASALY DAILY 48 g 3  . ketoconazole (NIZORAL) 2 % cream     . magnesium 30 MG tablet Take 30 mg by mouth daily.    . multivitamin (THERAGRAN) per tablet Take 1 tablet by mouth  daily.      . psyllium (METAMUCIL) 58.6 % powder Take 1 packet by mouth daily.     Marland Kitchen esomeprazole (NEXIUM) 40 MG capsule TAKE 1 CAPSULE DAILY FOR REFLUX 90 capsule 3  . ranitidine (ZANTAC) 150 MG capsule Take 150 mg by mouth every evening.     No facility-administered medications prior to visit.      Per HPI unless specifically indicated in ROS section below Review of Systems  Constitutional: Negative for activity change, appetite change, chills, fatigue, fever and unexpected weight change.  HENT: Negative for hearing loss.   Eyes: Negative for visual disturbance.  Respiratory: Negative for cough, chest tightness, shortness of breath and wheezing.   Cardiovascular: Negative for chest pain, palpitations and leg swelling.  Gastrointestinal: Negative for abdominal distention, abdominal pain, blood in stool, constipation, diarrhea, nausea and vomiting.  Genitourinary: Negative for difficulty urinating and hematuria.  Musculoskeletal: Negative for arthralgias, myalgias and neck pain.  Skin: Negative for rash.  Neurological: Negative for dizziness, seizures, syncope and headaches.  Hematological: Negative for adenopathy. Does not bruise/bleed easily.  Psychiatric/Behavioral: Negative for dysphoric mood. The patient is not nervous/anxious.        Objective:    BP 106/68 (BP Location: Left Arm, Patient Position: Sitting, Cuff Size: Normal)   Pulse 77   Temp 98.5 F (36.9 C) (Oral)   Ht 6\' 1"  (1.854 m)  Wt 184 lb 12 oz (83.8 kg)   SpO2 99%   BMI 24.37 kg/m   Wt Readings from Last 3 Encounters:  01/02/17 184 lb 12 oz (83.8 kg)  12/17/16 189 lb 4 oz (85.8 kg)  12/10/15 182 lb (82.6 kg)    Physical Exam  Constitutional: He is oriented to person, place, and time. He appears well-developed and well-nourished. No distress.  HENT:  Head: Normocephalic and atraumatic.  Right Ear: Hearing, tympanic membrane, external ear and ear canal normal.  Left Ear: Hearing, tympanic membrane,  external ear and ear canal normal.  Nose: Nose normal.  Mouth/Throat: Uvula is midline, oropharynx is clear and moist and mucous membranes are normal. No oropharyngeal exudate, posterior oropharyngeal edema or posterior oropharyngeal erythema.  Eyes: Conjunctivae and EOM are normal. Pupils are equal, round, and reactive to light. No scleral icterus.  Neck: Normal range of motion. Neck supple. No thyromegaly present.  Cardiovascular: Normal rate, regular rhythm, normal heart sounds and intact distal pulses.   No murmur heard. Pulses:      Radial pulses are 2+ on the right side, and 2+ on the left side.  Pulmonary/Chest: Effort normal and breath sounds normal. No respiratory distress. He has no wheezes. He has no rales.  Abdominal: Soft. Bowel sounds are normal. He exhibits no distension and no mass. There is no tenderness. There is no rebound and no guarding.  Genitourinary: Rectum normal and prostate normal. Rectal exam shows no external hemorrhoid, no internal hemorrhoid, no fissure, no mass, no tenderness and anal tone normal. Prostate is not enlarged (25gm) and not tender.  Musculoskeletal: Normal range of motion. He exhibits no edema.  Lymphadenopathy:    He has no cervical adenopathy.  Neurological: He is alert and oriented to person, place, and time.  CN grossly intact, station and gait intact  Skin: Skin is warm and dry. No rash noted.  Psychiatric: He has a normal mood and affect. His behavior is normal. Judgment and thought content normal.  Nursing note and vitals reviewed.  Results for orders placed or performed in visit on 12/30/16  Lipid panel  Result Value Ref Range   Cholesterol 215 (H) 0 - 200 mg/dL   Triglycerides 127.0 0.0 - 149.0 mg/dL   HDL 50.40 >39.00 mg/dL   VLDL 25.4 0.0 - 40.0 mg/dL   LDL Cholesterol 140 (H) 0 - 99 mg/dL   Total CHOL/HDL Ratio 4    NonHDL 165.03   Comprehensive metabolic panel  Result Value Ref Range   Sodium 142 135 - 145 mEq/L   Potassium  4.1 3.5 - 5.1 mEq/L   Chloride 105 96 - 112 mEq/L   CO2 32 19 - 32 mEq/L   Glucose, Bld 105 (H) 70 - 99 mg/dL   BUN 11 6 - 23 mg/dL   Creatinine, Ser 1.12 0.40 - 1.50 mg/dL   Total Bilirubin 0.7 0.2 - 1.2 mg/dL   Alkaline Phosphatase 66 39 - 117 U/L   AST 16 0 - 37 U/L   ALT 14 0 - 53 U/L   Total Protein 7.1 6.0 - 8.3 g/dL   Albumin 4.7 3.5 - 5.2 g/dL   Calcium 9.8 8.4 - 10.5 mg/dL   GFR 70.96 >60.00 mL/min  TSH  Result Value Ref Range   TSH 0.66 0.35 - 4.50 uIU/mL  PSA  Result Value Ref Range   PSA 1.65 0.10 - 4.00 ng/mL  Hepatitis C antibody  Result Value Ref Range   HCV Ab NEGATIVE NEGATIVE  Vitamin B12  Result Value Ref Range   Vitamin B-12 242 211 - 911 pg/mL      Assessment & Plan:   Problem List Items Addressed This Visit    Allergic rhinitis    Controlled with flonase.       ANXIETY DEPRESSION    Stable period off effexor.       Benign prostatic hyperplasia    Chronic, stable. Overall asxs.       GERD (gastroesophageal reflux disease)    Chronic, stable on 2 nexium 20mg  OTC daily.       Relevant Medications   esomeprazole (NEXIUM) 20 MG capsule   Healthcare maintenance - Primary    Preventative protocols reviewed and updated unless pt declined. Discussed healthy diet and lifestyle.       Pure hypercholesterolemia    Chronic, stable to slightly elevated today. Discussed diet changes to improve LDL.           Follow up plan: Return in about 1 year (around 01/02/2018) for annual exam, prior fasting for blood work.  Ria Bush, MD

## 2017-01-02 NOTE — Assessment & Plan Note (Signed)
Chronic, stable. Overall asxs.

## 2017-01-02 NOTE — Progress Notes (Signed)
Pre visit review using our clinic review tool, if applicable. No additional management support is needed unless otherwise documented below in the visit note. 

## 2017-01-02 NOTE — Assessment & Plan Note (Signed)
Preventative protocols reviewed and updated unless pt declined. Discussed healthy diet and lifestyle.  

## 2017-06-29 ENCOUNTER — Encounter: Payer: Self-pay | Admitting: Family Medicine

## 2017-06-29 ENCOUNTER — Ambulatory Visit (INDEPENDENT_AMBULATORY_CARE_PROVIDER_SITE_OTHER): Payer: Managed Care, Other (non HMO) | Admitting: Family Medicine

## 2017-06-29 VITALS — BP 120/78 | HR 88 | Temp 98.8°F | Ht 73.0 in | Wt 185.8 lb

## 2017-06-29 DIAGNOSIS — J209 Acute bronchitis, unspecified: Secondary | ICD-10-CM | POA: Diagnosis not present

## 2017-06-29 MED ORDER — AZITHROMYCIN 250 MG PO TABS: ORAL_TABLET | ORAL | 0 refills | Status: DC

## 2017-06-29 NOTE — Progress Notes (Signed)
Dr. Frederico Hamman T. Chelby Salata, MD, [facility] [place], [address] Phone: [phone] Fax: [phone]  06/29/2017  Patient: Benjamin Robles, MRN: [id number], DOB: [birth year], 61 y.o.  Primary Physician:  Ria Bush, MD   Chief Complaint  Patient presents with  . Cough    with chest congestion x 1 week  . Hoarse  . Fever    Low grade   Subjective:   Benjamin Robles is a 61 y.o. very pleasant male patient who presents with the following:  Monday last week had a cold, lost his voice Thursday and Friday. Has continued over the weekend and taking dayquil and nyquil. Taking some flonase.   Congestion in his lungs.  He thinks he might of had a fever earlier in the day.   Past Medical History, Surgical History, Social History, Family History, Problem List, Medications, and Allergies have been reviewed and updated if relevant.  Patient Active Problem List   Diagnosis Date Noted  . Benign prostatic hyperplasia   . Healthcare maintenance 07/13/2012  . 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 TINNITUS, CHRONIC 09/30/2010  . IRRITABLE BOWEL SYNDROME 03/29/2010  . Pure hypercholesterolemia 08/10/2007  . ANXIETY DEPRESSION 05/12/2007  . Allergic rhinitis 05/03/2007  . GERD (gastroesophageal reflux disease) 05/03/2007    Past Medical History:  Diagnosis Date  . Allergy    SINUS ALLEGIES  . Anticoagulant disorder ([place])   . Anxiety   . BPH (benign prostatic hypertrophy)    mild  . Bradycardia   . Depression   . GERD (gastroesophageal reflux disease)   . History of colon polyps    normal colonoscopy 2012  . Pain of right sternoclavicular joint    MRI 06/2012 showing ?erosive arthritis  . Umbilical hernia     Past Surgical History:  Procedure Laterality Date  . COLONOSCOPY  08/07/2011   WNL, rpt 10 yrs  . HERNIA REPAIR     x2  . UPPER GASTROINTESTINAL ENDOSCOPY  08/07/2011   chronic superficial gastritis, oozing  nodule clipped, neg H pylori    Social History   Socioeconomic History  . Marital status: Married    Spouse name: Not on file  . Number of children: 2  . Years of education: Not on file  . Highest education level: Not on file  Social Needs  . Financial resource strain: Not on file  . Food insecurity - worry: Not on file  . Food insecurity - inability: Not on file  . Transportation needs - medical: Not on file  . Transportation needs - non-medical: Not on file  Occupational History  . Occupation: Database administrator: [employer]  Tobacco Use  . Smoking status: Former Research scientist (life sciences)  . Smokeless tobacco: Never Used  Substance and Sexual Activity  . Alcohol use: Yes    Comment: 1 beer/day  . Drug use: No  . Sexual activity: Not on file  Other Topics Concern  . Not on file  Social History Narrative   Lives with wife, 2 cats and 2 dogs, grown children   Occupation: Air cabin crew support   Activity: no regular exercise routine   Diet: good water, fruits/vegetables daily    Family History  Problem Relation Age of Onset  . Depression Father   . Prostate cancer Father 60       slow growing  . Hypertension Mother   . CAD Neg Hx   . Stroke Neg Hx   .  Diabetes Neg Hx     Allergies  Allergen Reactions  . Amoxicillin-Pot Clavulanate     REACTION: achey, itch    Medication list reviewed and updated in full in [place].  ROS: GEN: Acute illness details above GI: Tolerating PO intake GU: maintaining adequate hydration and urination Pulm: No SOB Interactive and getting along well at home.  Otherwise, ROS is as per the HPI.  Objective:   BP 120/78   Pulse 88   Temp 98.8 F (37.1 C) (Oral)   Ht 6\' 1"  (1.854 m)   Wt 185 lb 12 oz (84.3 kg)   SpO2 97%   BMI 24.51 kg/m    GEN: A and O x 3. WDWN. NAD.    ENT: Nose clear, ext NML.  No LAD.  No JVD.  TM's clear. Oropharynx clear.  PULM: Normal WOB, no distress. No crackles, wheezes, rhonchi. CV: RRR, no  M/G/R, No rubs, No JVD.   EXT: warm and well-perfused, No c/c/e. PSYCH: Pleasant and conversant.    Laboratory and Imaging Data:  Assessment and Plan:   Acute bronchitis, unspecified organism  Likely viral syndrome and viral bronchitis.  Recommended continued supportive care.  Hold paper prescription and if he worsens after a few more days or develops significant fever then fill.  Follow-up: No Follow-up on file.  Meds ordered this encounter  Medications  . azithromycin (ZITHROMAX) 250 MG tablet    Sig: 2 tabs po on day 1, then 1 tab po for 4 days    Dispense:  6 tablet    Refill:  0   Signed,  Laurieanne Galloway T. Katalia Choma, MD   Allergies as of 06/29/2017      Reactions   Amoxicillin-pot Clavulanate    REACTION: achey, itch      Medication List        Accurate as of 06/29/17 11:59 PM. Always use your most recent med list.          azithromycin 250 MG tablet Commonly known as:  ZITHROMAX 2 tabs po on day 1, then 1 tab po for 4 days   calcium carbonate 500 MG chewable tablet Commonly known as:  TUMS - dosed in mg elemental calcium Chew 1 tablet by mouth as needed.   cyanocobalamin 500 MCG tablet Commonly known as:  V-R VITAMIN B-12 Take 1 tablet (500 mcg total) by mouth daily.   esomeprazole 20 MG capsule Commonly known as:  NEXIUM Take 2 capsules (40 mg total) by mouth daily at 12 noon.   fluticasone 50 MCG/ACT nasal spray Commonly known as:  FLONASE USE 2 SPRAYS NASALY DAILY   ketoconazole 2 % cream Commonly known as:  NIZORAL   magnesium 30 MG tablet Take 30 mg by mouth daily.   multivitamin per tablet Take 1 tablet by mouth daily.   psyllium 58.6 % powder Commonly known as:  METAMUCIL Take 1 packet by mouth daily.

## 2017-07-23 ENCOUNTER — Encounter: Payer: Self-pay | Admitting: Family Medicine

## 2017-07-23 ENCOUNTER — Ambulatory Visit: Payer: Managed Care, Other (non HMO) | Admitting: Family Medicine

## 2017-07-23 ENCOUNTER — Ambulatory Visit (INDEPENDENT_AMBULATORY_CARE_PROVIDER_SITE_OTHER): Payer: Managed Care, Other (non HMO) | Admitting: Family Medicine

## 2017-07-23 VITALS — BP 124/70 | HR 71 | Temp 98.2°F | Wt 185.0 lb

## 2017-07-23 DIAGNOSIS — J3089 Other allergic rhinitis: Secondary | ICD-10-CM

## 2017-07-23 DIAGNOSIS — F4323 Adjustment disorder with mixed anxiety and depressed mood: Secondary | ICD-10-CM | POA: Diagnosis not present

## 2017-07-23 MED ORDER — VENLAFAXINE HCL ER 37.5 MG PO CP24: 37.5000 mg | ORAL_CAPSULE | Freq: Every day | ORAL | 1 refills | Status: DC

## 2017-07-23 MED ORDER — PREDNISONE 20 MG PO TABS: ORAL_TABLET | ORAL | 0 refills | Status: DC

## 2017-07-23 NOTE — Progress Notes (Signed)
BP 124/70 (BP Location: Left Arm, Patient Position: Sitting, Cuff Size: Normal)   Pulse 71   Temp 98.2 F (36.8 C) (Oral)   Wt 185 lb (83.9 kg)   SpO2 97%   BMI 24.41 kg/m    CC: sinus congestion Subjective:    Patient ID: Val Eagle, male    DOB: Mar 26, 1956, 61 y.o.   MRN: [id number]  HPI: ROLLAND STEINERT is a 61 y.o. male presenting on 07/23/2017 for Sinus Problem (since having cold in 05/2017. Sxs just have not cleared up. Thinking it may be allergies); Nasal Congestion; and Cough (productive, clear. )   Mother passed away 12-23-22 night - she was in hospice.   Cold sxs since early October. Self treated with zycam. Seen 06/29/2017 by Dr Lorelei Pont with acute bronchitis, provided with zpack in case worsening. He did take this - and felt better until this week. Note reviewed.   Ongoing clear drainage, L sinus congestion and discomfort/pressure, chest congestion, productive cough of clear sputum.   Denies fevers/chills, ear or tooth pain, ST.   Ex smoker Treating with flonase daily. Also tried mucinex.   GERD overall stable on nexium. Feels mood worsening. Requests restarting effexor.   Relevant past medical, surgical, family and social history reviewed and updated as indicated. Interim medical history since our last visit reviewed. Allergies and medications reviewed and updated. Outpatient Medications Prior to Visit  Medication Sig Dispense Refill  . calcium carbonate (TUMS - DOSED IN MG ELEMENTAL CALCIUM) 500 MG chewable tablet Chew 1 tablet by mouth as needed.      . Cholecalciferol (VITAMIN D3 PO) Take 1 capsule by mouth daily.    . cyanocobalamin (V-R VITAMIN B-12) 500 MCG tablet Take 1 tablet (500 mcg total) by mouth daily.    Marland Kitchen esomeprazole (NEXIUM) 20 MG capsule Take 2 capsules (40 mg total) by mouth daily at 12 noon.    . fluticasone (FLONASE) 50 MCG/ACT nasal spray USE 2 SPRAYS NASALY DAILY 48 g 3  . ketoconazole (NIZORAL) 2 % cream     . magnesium 30 MG tablet Take 30  mg by mouth daily.    . multivitamin (THERAGRAN) per tablet Take 1 tablet by mouth daily.      . psyllium (METAMUCIL) 58.6 % powder Take 1 packet by mouth daily.     Marland Kitchen azithromycin (ZITHROMAX) 250 MG tablet 2 tabs po on day 1, then 1 tab po for 4 days 6 tablet 0   No facility-administered medications prior to visit.      Per HPI unless specifically indicated in ROS section below Review of Systems     Objective:    BP 124/70 (BP Location: Left Arm, Patient Position: Sitting, Cuff Size: Normal)   Pulse 71   Temp 98.2 F (36.8 C) (Oral)   Wt 185 lb (83.9 kg)   SpO2 97%   BMI 24.41 kg/m   Wt Readings from Last 3 Encounters:  07/23/17 185 lb (83.9 kg)  06/29/17 185 lb 12 oz (84.3 kg)  01/02/17 184 lb 12 oz (83.8 kg)    Physical Exam  Constitutional: He appears well-developed and well-nourished. No distress.  HENT:  Head: Normocephalic and atraumatic.  Right Ear: Hearing, tympanic membrane, external ear and ear canal normal.  Left Ear: Hearing, tympanic membrane, external ear and ear canal normal.  Nose: No mucosal edema or rhinorrhea. Right sinus exhibits no maxillary sinus tenderness and no frontal sinus tenderness. Left sinus exhibits no maxillary sinus tenderness and no frontal  sinus tenderness.  Mouth/Throat: Uvula is midline and mucous membranes are normal. Posterior oropharyngeal erythema present. No oropharyngeal exudate, posterior oropharyngeal edema or tonsillar abscesses.  Nasal mucosal erythema  Eyes: Conjunctivae and EOM are normal. Pupils are equal, round, and reactive to light. No scleral icterus.  Neck: Normal range of motion. Neck supple.  Cardiovascular: Normal rate, regular rhythm, normal heart sounds and intact distal pulses.  No murmur heard. Pulmonary/Chest: Effort normal and breath sounds normal. No respiratory distress. He has no wheezes. He has no rales.  Lymphadenopathy:    He has no cervical adenopathy.  Skin: Skin is warm and dry. No rash noted.    Nursing note and vitals reviewed.     Assessment & Plan:  Expressed my condolences for mother's recent passing.  Problem List Items Addressed This Visit    Adjustment disorder with mixed anxiety and depressed mood    Increased stress recently leading to increased upset stomach. Discussed mother's recent passing. Desires to restart low dose effexor which was previously effective to help control prior similar symptoms.       Allergic rhinitis - Primary    Anticipate acute flare of allergic rhinitis as well as component of post infectious cough. No signs of bacterial infection today. rec treat with prednisone taper. I also suggested starting oral antihistamine Update with effect.           Follow up plan: Return if symptoms worsen or fail to improve.  Ria Bush, MD

## 2017-07-23 NOTE — Assessment & Plan Note (Addendum)
Anticipate acute flare of allergic rhinitis as well as component of post infectious cough. No signs of bacterial infection today. rec treat with prednisone taper. I also suggested starting oral antihistamine Update with effect.

## 2017-07-23 NOTE — Patient Instructions (Signed)
I think you have combination of allergic flare and post-infectious inflammation of sinuses. Treat with prednisone course. Continue flonase, add zyrtec or claritin.  Let us know if not improving with this.  Start effexor - sent to mail order pharmacy.

## 2017-07-23 NOTE — Assessment & Plan Note (Signed)
Increased stress recently leading to increased upset stomach. Discussed mother's recent passing. Desires to restart low dose effexor which was previously effective to help control prior similar symptoms.

## 2017-11-15 IMAGING — US US ABDOMEN COMPLETE
1 series · 14 of 25 positions shown · non-contrast
Comparison: No recent prior .

CLINICAL DATA: Esophagitis.  Early satiety.

EXAM:
ABDOMEN ULTRASOUND COMPLETE

[Series 1: us abdomen complete · 0.19mm/px · 14 of 88 slices shown]
[im 1/88]
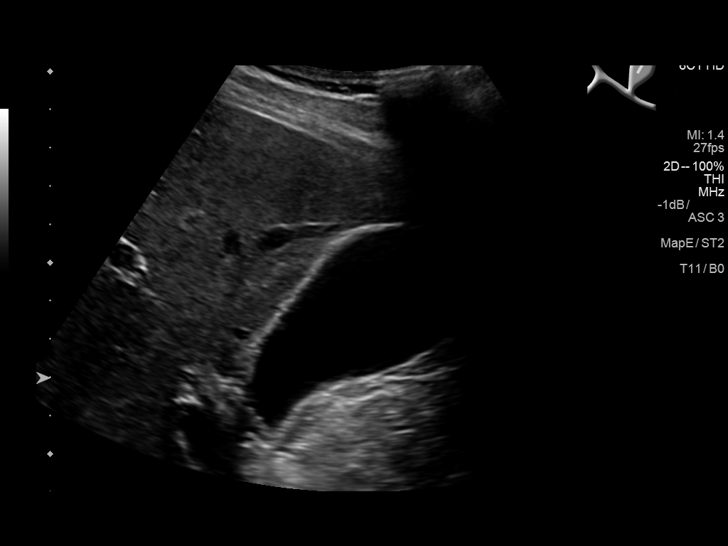
[im 8/88]
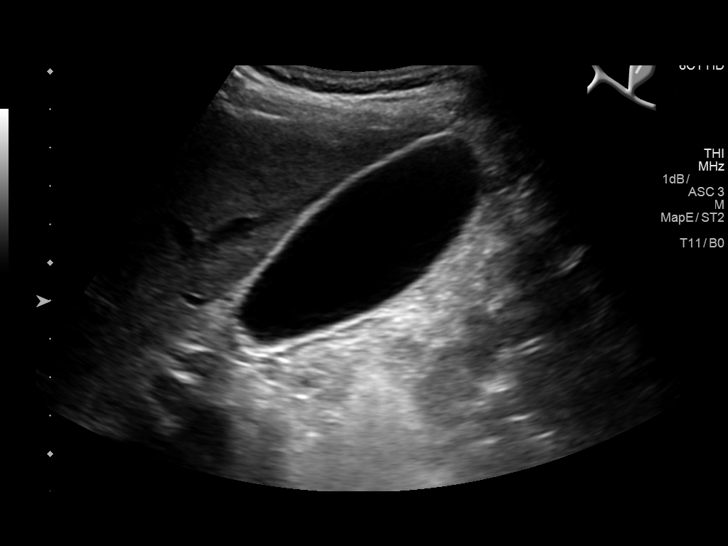
[im 15/88]
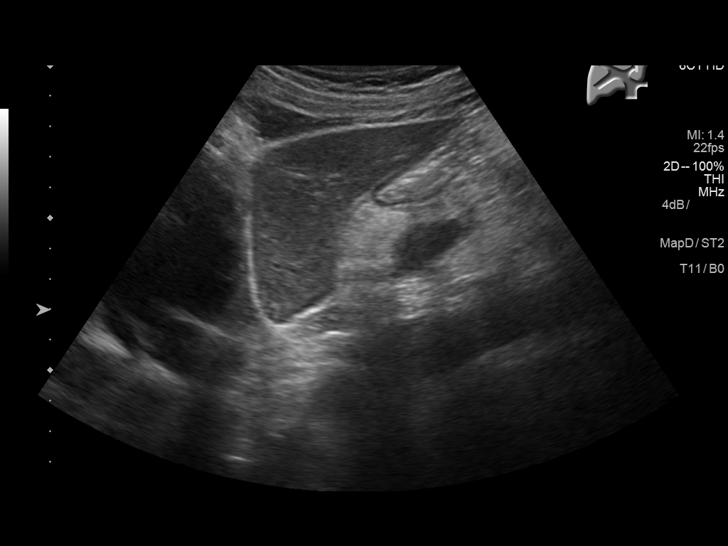
[im 22/88]
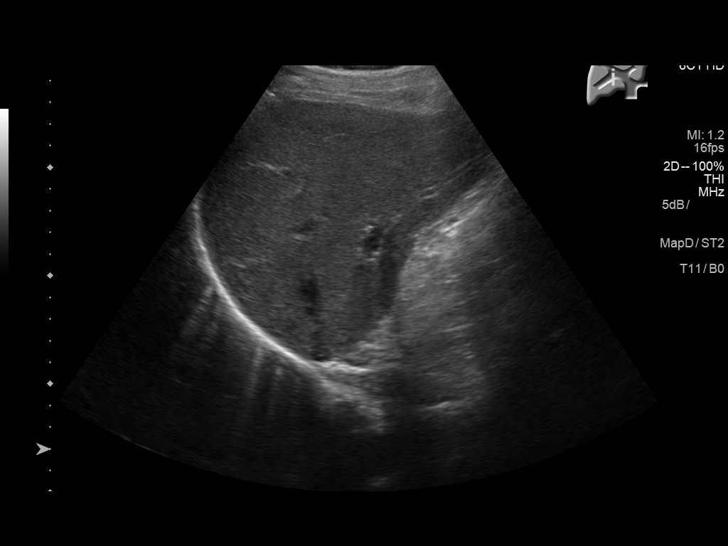
[im 30/88]
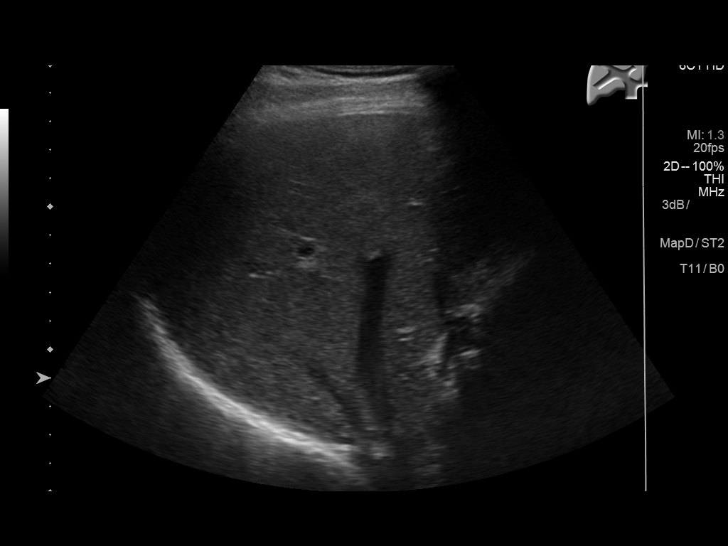
[im 33/88]
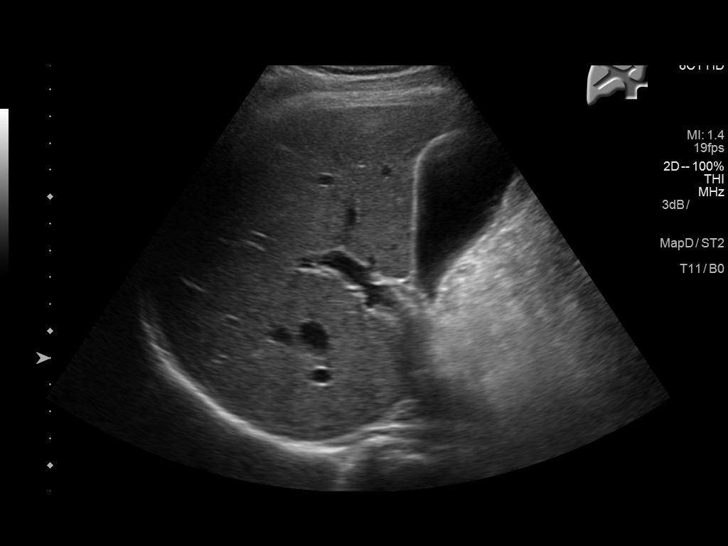
[im 40/88]
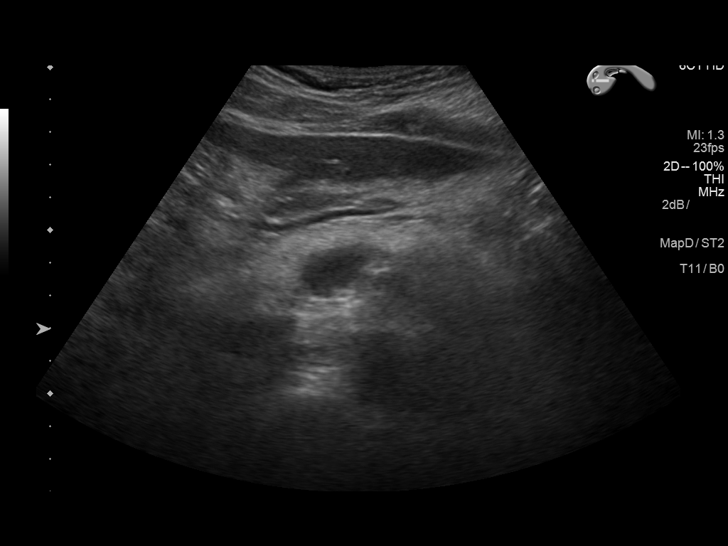
[im 48/88]
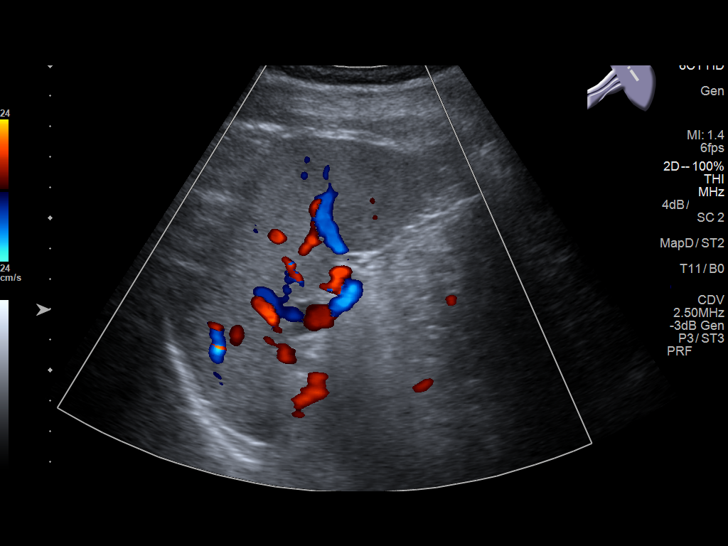
[im 55/88]
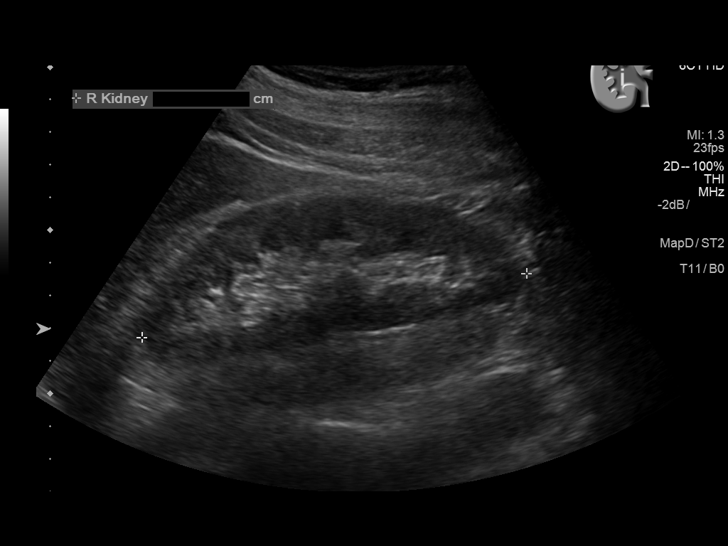
[im 59/88]
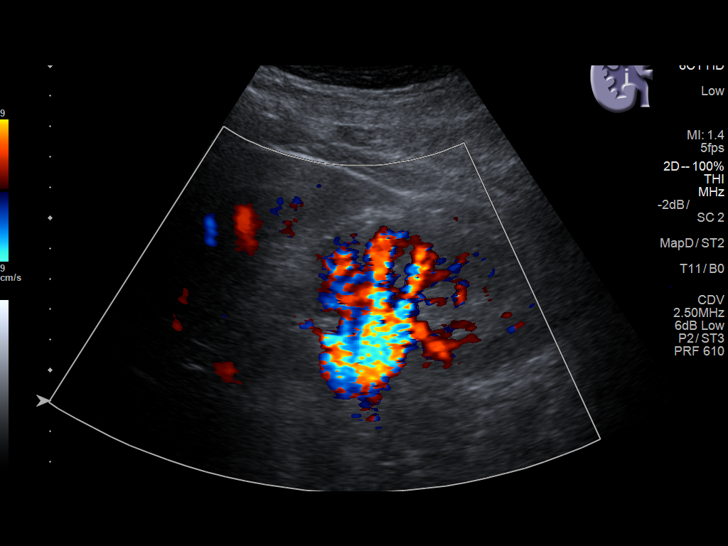
[im 66/88]
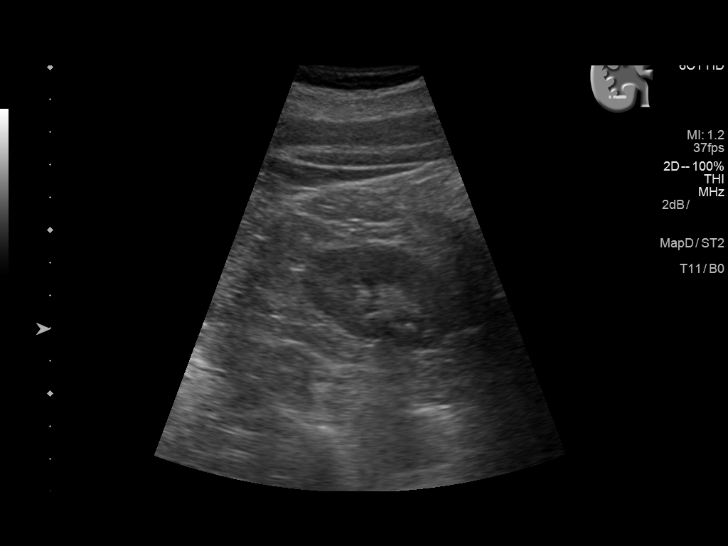
[im 73/88]
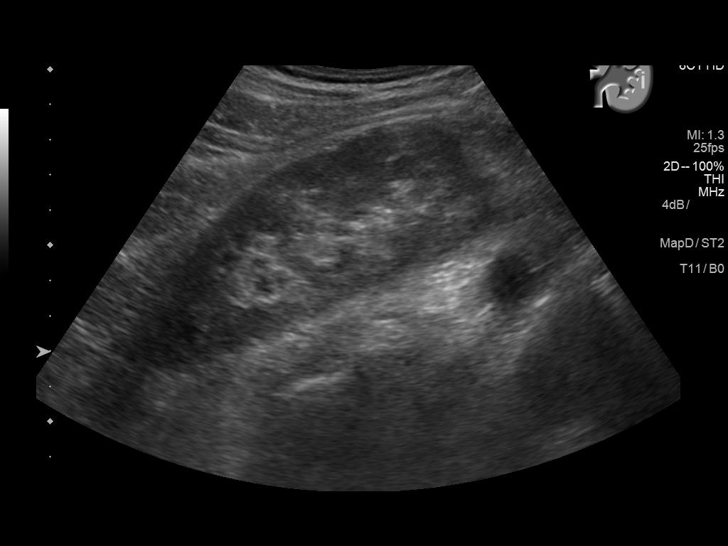
[im 80/88]
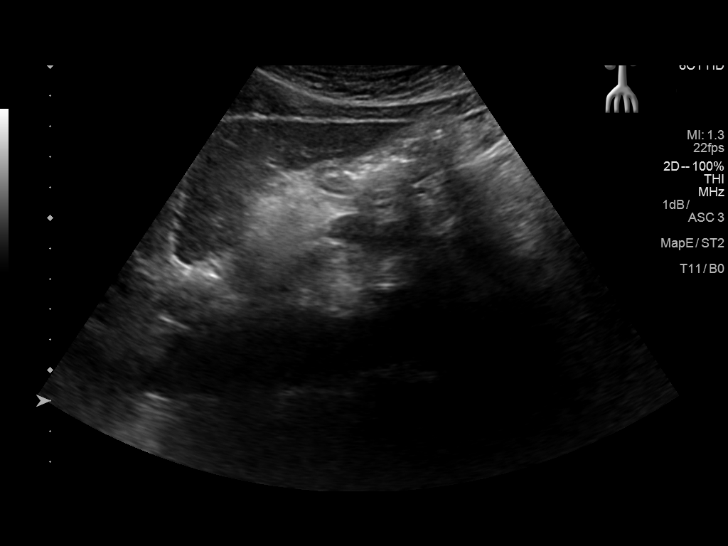
[im 88/88]
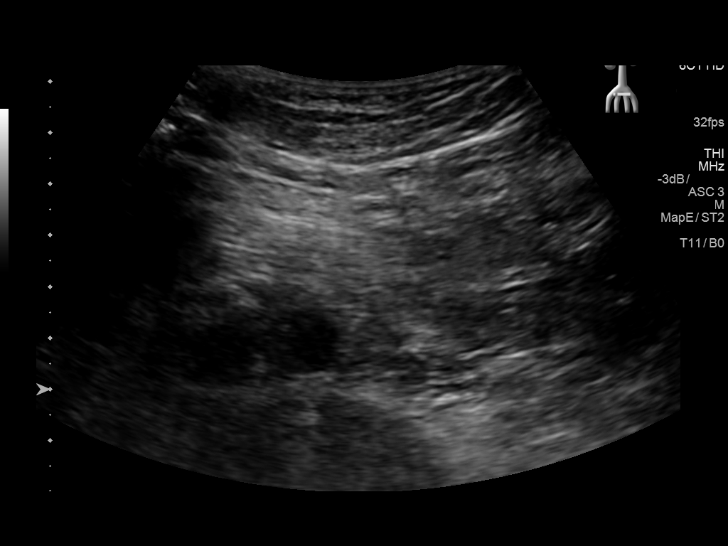

[14 of 25 positions shown; findings below may reference images not displayed]

FINDINGS: Gallbladder: No gallstones or wall thickening visualized. No
sonographic Murphy sign noted by sonographer.

Common bile duct: Diameter: 2.6 mm

Liver: No focal lesion identified. Within normal limits in
parenchymal echogenicity.

IVC: No abnormality visualized.

Pancreas: Visualized portion unremarkable.

Spleen: Size and appearance within normal limits.

Right Kidney: Length: 11.9 cm. Echogenicity within normal limits. No
mass or hydronephrosis visualized.

Left Kidney: Length: 11.9 cm. Echogenicity within normal limits. No
mass or hydronephrosis visualized.

Abdominal aorta: No aneurysm visualized.

Other findings: None.
IMPRESSION: Negative exam.

## 2018-01-08 ENCOUNTER — Other Ambulatory Visit: Payer: Self-pay | Admitting: Family Medicine

## 2018-04-08 ENCOUNTER — Other Ambulatory Visit: Payer: Self-pay | Admitting: Family Medicine

## 2018-11-16 ENCOUNTER — Telehealth: Payer: Self-pay

## 2018-11-16 ENCOUNTER — Ambulatory Visit (INDEPENDENT_AMBULATORY_CARE_PROVIDER_SITE_OTHER): Payer: Managed Care, Other (non HMO) | Admitting: Family Medicine

## 2018-11-16 ENCOUNTER — Encounter: Payer: Self-pay | Admitting: Family Medicine

## 2018-11-16 ENCOUNTER — Other Ambulatory Visit: Payer: Self-pay

## 2018-11-16 VITALS — BP 134/82 | HR 71 | Temp 98.7°F | Ht 73.0 in | Wt 187.1 lb

## 2018-11-16 DIAGNOSIS — M79605 Pain in left leg: Secondary | ICD-10-CM | POA: Insufficient documentation

## 2018-11-16 MED ORDER — GABAPENTIN 100 MG PO CAPS: 100.0000 mg | ORAL_CAPSULE | Freq: Two times a day (BID) | ORAL | 0 refills | Status: DC

## 2018-11-16 NOTE — Assessment & Plan Note (Signed)
Of as of yet unclear cause. ?beginning of L sciatica, early shingles. No signs of cellulitis at this time. Will Rx gabapentin 100mg  BID and reviewed symptoms to watch for. Update [place] over next few days with progress. Pt agrees with plan.

## 2018-11-16 NOTE — Patient Instructions (Signed)
Unclear cause of left leg pain. I don't see signs of bacterial skin infection - watch for spreading redness or fever. Try gabapentin nerve pain medicine in case beginnings of sciatica.  Watch for blistering rash to develop in left leg (that would be signs of shingles).  Keep me updated with how you're feeling.

## 2018-11-16 NOTE — Telephone Encounter (Signed)
Pt said about 2 wks ago noticed area on lt hip that was a bug bite. On 11/14/18 reddish rash started on lt lower leg and has spread to upper lt leg;legs feel sore, no drainage, no red streaks, no warmth to the leg.pt said leg is not swollen but feels tight. Area on lt hip is itchy. Pt not sure what bit pt. No fever, cough,SOB, CP, blisters and pt has not traveled; no known exposure to + covid or flu. Pt scheduled appt with Dr Darnell Level 11/16/18 at 4 PM( that is soonest time pt could come in).

## 2018-11-16 NOTE — Telephone Encounter (Signed)
To see today

## 2018-11-16 NOTE — Progress Notes (Signed)
BP 134/82 (BP Location: Left Arm, Patient Position: Sitting, Cuff Size: Normal)   Pulse 71   Temp 98.7 F (37.1 C) (Oral)   Ht 6\' 1"  (1.854 m)   Wt 187 lb 2 oz (84.9 kg)   SpO2 98%   BMI 24.69 kg/m    CC: insect bite Subjective:    Patient ID: Benjamin Robles, male    DOB: September 11, 1955, 62 y.o.   MRN: [id number]  HPI: Benjamin Robles is a 63 y.o. male presenting on 11/16/2018 for Insect Bite (C/o insect bite on left hip about 2 wks ago. Also, c/o left leg pain, itching and buring sensation that started 11/14/18.)   Last seen here 06/2017.   Insect bite to L hip about 2 wks ago.  2d ago noted increased L leg pain (tightness feeling in groin, medial thigh into medial knee and calf), itching, burning. Some erythema of L leg noted after shower but no obvious rash present. Discomfort noted when walking. Some malaise.   Left lower back pain started bothering him yesterday. H/o L sided sciatica.   No fevers/chills, nausea, joint pains, HA. No streaking redness. No paresthesias or numbness.   No medicine changes, no new supplements. No new foods.  Denies inciting trauma/injury.      Relevant past medical, surgical, family and social history reviewed and updated as indicated. Interim medical history since our last visit reviewed. Allergies and medications reviewed and updated. Outpatient Medications Prior to Visit  Medication Sig Dispense Refill  . calcium carbonate (TUMS - DOSED IN MG ELEMENTAL CALCIUM) 500 MG chewable tablet Chew 1 tablet by mouth as needed.      . Cholecalciferol (VITAMIN D3 PO) Take 1 capsule by mouth daily.    . cyanocobalamin (V-R VITAMIN B-12) 500 MCG tablet Take 1 tablet (500 mcg total) by mouth daily.    Marland Kitchen esomeprazole (NEXIUM) 20 MG capsule Take 2 capsules (40 mg total) by mouth daily at 12 noon.    . fluticasone (FLONASE) 50 MCG/ACT nasal spray USE 2 SPRAYS NASALY DAILY 48 g 3  . ketoconazole (NIZORAL) 2 % cream     . magnesium 30 MG tablet Take 30 mg by mouth  daily.    . multivitamin (THERAGRAN) per tablet Take 1 tablet by mouth daily.      . psyllium (METAMUCIL) 58.6 % powder Take 1 packet by mouth daily.     . predniSONE (DELTASONE) 20 MG tablet Take two tablets daily for 3 days followed by one tablet daily for 4 days 10 tablet 0  . venlafaxine XR (EFFEXOR-XR) 37.5 MG 24 hr capsule TAKE 1 CAPSULE DAILY WITH  BREAKFAST (Patient not taking: Reported on 11/16/2018) 90 capsule 0   No facility-administered medications prior to visit.      Per HPI unless specifically indicated in ROS section below Review of Systems Objective:    BP 134/82 (BP Location: Left Arm, Patient Position: Sitting, Cuff Size: Normal)   Pulse 71   Temp 98.7 F (37.1 C) (Oral)   Ht 6\' 1"  (1.854 m)   Wt 187 lb 2 oz (84.9 kg)   SpO2 98%   BMI 24.69 kg/m   Wt Readings from Last 3 Encounters:  11/16/18 187 lb 2 oz (84.9 kg)  07/23/17 185 lb (83.9 kg)  06/29/17 185 lb 12 oz (84.3 kg)    Physical Exam Vitals signs and nursing note reviewed.  Constitutional:      General: He is not in acute distress.    Appearance:  Normal appearance. He is not ill-appearing.  Musculoskeletal: Normal range of motion.        General: No swelling, tenderness or signs of injury.     Right lower leg: No edema.     Left lower leg: No edema.     Comments:  No midline or paraspinous lumbar back pain  Neg SLR bilaterally. No pain with int/ext rotation at hip. Neg FABER. No knee pain or swelling or popliteal fullness bilaterally Mild discomfort with testing of hip adductors against resistance on left 2+ DP/PT bilaterally  Skin:    General: Skin is warm and dry.     Findings: No bruising, erythema or rash.     Comments: Small scab L lateral hip at site of prior insect bite  Neurological:     General: No focal deficit present.     Mental Status: He is alert.     Sensory: Sensation is intact.     Motor: Motor function is intact. No weakness.     Comments: 5/5 strength BLE  Psychiatric:         Mood and Affect: Mood normal.        Behavior: Behavior normal.       Assessment & Plan:   Problem List Items Addressed This Visit    Left leg pain - Primary    Of as of yet unclear cause. ?beginning of L sciatica, early shingles. No signs of cellulitis at this time. Will Rx gabapentin 100mg  BID and reviewed symptoms to watch for. Update [place] over next few days with progress. Pt agrees with plan.           Meds ordered this encounter  Medications  . gabapentin (NEURONTIN) 100 MG capsule    Sig: Take 1 capsule (100 mg total) by mouth 2 (two) times daily.    Dispense:  30 capsule    Refill:  0   No orders of the defined types were placed in this encounter.   Patient Instructions  Unclear cause of left leg pain. I don't see signs of bacterial skin infection - watch for spreading redness or fever. Try gabapentin nerve pain medicine in case beginnings of sciatica.  Watch for blistering rash to develop in left leg (that would be signs of shingles).  Keep me updated with how you're feeling.   Follow up plan: Return if symptoms worsen or fail to improve.  Ria Bush, MD

## 2019-07-07 ENCOUNTER — Telehealth: Payer: Self-pay | Admitting: Family Medicine

## 2019-07-07 NOTE — Telephone Encounter (Signed)
Patient is requesting a call back  He would like to know what his blood type is.,

## 2019-07-07 NOTE — Telephone Encounter (Signed)
Spoke with pt informing him we do not have that info on file and do not test here.  However, suggested to check with [employer] (if ever donated blood), check with facility where previous surgery was done or he can contact health dept to be tested.  Verbalizes understanding.

## 2019-12-16 ENCOUNTER — Other Ambulatory Visit (INDEPENDENT_AMBULATORY_CARE_PROVIDER_SITE_OTHER): Payer: Managed Care, Other (non HMO)

## 2019-12-16 ENCOUNTER — Other Ambulatory Visit: Payer: Self-pay

## 2019-12-16 ENCOUNTER — Other Ambulatory Visit: Payer: Self-pay | Admitting: Family Medicine

## 2019-12-16 DIAGNOSIS — N400 Enlarged prostate without lower urinary tract symptoms: Secondary | ICD-10-CM | POA: Diagnosis not present

## 2019-12-16 DIAGNOSIS — E7800 Pure hypercholesterolemia, unspecified: Secondary | ICD-10-CM | POA: Diagnosis not present

## 2019-12-16 LAB — COMPREHENSIVE METABOLIC PANEL
ALT: 11 U/L (ref 0–53)
AST: 12 U/L (ref 0–37)
Albumin: 4.5 g/dL (ref 3.5–5.2)
Alkaline Phosphatase: 73 U/L (ref 39–117)
BUN: 12 mg/dL (ref 6–23)
CO2: 32 mEq/L (ref 19–32)
Calcium: 9.7 mg/dL (ref 8.4–10.5)
Chloride: 103 mEq/L (ref 96–112)
Creatinine, Ser: 1.17 mg/dL (ref 0.40–1.50)
GFR: 62.87 mL/min (ref >60.00)
Glucose, Bld: 108 mg/dL — ABNORMAL HIGH (ref 70–99)
Potassium: 4.4 mEq/L (ref 3.5–5.1)
Sodium: 142 mEq/L (ref 135–145)
Total Bilirubin: 0.8 mg/dL (ref 0.2–1.2)
Total Protein: 6.7 g/dL (ref 6.0–8.3)

## 2019-12-16 LAB — LIPID PANEL
Cholesterol: 201 mg/dL — ABNORMAL HIGH (ref 0–200)
HDL: 44.3 mg/dL (ref >39.00)
LDL Cholesterol: 132 mg/dL — ABNORMAL HIGH (ref 0–99)
NonHDL: 157.15
Total CHOL/HDL Ratio: 5
Triglycerides: 124 mg/dL (ref 0.0–149.0)
VLDL: 24.8 mg/dL (ref 0.0–40.0)

## 2019-12-16 LAB — PSA: PSA: 1.21 ng/mL (ref 0.10–4.00)

## 2019-12-21 ENCOUNTER — Other Ambulatory Visit: Payer: Self-pay

## 2019-12-21 ENCOUNTER — Ambulatory Visit (INDEPENDENT_AMBULATORY_CARE_PROVIDER_SITE_OTHER): Payer: Managed Care, Other (non HMO) | Admitting: Family Medicine

## 2019-12-21 ENCOUNTER — Encounter: Payer: Self-pay | Admitting: Family Medicine

## 2019-12-21 VITALS — BP 118/68 | HR 55 | Temp 97.7°F | Ht 72.5 in | Wt 177.1 lb

## 2019-12-21 DIAGNOSIS — Z0000 Encounter for general adult medical examination without abnormal findings: Secondary | ICD-10-CM | POA: Diagnosis not present

## 2019-12-21 DIAGNOSIS — E7800 Pure hypercholesterolemia, unspecified: Secondary | ICD-10-CM | POA: Diagnosis not present

## 2019-12-21 DIAGNOSIS — F4323 Adjustment disorder with mixed anxiety and depressed mood: Secondary | ICD-10-CM

## 2019-12-21 DIAGNOSIS — K219 Gastro-esophageal reflux disease without esophagitis: Secondary | ICD-10-CM

## 2019-12-21 DIAGNOSIS — R350 Frequency of micturition: Secondary | ICD-10-CM

## 2019-12-21 DIAGNOSIS — N401 Enlarged prostate with lower urinary tract symptoms: Secondary | ICD-10-CM

## 2019-12-21 DIAGNOSIS — Z23 Encounter for immunization: Secondary | ICD-10-CM

## 2019-12-21 MED ORDER — ESOMEPRAZOLE MAGNESIUM 20 MG PO CPDR: 20.0000 mg | DELAYED_RELEASE_CAPSULE | Freq: Every day | ORAL | Status: AC

## 2019-12-21 MED ORDER — HYDROXYZINE HCL 25 MG PO TABS: 12.5000 mg | ORAL_TABLET | Freq: Two times a day (BID) | ORAL | 1 refills | Status: DC | PRN

## 2019-12-21 MED ORDER — KETOCONAZOLE 2 % EX CREA: TOPICAL_CREAM | Freq: Two times a day (BID) | CUTANEOUS | 3 refills | Status: DC

## 2019-12-21 NOTE — Assessment & Plan Note (Signed)
Chronic, stable off medication The 10-year ASCVD risk score Mikey Bussing DC Jr., et al., 2013) is: 10.3%   Values used to calculate the score:     Age: 64 years     Sex: Male     Is Non-Hispanic African American: No     Diabetic: No     Tobacco smoker: No     Systolic Blood Pressure: 123456 mmHg     Is BP treated: No     HDL Cholesterol: 44.3 mg/dL     Total Cholesterol: 201 mg/dL

## 2019-12-21 NOTE — Assessment & Plan Note (Signed)
Well managed on nexium OTC once daily.

## 2019-12-21 NOTE — Assessment & Plan Note (Signed)
Chronic, stable off medication. Continue to monitor.

## 2019-12-21 NOTE — Assessment & Plan Note (Signed)
Preventative protocols reviewed and updated unless pt declined. Discussed healthy diet and lifestyle.  

## 2019-12-21 NOTE — Assessment & Plan Note (Addendum)
Ongoing. Anxiety > depression. See PHQ9 and GAD7 scores Previously on effexor. Not interested in daily medication at this time.  Reviewed PRN anxiety medication - discussed benzo vs non benzo med. Will trial hydroxyzine 25mg  1/2-1 tab BID PRN with sedation precautions.

## 2019-12-21 NOTE — Patient Instructions (Addendum)
Tdap today Shingrix today. Schedule nurse visit 2-6 months from now to finish shingles series.  Try hydroxyzine 1/2-1 tablet as needed for anxiety. Let us know how this helps.  Return as needed or in 1 year for next wellness visit.   Health Maintenance, Male Adopting a healthy lifestyle and getting preventive care are important in promoting health and wellness. Ask your health care provider about:  The right schedule for you to have regular tests and exams.  Things you can do on your own to prevent diseases and keep yourself healthy. What should I know about diet, weight, and exercise? Eat a healthy diet   Eat a diet that includes plenty of vegetables, fruits, low-fat dairy products, and lean protein.  Do not eat a lot of foods that are high in solid fats, added sugars, or sodium. Maintain a healthy weight Body mass index (BMI) is a measurement that can be used to identify possible weight problems. It estimates body fat based on height and weight. Your health care provider can help determine your BMI and help you achieve or maintain a healthy weight. Get regular exercise Get regular exercise. This is one of the most important things you can do for your health. Most adults should:  Exercise for at least 150 minutes each week. The exercise should increase your heart rate and make you sweat (moderate-intensity exercise).  Do strengthening exercises at least twice a week. This is in addition to the moderate-intensity exercise.  Spend less time sitting. Even light physical activity can be beneficial. Watch cholesterol and blood lipids Have your blood tested for lipids and cholesterol at 64 years of age, then have this test every 5 years. You may need to have your cholesterol levels checked more often if:  Your lipid or cholesterol levels are high.  You are older than 64 years of age.  You are at high risk for heart disease. What should I know about cancer screening? Many types of  cancers can be detected early and may often be prevented. Depending on your health history and family history, you may need to have cancer screening at various ages. This may include screening for:  Colorectal cancer.  Prostate cancer.  Skin cancer.  Lung cancer. What should I know about heart disease, diabetes, and high blood pressure? Blood pressure and heart disease  High blood pressure causes heart disease and increases the risk of stroke. This is more likely to develop in people who have high blood pressure readings, are of African descent, or are overweight.  Talk with your health care provider about your target blood pressure readings.  Have your blood pressure checked: ? Every 3-5 years if you are 75-58 years of age. ? Every year if you are 64 years old or older.  If you are between the ages of 84 and 50 and are a current or former smoker, ask your health care provider if you should have a one-time screening for abdominal aortic aneurysm (AAA). Diabetes Have regular diabetes screenings. This checks your fasting blood sugar level. Have the screening done:  Once every three years after age 54 if you are at a normal weight and have a low risk for diabetes.  More often and at a younger age if you are overweight or have a high risk for diabetes. What should I know about preventing infection? Hepatitis B If you have a higher risk for hepatitis B, you should be screened for this virus. Talk with your health care provider to find  out if you are at risk for hepatitis B infection. Hepatitis C Blood testing is recommended for:  Everyone born from [birth year] through 1965.  Anyone with known risk factors for hepatitis C. Sexually transmitted infections (STIs)  You should be screened each year for STIs, including gonorrhea and chlamydia, if: ? You are sexually active and are younger than 64 years of age. ? You are older than 64 years of age and your health care provider tells you that you  are at risk for this type of infection. ? Your sexual activity has changed since you were last screened, and you are at increased risk for chlamydia or gonorrhea. Ask your health care provider if you are at risk.  Ask your health care provider about whether you are at high risk for HIV. Your health care provider may recommend a prescription medicine to help prevent HIV infection. If you choose to take medicine to prevent HIV, you should first get tested for HIV. You should then be tested every 3 months for as long as you are taking the medicine. Follow these instructions at home: Lifestyle  Do not use any products that contain nicotine or tobacco, such as cigarettes, e-cigarettes, and chewing tobacco. If you need help quitting, ask your health care provider.  Do not use street drugs.  Do not share needles.  Ask your health care provider for help if you need support or information about quitting drugs. Alcohol use  Do not drink alcohol if your health care provider tells you not to drink.  If you drink alcohol: ? Limit how much you have to 0-2 drinks a day. ? Be aware of how much alcohol is in your drink. In the [place] one drink equals one 12 oz bottle of beer (355 mL), one 5 oz glass of wine (148 mL), or one 1 oz glass of hard liquor (44 mL). General instructions  Schedule regular health, dental, and eye exams.  Stay current with your vaccines.  Tell your health care provider if: ? You often feel depressed. ? You have ever been abused or do not feel safe at home. Summary  Adopting a healthy lifestyle and getting preventive care are important in promoting health and wellness.  Follow your health care provider's instructions about healthy diet, exercising, and getting tested or screened for diseases.  Follow your health care provider's instructions on monitoring your cholesterol and blood pressure. This information is not intended to replace advice given to you by your health care  provider. Make sure you discuss any questions you have with your health care provider. Document Revised: 08/04/2018 Document Reviewed: 08/04/2018 Elsevier Patient Education  2020 [employer].

## 2019-12-21 NOTE — Progress Notes (Signed)
This visit was conducted in person.  BP 118/68 (BP Location: Left Arm, Patient Position: Sitting, Cuff Size: Normal)   Pulse (!) 55   Temp 97.7 F (36.5 C) (Temporal)   Ht 6' 0.5" (1.842 m)   Wt 177 lb 1 oz (80.3 kg)   SpO2 98%   BMI 23.68 kg/m    CC: CPE Subjective:    Patient ID: Benjamin Robles, male    DOB: 04/19/1956, 64 y.o.   MRN: [id number]  HPI: Benjamin Robles is a 64 y.o. male presenting on 12/21/2019 for Annual Exam   Notes ongoing mood trouble. Reviewed healthy stress relieving strategies.   Preventative: Colonoscopy 07/2011 - WNL rec rpt 10 yrs [name])  UPPER GASTROINTESTINAL ENDOSCOPY Date: 08/07/2011 chronic superficial gastritis, oozing nodule clipped, neg H pylori [name])  Prostate cancer screening - father with h/o slow growing prostate cancer.H/o BPH with some frequency and nocturia x1. PSA yearly, DRE every few years.  Flu shot - yearly  Tetanus shot - 2003, 2010  COVID vaccine - Moderna series completed 11/2019 shingrix - discussed. interested.  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 Interim medical history since our last visit reviewed. Allergies and medications reviewed and updated. Outpatient Medications Prior to Visit  Medication Sig Dispense Refill  . calcium carbonate (TUMS - DOSED IN MG ELEMENTAL CALCIUM) 500 MG chewable tablet Chew 1 tablet by mouth as needed.      . Cholecalciferol (VITAMIN D3 PO) Take 1 capsule by mouth daily.    . cyanocobalamin (V-R VITAMIN B-12) 500 MCG tablet Take 1 tablet (500 mcg total) by  mouth daily.    . fluticasone (FLONASE) 50 MCG/ACT nasal spray USE 2 SPRAYS NASALY DAILY 48 g 3  . magnesium 30 MG tablet Take 30 mg by mouth daily.    . multivitamin (THERAGRAN) per tablet Take 1 tablet by mouth daily.      . psyllium (METAMUCIL) 58.6 % powder Take 1 packet by mouth daily.     Marland Kitchen esomeprazole (NEXIUM) 20 MG capsule Take 2 capsules (40 mg total) by mouth daily at 12 noon. (Patient taking differently: Take 20 mg by mouth daily at 12 noon. )    . ketoconazole (NIZORAL) 2 % cream     . gabapentin (NEURONTIN) 100 MG capsule Take 1 capsule (100 mg total) by mouth 2 (two) times daily. 30 capsule 0   No facility-administered medications prior to visit.     Per HPI unless specifically indicated in ROS section below Review of Systems  Constitutional: Negative for activity change, appetite change, chills, fatigue, fever and unexpected weight change.  HENT: Negative for hearing loss.   Eyes: Negative for visual disturbance.  Respiratory: Negative for cough, chest tightness, shortness of breath and wheezing.   Cardiovascular: Negative for chest pain, palpitations and leg swelling.  Gastrointestinal: Negative for abdominal distention, abdominal pain, blood in stool, constipation, diarrhea, nausea and vomiting.  Genitourinary: Negative for difficulty urinating and hematuria.  Musculoskeletal: Negative for arthralgias, myalgias and neck pain.  Skin: Negative for  rash.  Neurological: Positive for dizziness (fleeting). Negative for seizures, syncope and headaches.  Hematological: Negative for adenopathy. Does not bruise/bleed easily.  Psychiatric/Behavioral: Negative for dysphoric mood. The patient is not nervous/anxious.    Objective:  BP 118/68 (BP Location: Left Arm, Patient Position: Sitting, Cuff Size: Normal)   Pulse (!) 55   Temp 97.7 F (36.5 C) (Temporal)   Ht 6' 0.5" (1.842 m)   Wt 177 lb 1 oz (80.3 kg)   SpO2 98%   BMI 23.68 kg/m   Wt Readings from Last 3 Encounters:   12/21/19 177 lb 1 oz (80.3 kg)  11/16/18 187 lb 2 oz (84.9 kg)  07/23/17 185 lb (83.9 kg)      Physical Exam Vitals and nursing note reviewed.  Constitutional:      General: He is not in acute distress.    Appearance: Normal appearance. He is well-developed. He is not ill-appearing.  HENT:     Right Ear: Hearing, tympanic membrane, ear canal and external ear normal.     Left Ear: Hearing, tympanic membrane, ear canal and external ear normal.     Mouth/Throat:     Pharynx: Uvula midline.  Eyes:     General: No scleral icterus.    Extraocular Movements: Extraocular movements intact.     Conjunctiva/sclera: Conjunctivae normal.     Pupils: Pupils are equal, round, and reactive to light.  Cardiovascular:     Rate and Rhythm: Normal rate and regular rhythm.     Pulses: Normal pulses.          Radial pulses are 2+ on the right side and 2+ on the left side.     Heart sounds: Normal heart sounds. No murmur.  Pulmonary:     Effort: Pulmonary effort is normal. No respiratory distress.     Breath sounds: Normal breath sounds. No wheezing, rhonchi or rales.  Abdominal:     General: Abdomen is flat. Bowel sounds are normal. There is no distension.     Palpations: Abdomen is soft. There is no mass.     Tenderness: There is no abdominal tenderness. There is no guarding or rebound.     Hernia: No hernia is present.  Genitourinary:    Prostate: Enlarged (25gm). Not tender and no nodules present.     Rectum: Normal. No mass, tenderness, anal fissure, external hemorrhoid or internal hemorrhoid. Normal anal tone.  Musculoskeletal:        General: Normal range of motion.     Cervical back: Normal range of motion and neck supple.     Right lower leg: No edema.     Left lower leg: No edema.  Lymphadenopathy:     Cervical: No cervical adenopathy.  Skin:    General: Skin is warm and dry.     Findings: No rash.  Neurological:     General: No focal deficit present.     Mental Status: He is  alert and oriented to person, place, and time.     Comments: CN grossly intact, station and gait intact  Psychiatric:        Mood and Affect: Mood normal.        Behavior: Behavior normal.        Thought Content: Thought content normal.        Judgment: Judgment normal.       Results for orders placed or performed in visit on 12/16/19  PSA  Result Value Ref Range   PSA 1.21 0.10 - 4.00  ng/mL  Comprehensive metabolic panel  Result Value Ref Range   Sodium 142 135 - 145 mEq/L   Potassium 4.4 3.5 - 5.1 mEq/L   Chloride 103 96 - 112 mEq/L   CO2 32 19 - 32 mEq/L   Glucose, Bld 108 (H) 70 - 99 mg/dL   BUN 12 6 - 23 mg/dL   Creatinine, Ser 1.17 0.40 - 1.50 mg/dL   Total Bilirubin 0.8 0.2 - 1.2 mg/dL   Alkaline Phosphatase 73 39 - 117 U/L   AST 12 0 - 37 U/L   ALT 11 0 - 53 U/L   Total Protein 6.7 6.0 - 8.3 g/dL   Albumin 4.5 3.5 - 5.2 g/dL   GFR 62.87 >60.00 mL/min   Calcium 9.7 8.4 - 10.5 mg/dL  Lipid panel  Result Value Ref Range   Cholesterol 201 (H) 0 - 200 mg/dL   Triglycerides 124.0 0.0 - 149.0 mg/dL   HDL 44.30 >39.00 mg/dL   VLDL 24.8 0.0 - 40.0 mg/dL   LDL Cholesterol 132 (H) 0 - 99 mg/dL   Total CHOL/HDL Ratio 5    NonHDL 157.15    Depression screen PHQ 2/9 12/21/2019  Decreased Interest 1  Down, Depressed, Hopeless 0  PHQ - 2 Score 1  Altered sleeping 3  Tired, decreased energy 1  Change in appetite 1  Feeling bad or failure about yourself  0  Trouble concentrating 0  Moving slowly or fidgety/restless 0  Suicidal thoughts 0  PHQ-9 Score 6    GAD 7 : Generalized Anxiety Score 12/21/2019  Nervous, Anxious, on Edge 2  Control/stop worrying 2  Worry too much - different things 2  Trouble relaxing 1  Restless 0  Easily annoyed or irritable 0  Afraid - awful might happen 0  Total GAD 7 Score 7   Assessment & Plan:  This visit occurred during the SARS-CoV-2 public health emergency.  Safety protocols were in place, including screening questions prior to the  visit, additional usage of staff PPE, and extensive cleaning of exam room while observing appropriate contact time as indicated for disinfecting solutions.   Problem List Items Addressed This Visit    Pure hypercholesterolemia    Chronic, stable off medication The 10-year ASCVD risk score Mikey Bussing DC Jr., et al., 2013) is: 10.3%   Values used to calculate the score:     Age: 82 years     Sex: Male     Is Non-Hispanic African American: No     Diabetic: No     Tobacco smoker: No     Systolic Blood Pressure: 123456 mmHg     Is BP treated: No     HDL Cholesterol: 44.3 mg/dL     Total Cholesterol: 201 mg/dL       Healthcare maintenance - Primary    Preventative protocols reviewed and updated unless pt declined. Discussed healthy diet and lifestyle.       GERD (gastroesophageal reflux disease)    Well managed on nexium OTC once daily.       Relevant Medications   esomeprazole (NEXIUM) 20 MG capsule   Benign prostatic hyperplasia    Chronic, stable off medication. Continue to monitor.       Adjustment disorder with mixed anxiety and depressed mood    Ongoing. Anxiety > depression. See PHQ9 and GAD7 scores Previously on effexor. Not interested in daily medication at this time.  Reviewed PRN anxiety medication - discussed benzo vs non benzo med. Will trial hydroxyzine 25mg   1/2-1 tab BID PRN with sedation precautions.        Other Visit Diagnoses    Need for Tdap vaccination       Relevant Orders   Tdap vaccine greater than or equal to 7yo IM (Completed)   Need for shingles vaccine       Relevant Orders   Varicella-zoster vaccine IM (Completed)       Meds ordered this encounter  Medications  . ketoconazole (NIZORAL) 2 % cream    Sig: Apply topically 2 (two) times daily.    Dispense:  30 g    Refill:  3  . esomeprazole (NEXIUM) 20 MG capsule    Sig: Take 1 capsule (20 mg total) by mouth daily at 12 noon.  . hydrOXYzine (ATARAX/VISTARIL) 25 MG tablet    Sig: Take 0.5-1  tablets (12.5-25 mg total) by mouth 2 (two) times daily as needed for anxiety (sedation precautions).    Dispense:  30 tablet    Refill:  1   Orders Placed This Encounter  Procedures  . Tdap vaccine greater than or equal to 7yo IM  . Varicella-zoster vaccine IM    Patient instructions: Tdap today Shingrix today. Schedule nurse visit 2-6 months from now to finish shingles series.  Try hydroxyzine 1/2-1 tablet as needed for anxiety. Let us know how this helps.  Return as needed or in 1 year for next wellness visit.   Follow up plan: Return in about 1 year (around 12/20/2020) for annual exam, prior fasting for blood work.  Ria Bush, MD

## 2020-06-18 ENCOUNTER — Telehealth: Payer: Self-pay | Admitting: Family Medicine

## 2020-06-18 NOTE — Telephone Encounter (Signed)
Pt scheduled for NV on 06/21/20 at 8:30.

## 2020-06-18 NOTE — Telephone Encounter (Signed)
Per pt's chart, he received 1st Shingix dose 12/21/19.  Second dose due 2-6 mos after 1st dose, so pt is due.   Lvm asking pt to call back.  Need to schedule NV for 2nd shingles shot.

## 2020-06-18 NOTE — Telephone Encounter (Signed)
Pt called wanting to know if he is due to get 2nd shingles vaccine  Ok to schedule

## 2020-06-21 ENCOUNTER — Ambulatory Visit (INDEPENDENT_AMBULATORY_CARE_PROVIDER_SITE_OTHER): Payer: Managed Care, Other (non HMO)

## 2020-06-21 DIAGNOSIS — Z23 Encounter for immunization: Secondary | ICD-10-CM | POA: Diagnosis not present

## 2020-06-21 NOTE — Progress Notes (Deleted)
Per orders of Dr. Ria Bush, injection of Shingles #2 given by Francella Solian in {Left/right:33004} deltoid. Patient tolerated injection well. Vis given and all questions answered. [facility] updated.

## 2020-07-27 ENCOUNTER — Encounter: Payer: Self-pay | Admitting: Family Medicine

## 2020-07-30 ENCOUNTER — Encounter: Payer: Self-pay | Admitting: Family Medicine

## 2020-07-30 MED ORDER — SILDENAFIL CITRATE 50 MG PO TABS: 50.0000 mg | ORAL_TABLET | Freq: Every day | ORAL | 0 refills | Status: DC | PRN

## 2020-09-06 ENCOUNTER — Other Ambulatory Visit: Payer: Self-pay | Admitting: Family Medicine

## 2020-10-29 ENCOUNTER — Other Ambulatory Visit: Payer: Self-pay | Admitting: Family Medicine

## 2020-10-29 NOTE — Telephone Encounter (Signed)
Refill request sildenafil Last refill 09/06/20 #10 Last office visit 12/21/19 No upcoming appointment

## 2021-03-11 ENCOUNTER — Ambulatory Visit
Admission: EM | Admit: 2021-03-11 | Discharge: 2021-03-11 | Disposition: A | Discharge status: Home / Self Care | Payer: Managed Care, Other (non HMO) | Attending: Emergency Medicine | Admitting: Emergency Medicine

## 2021-03-11 ENCOUNTER — Telehealth: Payer: Self-pay | Admitting: *Deleted

## 2021-03-11 ENCOUNTER — Encounter: Payer: Self-pay | Admitting: Emergency Medicine

## 2021-03-11 ENCOUNTER — Other Ambulatory Visit: Payer: Self-pay

## 2021-03-11 DIAGNOSIS — U071 COVID-19: Secondary | ICD-10-CM | POA: Diagnosis not present

## 2021-03-11 NOTE — ED Provider Notes (Signed)
Benjamin Robles:   Chief Robles  Patient presents with   Nasal Congestion   Sore Throat   Cough     SUBJECTIVE/HPI:   Sore Throat  Cough A very pleasant 65 y.o.Male presents today with nasal congestion, sore throat, cough darted on Saturday evening.  Patient states that he tested negative for COVID-19 on Sunday morning and positive last night.  Patient reports a "dry congestion".  He also reports a headache which has improved.  Patient states he has not taken any medication at home.  Patient states that his wife tested positive for COVID-19 on Saturday morning after meeting with a friend on Thursday.  Patient states that he needs a COVID-19 PCR for work.  No chest pain, dizziness, fainting or shortness of breath reported.   has a past medical history of Allergy, Anticoagulant disorder ([place]), Anxiety, BPH (benign prostatic hypertrophy), Bradycardia, Depression, GERD (gastroesophageal reflux disease), History of colon polyps, Pain of right sternoclavicular joint, and Umbilical hernia.  ROS:  Review of Systems  Respiratory:  Positive for cough.   See Subjective/HPI Medications, Allergies and Problem List personally reviewed in Epic today OBJECTIVE:   Vitals:   03/11/21 1045  BP: (!) 153/81  Pulse: 71  Resp: 20  Temp: 98.5 F (36.9 C)  SpO2: 97%    Physical Exam   General: Appears well-developed and well-nourished. No acute distress.  HEENT Head: Normocephalic and atraumatic. Ears: Hearing grossly intact, no drainage or visible deformity.  Nose: No nasal deviation. Mouth/Throat: No stridor or tracheal deviation.  Non erythematous posterior pharynx noted with clear drainage present.  No white patchy exudate noted. Eyes: Conjunctivae and EOM are normal. No eye drainage or scleral icterus bilaterally.  Neck: Normal range of motion, neck is supple.  Cardiovascular: Normal rate. Regular rhythm; no murmurs, gallops, or rubs.  Pulm/Chest: No respiratory distress. Breath  sounds normal bilaterally without wheezes, rhonchi, or rales.  Neurological: Alert and oriented to person, place, and time.  Skin: Skin is warm and dry.  No rashes, lesions, abrasions or bruising noted to skin.   Psychiatric: Normal mood, affect, behavior, and thought content.   Vital signs and nursing note reviewed.   Patient stable and cooperative with examination. PROCEDURES:    LABS/X-RAYS/EKG/MEDS:   No results found for any visits on 03/11/21.  MEDICAL DECISION MAKING:   Patient presents with nasal congestion, sore throat, cough darted on Saturday evening.  Patient states that he tested negative for COVID-19 on Sunday morning and positive last night.  Patient reports a "dry congestion".  He also reports a headache which has improved.  Patient states he has not taken any medication at home.  Patient states that his wife tested positive for COVID-19 on Saturday morning after meeting with a friend on Thursday.  Patient states that he needs a COVID-19 PCR for work.  No chest pain, dizziness, fainting or shortness of breath reported.  Chart review completed.  Positive at home COVID-19 test, likely positive PCR.  Work note provided for 5-day quarantine period.  Discussed antiviral therapy, but the patient does not have any history of immunocompromising conditions for which would warrant this medication at this time.  Advised of strict emergency department precautions for worsening of symptoms.  Return as needed.  Patient verbalized understanding and agreed with treatment plan.  Patient stable upon discharge. ASSESSMENT/PLAN:  1. COVID-19 virus detected - Novel Coronavirus, NAA ([facility]); Standing - Novel Coronavirus, NAA ([facility])  No orders of the defined types were placed in this encounter.  Instructions about new medications and side effects provided.  Plan:   Discharge Instructions      We will call you with any positive results from your COVID-19 testing completed in clinic  today.  If you do not receive a phone call from [place] within the next 2-3 days, check your MyChart for up-to-date health information related to testing completed in clinic today.  For most people this is a self-limiting process and can take anywhere from 7 - 10 days to start feeling better. A cough can last up to 3 weeks. Pay special attention to handwashing as this can help prevent the spread of the virus.   Always read the labels of cough and cold medications as they may contain some of the ingredients below.  Rest, push lots of fluids (especially water), and utilize supportive care for symptoms. You may take acetaminophen (Tylenol) every 4-6 hours and ibuprofen every 6-8 hours for muscle pain, joint pain, headaches (you may also alternate these medications). Mucinex (guaifenesin) may be taken over the counter for cough as needed can loosen phlegm. Please read the instructions and take as directed.  Sudafed (pseudophedrine) is sold behind the counter and can help reduce nasal pressure; avoid taking this if you have high blood pressure or feel jittery. Sudafed PE (phenylephrine) can be a helpful, short-term, over-the-counter alternative to limit side effects or if you have high blood pressure.  Flonase nasal spray can help alleviate congestion and sinus pressure. Many patients choose Afrin as a nasal decongestant; do not use for more than 3 days for risk of rebound (increased symptoms after stopping medication).  Saline nasal sprays or rinses can also help nasal congestion (use bottled or sterile water). Warm tea with lemon and honey can sooth sore throat and cough, as can cough drops.   Return to clinic for high fever not improving with medications, chest pain, difficulty breathing, non-stop vomiting, or coughing blood. Follow-up with your primary care provider if symptoms do not improve as expected in the next 5-7 days.        A copy of these instructions have been given to the patient or  responsible adult who demonstrated the ability to learn, asked appropriate questions, and verbalized understanding of the plan of care.  There were no barriers to learning identified.    Serafina Royals, FNP-C 03/11/21  This note was partially made with the aid of speech-to-text dictation; typographical errors are not intentional.    Serafina Royals, [place] 03/11/21 1109

## 2021-03-11 NOTE — Discharge Instructions (Addendum)

## 2021-03-11 NOTE — Telephone Encounter (Signed)
Noted. [facility] records reviewed.

## 2021-03-11 NOTE — ED Triage Notes (Signed)
Pt presents today with c/o of testing positive to Covid on home test this morning. He has scratchy throat and cough. He called PCP who recommended to seek care here.   He reports work requires PCR test.  He is also inquiring about Antiviral medications.

## 2021-03-11 NOTE — Telephone Encounter (Signed)
Patient left a voicemail stating that he tested positive for covid this morning with a home test.   Called patient back and was advised that his wife tested positive last week. Patient denies a fever. Patient stated that he started with symptoms Saturday and did a test Sunday that was negative. Patient stated that he has a dry cough, headache, scratchy throat. Patient denies SOB or difficulty breathing. . Patient stated that he just talked with his work [employer] scientist and she told him that he needs to go to an Urgent Care to get a test and a work note. Patient was given information on the [facility]. Patient stated that he is going to head to the UC now. Patient was given ER precautions and he verbalized understanding.

## 2021-03-13 LAB — NOVEL CORONAVIRUS, NAA: SARS-CoV-2, NAA: DETECTED — AB

## 2021-03-13 LAB — SARS-COV-2, NAA 2 DAY TAT

## 2021-03-27 ENCOUNTER — Other Ambulatory Visit: Payer: Self-pay

## 2021-03-27 ENCOUNTER — Encounter: Payer: Self-pay | Admitting: Family Medicine

## 2021-03-27 ENCOUNTER — Ambulatory Visit
Admission: RE | Admit: 2021-03-27 | Discharge: 2021-03-27 | Disposition: A | Discharge status: Home / Self Care | Payer: Managed Care, Other (non HMO) | Source: Ambulatory Visit | Attending: Sports Medicine | Admitting: Sports Medicine

## 2021-03-27 VITALS — BP 142/76 | HR 67 | Temp 98.7°F | Resp 18 | Ht 72.5 in | Wt 185.0 lb

## 2021-03-27 DIAGNOSIS — J9811 Atelectasis: Secondary | ICD-10-CM

## 2021-03-27 DIAGNOSIS — R0982 Postnasal drip: Secondary | ICD-10-CM | POA: Diagnosis not present

## 2021-03-27 DIAGNOSIS — R0789 Other chest pain: Secondary | ICD-10-CM | POA: Diagnosis not present

## 2021-03-27 DIAGNOSIS — J069 Acute upper respiratory infection, unspecified: Secondary | ICD-10-CM | POA: Diagnosis not present

## 2021-03-27 DIAGNOSIS — R059 Cough, unspecified: Secondary | ICD-10-CM

## 2021-03-27 DIAGNOSIS — U099 Post covid-19 condition, unspecified: Secondary | ICD-10-CM

## 2021-03-27 MED ORDER — ALBUTEROL SULFATE HFA 108 (90 BASE) MCG/ACT IN AERS
1.0000 | INHALATION_SPRAY | Freq: Four times a day (QID) | RESPIRATORY_TRACT | 0 refills | Status: DC | PRN
Start: 2021-03-27 — End: 2021-11-14

## 2021-03-27 NOTE — Telephone Encounter (Addendum)
I spoke with pt; pt having mid chest tightness about 6 " below neck;pt was recently + covid. Pt does not have SOB oxygen level is 97%. Pt has not taken temp but feels warm on and off. Pt has dry cough and feels like congestion or something in chest but will not come up. No wheezing. Pt said not really pain but feels uncomfortable. Pt will go to [place] for eval and possible CXR or other testing if needed. Sending note to Dr Darnell Level and Lattie Haw CMA. I scheduled pt on line appt with [facility] in [place] for 03/27/21 at 1 PM. UC & ED precautions given and pt voiced understanding.

## 2021-03-27 NOTE — ED Provider Notes (Signed)
[facility]    CSN: [id number] Arrival date & time: 03/27/21  1245      History   Chief Complaint Chief Complaint  Patient presents with   Cough    HPI Benjamin Robles is a 65 y.o. male.   65 year old male who presents for evaluation of mild chest tightness, drainage, and a feeling that he needs to cough something up.  He is also having associated cough.  His symptoms began back several weeks ago.  He took a home test for COVID that was positive.  I did being seen at an urgent care and had a positive test on 03/11/2021.  At that time he was having fever.  He is not having that at all now.  No fever shakes chills.  No nausea vomiting or diarrhea.  No chest pain or shortness of breath.  Normally is seen by Dr. Danise Mina at [facility].  He was unable to get an appointment.  No red flag signs or symptoms elicited on history.  He was hoping that he can get something to kind of clear up his symptoms at the present time.   Past Medical History:  Diagnosis Date   Allergy    SINUS ALLEGIES   Anticoagulant disorder ([facility])    Anxiety    BPH (benign prostatic hypertrophy)    mild   Bradycardia    Depression    GERD (gastroesophageal reflux disease)    History of colon polyps    normal colonoscopy 2012   Pain of right sternoclavicular joint    MRI 06/2012 showing ?erosive arthritis   Umbilical hernia     Patient Active Problem List   Diagnosis Date Noted   Left leg pain 11/16/2018   Benign prostatic hyperplasia    Healthcare maintenance 07/13/2012   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   TINNITUS, CHRONIC 09/30/2010   IRRITABLE BOWEL SYNDROME 03/29/2010   Pure hypercholesterolemia 08/10/2007   Adjustment disorder with mixed anxiety and depressed mood 05/12/2007   Allergic rhinitis 05/03/2007   GERD (gastroesophageal reflux disease) 05/03/2007    Past Surgical History:  Procedure Laterality Date   COLONOSCOPY  08/07/2011   WNL, rpt 10 yrs   HERNIA REPAIR     x2    UPPER GASTROINTESTINAL ENDOSCOPY  08/07/2011   chronic superficial gastritis, oozing nodule clipped, neg H pylori       Home Medications    Prior to Admission medications   Medication Sig Start Date End Date Taking? Authorizing Provider  albuterol (VENTOLIN HFA) 108 (90 Base) MCG/ACT inhaler Inhale 1-2 puffs into the lungs every 6 (six) hours as needed for wheezing or shortness of breath. 03/27/21  Yes Verda Cumins, MD  calcium carbonate (TUMS - DOSED IN MG ELEMENTAL CALCIUM) 500 MG chewable tablet Chew 1 tablet by mouth as needed.     Yes [provider]  Cholecalciferol (VITAMIN D3 PO) Take 1 capsule by mouth daily.   Yes [provider]  cyanocobalamin (V-R VITAMIN B-12) 500 MCG tablet Take 1 tablet (500 mcg total) by mouth daily. 01/02/17  Yes Ria Bush, MD  esomeprazole (NEXIUM) 20 MG capsule Take 1 capsule (20 mg total) by mouth daily at 12 noon. 12/21/19  Yes Ria Bush, MD  fluticasone [facility]) 50 MCG/ACT nasal spray USE 2 SPRAYS NASALY DAILY 12/14/14  Yes Ria Bush, MD  hydrOXYzine (ATARAX/VISTARIL) 25 MG tablet Take 0.5-1 tablets (12.5-25 mg total) by mouth 2 (two) times daily as needed for anxiety (sedation precautions). 12/21/19  Yes Ria Bush,  MD  ketoconazole (NIZORAL) 2 % cream Apply topically 2 (two) times daily. 12/21/19  Yes Ria Bush, MD  magnesium 30 MG tablet Take 30 mg by mouth daily.   Yes [provider]  multivitamin [facility]) per tablet Take 1 tablet by mouth daily.     Yes [provider]  psyllium (METAMUCIL) 58.6 % powder Take 1 packet by mouth daily.    Yes [provider]  sildenafil (VIAGRA) 50 MG tablet TAKE ONE TABLET BY MOUTH DAILY AS NEEDED FOR ERECTILE DYSFUNCTION 10/30/20  Yes Ria Bush, MD    Family History Family History  Problem Relation Age of Onset   Depression Father    Prostate cancer Father 40       slow growing   Hypertension Mother    CAD Neg Hx     Stroke Neg Hx    Diabetes Neg Hx     Social History Social History   Tobacco Use   Smoking status: Former   Smokeless tobacco: Never  Substance Use Topics   Alcohol use: Yes    Comment: 1 beer/day   Drug use: No     Allergies   Amoxicillin-pot clavulanate   Review of Systems Review of Systems  Constitutional:  Negative for activity change, appetite change, chills, diaphoresis, fatigue and fever.  HENT:  Positive for postnasal drip. Negative for congestion, ear discharge, ear pain, rhinorrhea, sinus pressure, sinus pain, sneezing and sore throat.   Eyes:  Negative for pain.  Respiratory:  Positive for cough and chest tightness. Negative for shortness of breath and wheezing.   Cardiovascular:  Negative for chest pain and palpitations.  Gastrointestinal:  Negative for abdominal pain, diarrhea, nausea and vomiting.  Genitourinary:  Negative for dysuria.  Musculoskeletal:  Negative for back pain, myalgias and neck pain.  Skin:  Negative for color change, pallor, rash and wound.  Neurological:  Negative for dizziness, syncope, light-headedness, numbness and headaches.  All other systems reviewed and are negative.   Physical Exam Triage Vital Signs ED Triage Vitals  Enc Vitals Group     BP 03/27/21 1303 (!) 142/76     Pulse Rate 03/27/21 1303 67     Resp 03/27/21 1303 18     Temp 03/27/21 1303 98.7 F (37.1 C)     Temp Source 03/27/21 1303 Oral     SpO2 03/27/21 1303 100 %     Weight 03/27/21 1301 185 lb (83.9 kg)     Height 03/27/21 1301 6' 0.5" (1.842 m)     Head Circumference --      Peak Flow --      Pain Score 03/27/21 1301 0     Pain Loc --      Pain Edu? --      Excl. in [place]? --    No data found.  Updated Vital Signs BP (!) 142/76 (BP Location: Right Arm)   Pulse 67   Temp 98.7 F (37.1 C) (Oral)   Resp 18   Ht 6' 0.5" (1.842 m)   Wt 83.9 kg   SpO2 100%   BMI 24.75 kg/m   Visual Acuity Right Eye Distance:   Left Eye Distance:   Bilateral  Distance:    Right Eye Near:   Left Eye Near:    Bilateral Near:     Physical Exam Vitals and nursing note reviewed.  Constitutional:      General: He is not in acute distress.    Appearance: Normal appearance. He is not  ill-appearing, toxic-appearing or diaphoretic.  HENT:     Head: Normocephalic and atraumatic.     Nose: Nose normal.     Mouth/Throat:     Mouth: Mucous membranes are moist.  Eyes:     General: No scleral icterus.       Right eye: No discharge.        Left eye: No discharge.     Conjunctiva/sclera: Conjunctivae normal.     Pupils: Pupils are equal, round, and reactive to light.  Cardiovascular:     Rate and Rhythm: Normal rate and regular rhythm.     Pulses: Normal pulses.     Heart sounds: Normal heart sounds. No murmur heard.   No friction rub. No gallop.  Pulmonary:     Effort: Pulmonary effort is normal.     Breath sounds: Normal breath sounds. No stridor. No wheezing, rhonchi or rales.  Musculoskeletal:     Cervical back: Normal range of motion and neck supple. No rigidity.  Skin:    General: Skin is warm and dry.     Capillary Refill: Capillary refill takes less than 2 seconds.  Neurological:     General: No focal deficit present.     Mental Status: He is alert and oriented to person, place, and time.     UC Treatments / Results  Labs (all labs ordered are listed, but only abnormal results are displayed) Labs Reviewed - No data to display  EKG   Radiology No results found.  Procedures Procedures (including critical care time)  Medications Ordered in UC Medications - No data to display  Initial Impression / Assessment and Plan / UC Course  I have reviewed the triage vital signs and the nursing notes.  Pertinent labs & imaging results that were available during my care of the patient were reviewed by me and considered in my medical decision making (see chart for details).  Clinical impression: 1.  Viral URI 2.  Cough 3.  Recent  COVID-19 with post symptoms 4.  Clinically has atelectasis but is saturating in the 100% and has no wheeze. 5.  Sensation of chest tightness 6.  Postnasal drip  Treatment plan: 1.  The findings and treatment plan were discussed in detail with the patient.  Patient was in agreement. 2.  Had a long discussion with him regarding his current symptoms and vitals as well as exam.  It was all reassuring.  I felt that his symptoms were coming from post COVID.  I believe he has some atelectasis in his lower bronchioles.  I recommended him taking an albuterol inhaler.  one was sent to his pharmacy. 3.  Also he can continue with the Mucinex without the DM component. 4.  I did not want to give him a cough medicine as I do not want to suppress his cough and I want him to cough that stuff up. 5.  I did wanted to start him on Flonase but he said he is already on it. 6.  If symptoms persist he should see his PCP. 7.  Symptoms worsen he should go to the ER. 8.  Educational handouts were provided. 9.  Over-the-counter meds such as Tylenol or Motrin for any fever or discomfort. 10.  He was discharged in stable condition and will follow-up here as needed.    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 Your symptoms seem to be coming from post COVID illness.  It is good that you are not having any fevers but the chest tightness I believe is from increased stickiness deep down in your bronchioles.  It is great that you are 100% on room air.  I believe this is contributing to your cough.  This is also called atelectasis.  I have prescribed a inhaler.  I encourage you to continue with the Mucinex to help thin  the secretions without the DM component.  You are using Flonase which is great.  We did discuss a cough medicine but I do not want to suppress your cough as I want you to cough the stuff up. If your symptoms persist you should see your PCP. Certainly if your symptoms worsen then you should go to the ER. I provided educational handouts. Plenty of rest, plenty fluids, Tylenol or Motrin for any discomfort.     ED Prescriptions     Medication Sig Dispense Auth. Provider   albuterol (VENTOLIN HFA) 108 (90 Base) MCG/ACT inhaler Inhale 1-2 puffs into the lungs every 6 (six) hours as needed for wheezing or shortness of breath. 1 each Verda Cumins, MD      PDMP not reviewed this encounter.   Verda Cumins, MD 03/27/21 [phone]

## 2021-03-27 NOTE — Telephone Encounter (Signed)
Plz triage pt.  °

## 2021-03-27 NOTE — Discharge Instructions (Addendum)
As we discussed, your physical exam findings including your chest exam and your vital signs are very reassuring. Your symptoms seem to be coming from post COVID illness.  It is good that you are not having any fevers but the chest tightness I believe is from increased stickiness deep down in your bronchioles.  It is great that you are 100% on room air.  I believe this is contributing to your cough.  This is also called atelectasis.  I have prescribed a inhaler.  I encourage you to continue with the Mucinex to help thin the secretions without the DM component.  You are using Flonase which is great.  We did discuss a cough medicine but I do not want to suppress your cough as I want you to cough the stuff up. If your symptoms persist you should see your PCP. Certainly if your symptoms worsen then you should go to the ER. I provided educational handouts. Plenty of rest, plenty fluids, Tylenol or Motrin for any discomfort.

## 2021-03-27 NOTE — Telephone Encounter (Signed)
Noted! Thank you

## 2021-03-27 NOTE — ED Triage Notes (Signed)
Patient states that he was positive for covid 2 weeks ago. Reports that he has continued to have a lingering cough and chest tightness. States that he has been checking his o2 sats at home is 97%. States that he hasn't felt like he is turning the corner just yet.

## 2021-04-11 MED ORDER — PULMICORT FLEXHALER 180 MCG/ACT IN AEPB: 1.0000 | INHALATION_SPRAY | Freq: Two times a day (BID) | RESPIRATORY_TRACT | 0 refills | Status: DC

## 2021-04-11 NOTE — Addendum Note (Signed)
Addended by: Ria Bush on: 04/11/2021 12:32 PM   Modules accepted: Orders

## 2021-04-11 NOTE — Telephone Encounter (Signed)
[facility] note reviewed.  Will trial ICS.  Rec OV in person if not better after this.

## 2021-04-22 ENCOUNTER — Telehealth: Payer: Self-pay | Admitting: *Deleted

## 2021-04-22 NOTE — Telephone Encounter (Signed)
Spoke to patient by telephone and was advised that he was told by another nurse to go to the ER. Patient stated that he had called the office to schedule a post covid visit with Dr. Danise Mina. Patient stated that he has had some chest discomfort and feels that it is from the ongoing cough that he has had. Patient stated that he is not having what you would call chest/heart pain and the pain level would below a level one.  Patient denies SOB or difficulty breathing. Patient stated that he has chest congestion and a low grade fever off and on. Patient stated that his pulse oxygen level is 98% and heart rate 70. Patient stated that he has been using Pulmicort. Patient stated that he does not feel that he needs to go to the ER and can wait until his appointment with Dr. Danise Mina. Patient was given ER precautions and he verbalized understanding.

## 2021-04-22 NOTE — Telephone Encounter (Signed)
PLEASE NOTE: All timestamps contained within this report are represented as [place] Standard Time. CONFIDENTIALTY NOTICE: This fax transmission is intended only for the addressee. It contains information that is legally privileged, confidential or otherwise protected from use or disclosure. If you are not the intended recipient, you are strictly prohibited from reviewing, disclosing, copying using or disseminating any of this information or taking any action in reliance on or regarding this information. If you have received this fax in error, please notify us immediately by telephone so that we can arrange for its return to [place]. Phone: [phone], Toll-Free: [phone], Fax: [phone] Page: 1 of 2 Call Id: [id number] [facility] - Client TELEPHONE ADVICE RECORD [facility] Patient Name: Benjamin Robles Gender: Male DOB: 16-Jul-1956 Age: 65 Y 9 M 30 D Return Phone Number: [phone] (Primary), [phone] (Secondary) Address: City/ State/ Zip: [place]  [address] Client [facility] - Client Client Site [place] - Day Physician Ria Bush - MD Contact Type Call Who Is Calling Patient / Member / Family / Caregiver Call Type Triage / Clinical Relationship To Patient Self Return Phone Number [phone] (Primary) Chief Complaint CHEST PAIN - pain, pressure, heaviness or tightness Reason for Call Symptomatic / Request for [place] states he has a lingering cough from covid. It has been going on over a month. The doctor stated in E-mail he would like to see him if it continued, but the office stated they do not have appointment availability. He has congestion. He has not had low blood oxygen but has had chest pain. Additional Comment Pt also wants to schedule for a physical. Translation No Nurse Assessment Nurse: Windle Guard, RN, Lesa Date/Time (Eastern Time):  04/22/2021 12:59:49 PM Confirm and document reason for call. If symptomatic, describe symptoms. ---Caller states he has a productive cough that has been lingering since he had Covid on 03/11/21. He is using Albuterol and Pulmicort Does the patient have any new or worsening symptoms? ---Yes Will a triage be completed? ---Yes Related visit to physician within the last 2 weeks? ---No Does the PT have any chronic conditions? (i.e. diabetes, asthma, this includes High risk factors for pregnancy, etc.) ---No Is this a behavioral health or substance abuse call? ---No Guidelines Guideline Title Affirmed Question Affirmed Notes Nurse Date/Time (Eastern Time) Asthma Attack Chest pain Conner, RN, Lesa 04/22/2021 1:03:43 PM Disp. Time Eilene Ghazi Time) Disposition Final User 04/22/2021 12:57:10 PM Send to Urgent [name], Aldona [place] PLEASE NOTE: All timestamps contained within this report are represented as [place] Standard Time. CONFIDENTIALTY NOTICE: This fax transmission is intended only for the addressee. It contains information that is legally privileged, confidential or otherwise protected from use or disclosure. If you are not the intended recipient, you are strictly prohibited from reviewing, disclosing, copying using or disseminating any of this information or taking any action in reliance on or regarding this information. If you have received this fax in error, please notify us immediately by telephone so that we can arrange for its return to [place]. Phone: [phone], Toll-Free: [phone], Fax: [phone] Page: 2 of 2 Call Id: ID:[id number] 04/22/2021 1:09:56 PM Go to ED Now Yes Windle Guard, RN, Lesa Caller Disagree/Comply Disagree Caller Understands Yes PreDisposition Did not know what to do Care Advice Given Per Guideline GO TO ED NOW: * You need to be seen in the Emergency Department. * Go to the ED at ___________ [place] now. Drive carefully. ANOTHER ADULT SHOULD DRIVE: *  It is  better and safer if another adult drives instead of you. BRING MEDICINES: * It is also a good idea to bring the pill bottles too. This will help the doctor to make certain you are taking the right medicines and the right dose. CARE ADVICE given per Asthma Attack (Adult) guideline. Comments User: Starleen Blue, RN Date/Time Eilene Ghazi Time): 04/22/2021 1:10:32 PM Warm transferred caller to the office for an appt Referrals [place] REFUSED

## 2021-04-22 NOTE — Telephone Encounter (Signed)
Agree this can wait until he sees me

## 2021-04-24 ENCOUNTER — Other Ambulatory Visit: Payer: Self-pay

## 2021-04-24 ENCOUNTER — Ambulatory Visit (INDEPENDENT_AMBULATORY_CARE_PROVIDER_SITE_OTHER): Payer: Managed Care, Other (non HMO) | Admitting: Family Medicine

## 2021-04-24 ENCOUNTER — Encounter: Payer: Self-pay | Admitting: Family Medicine

## 2021-04-24 VITALS — BP 136/80 | HR 70 | Temp 97.5°F | Ht 72.5 in | Wt 178.2 lb

## 2021-04-24 DIAGNOSIS — U099 Post covid-19 condition, unspecified: Secondary | ICD-10-CM | POA: Diagnosis not present

## 2021-04-24 DIAGNOSIS — K219 Gastro-esophageal reflux disease without esophagitis: Secondary | ICD-10-CM | POA: Diagnosis not present

## 2021-04-24 LAB — CBC WITH DIFFERENTIAL/PLATELET
Basophils Absolute: 0 10*3/uL (ref 0.0–0.1)
Basophils Relative: 0.2 % (ref 0.0–3.0)
Eosinophils Absolute: 0.1 10*3/uL (ref 0.0–0.7)
Eosinophils Relative: 0.5 % (ref 0.0–5.0)
HCT: 42.6 % (ref 39.0–52.0)
Hemoglobin: 14.2 g/dL (ref 13.0–17.0)
Lymphocytes Relative: 54.1 % — ABNORMAL HIGH (ref 12.0–46.0)
Lymphs Abs: 8.2 10*3/uL — ABNORMAL HIGH (ref 0.7–4.0)
MCHC: 33.3 g/dL (ref 30.0–36.0)
MCV: 89.7 fl (ref 78.0–100.0)
Monocytes Absolute: 0.6 10*3/uL (ref 0.1–1.0)
Monocytes Relative: 3.9 % (ref 3.0–12.0)
Neutro Abs: 6.2 10*3/uL (ref 1.4–7.7)
Neutrophils Relative %: 41.3 % — ABNORMAL LOW (ref 43.0–77.0)
Platelets: 288 10*3/uL (ref 150.0–400.0)
RBC: 4.75 Mil/uL (ref 4.22–5.81)
RDW: 13.1 % (ref 11.5–15.5)
WBC: 15.1 10*3/uL — ABNORMAL HIGH (ref 4.0–10.5)

## 2021-04-24 LAB — COMPREHENSIVE METABOLIC PANEL
ALT: 14 U/L (ref 0–53)
AST: 17 U/L (ref 0–37)
Albumin: 4.6 g/dL (ref 3.5–5.2)
Alkaline Phosphatase: 62 U/L (ref 39–117)
BUN: 17 mg/dL (ref 6–23)
CO2: 31 mEq/L (ref 19–32)
Calcium: 10.1 mg/dL (ref 8.4–10.5)
Chloride: 101 mEq/L (ref 96–112)
Creatinine, Ser: 1.15 mg/dL (ref 0.40–1.50)
GFR: 67.1 mL/min (ref >60.00)
Glucose, Bld: 93 mg/dL (ref 70–99)
Potassium: 4.4 mEq/L (ref 3.5–5.1)
Sodium: 138 mEq/L (ref 135–145)
Total Bilirubin: 0.5 mg/dL (ref 0.2–1.2)
Total Protein: 7.3 g/dL (ref 6.0–8.3)

## 2021-04-24 NOTE — Progress Notes (Signed)
Patient ID: Benjamin Robles, male    DOB: November 22, 1955, 65 y.o.   MRN: [id number]  This visit was conducted in person.  BP 136/80   Pulse 70   Temp (!) 97.5 F (36.4 C) (Temporal)   Ht 6' 0.5" (1.842 m)   Wt 178 lb 4 oz (80.9 kg)   SpO2 99%   BMI 23.84 kg/m    CC: cough post-COVID Subjective:   HPI: Benjamin Robles is a 65 y.o. male presenting on 04/24/2021 for Cough (C/o persistent cough and chest congestion post COVID.  )   COVID infection 03/11/2021. Seen at [facility]. Presented with ST, nasal congestion, cough. Did not receive antiviral treatment.  Symptoms largely resolved but with persistent chest tightness and cough - so seen again at [facility], treated with albuterol inhaler and guaifenesin and flonase (takes this year round) without significant improvement/change. We sent in pulmicort inhaler to use BID for 1 month - again hasn't noted significant change.   No fevers/chills, wheezing, dyspnea, chest pain, HA, dizziness, lightheadedness.   Notes persistent substernal "lump in middle of chest like congestion" like he needs to cough up mucous. Normally unable to cough any mucous up. Mild tightness to chest, not exertional or improved with rest.   Did receive Moderna vaccine 10/2019, 11/2019.   Known GERD managed with nexium '20mg'$  once daily (previously on 2). Occasionally takes tums as well.      Relevant past medical, surgical, family and social history reviewed and updated as indicated. Interim medical history since our last visit reviewed. Allergies and medications reviewed and updated. Outpatient Medications Prior to Visit  Medication Sig Dispense Refill   albuterol (VENTOLIN HFA) 108 (90 Base) MCG/ACT inhaler Inhale 1-2 puffs into the lungs every 6 (six) hours as needed for wheezing or shortness of breath. 1 each 0   budesonide (PULMICORT FLEXHALER) 180 MCG/ACT inhaler Inhale 1 puff into the lungs in the morning and at bedtime. 1 each 0   calcium carbonate (TUMS - DOSED IN MG ELEMENTAL  CALCIUM) 500 MG chewable tablet Chew 1 tablet by mouth as needed.       Cholecalciferol (VITAMIN D3 PO) Take 1 capsule by mouth daily.     cyanocobalamin (V-R VITAMIN B-12) 500 MCG tablet Take 1 tablet (500 mcg total) by mouth daily.     esomeprazole (NEXIUM) 20 MG capsule Take 1 capsule (20 mg total) by mouth daily at 12 noon.     fluticasone (FLONASE) 50 MCG/ACT nasal spray USE 2 SPRAYS NASALY DAILY 48 g 3   hydrOXYzine (ATARAX/VISTARIL) 25 MG tablet Take 0.5-1 tablets (12.5-25 mg total) by mouth 2 (two) times daily as needed for anxiety (sedation precautions). 30 tablet 1   magnesium 30 MG tablet Take 30 mg by mouth daily.     multivitamin (THERAGRAN) per tablet Take 1 tablet by mouth daily.       psyllium (METAMUCIL) 58.6 % powder Take 1 packet by mouth daily.      sildenafil (VIAGRA) 50 MG tablet TAKE ONE TABLET BY MOUTH DAILY AS NEEDED FOR ERECTILE DYSFUNCTION 10 tablet 3   ketoconazole (NIZORAL) 2 % cream Apply topically 2 (two) times daily. (Patient not taking: Reported on 04/24/2021) 30 g 3   No facility-administered medications prior to visit.     Per HPI unless specifically indicated in ROS section below Review of Systems  Objective:  BP 136/80   Pulse 70   Temp (!) 97.5 F (36.4 C) (Temporal)   Ht 6' 0.5" (1.842  m)   Wt 178 lb 4 oz (80.9 kg)   SpO2 99%   BMI 23.84 kg/m   Wt Readings from Last 3 Encounters:  04/24/21 178 lb 4 oz (80.9 kg)  03/27/21 185 lb (83.9 kg)  12/21/19 177 lb 1 oz (80.3 kg)      Physical Exam Vitals and nursing note reviewed.  Constitutional:      Appearance: Normal appearance. He is not ill-appearing.  HENT:     Right Ear: Tympanic membrane, ear canal and external ear normal. There is no impacted cerumen.     Left Ear: Tympanic membrane, ear canal and external ear normal. There is no impacted cerumen.     Nose: Nose normal. No congestion.     Mouth/Throat:     Mouth: Mucous membranes are moist.     Pharynx: Oropharynx is clear. No  oropharyngeal exudate or posterior oropharyngeal erythema.     Comments: Thin white PNdrainage noted to oropharynx Eyes:     Extraocular Movements: Extraocular movements intact.     Pupils: Pupils are equal, round, and reactive to light.  Neck:     Thyroid: No thyroid mass or thyromegaly.  Cardiovascular:     Rate and Rhythm: Normal rate and regular rhythm.     Pulses: Normal pulses.     Heart sounds: Normal heart sounds. No murmur heard. Pulmonary:     Effort: Pulmonary effort is normal. No respiratory distress.     Breath sounds: Normal breath sounds. No wheezing, rhonchi or rales.  Chest:     Chest wall: No tenderness.  Musculoskeletal:     Cervical back: Normal range of motion and neck supple. No rigidity.     Right lower leg: No edema.     Left lower leg: No edema.  Lymphadenopathy:     Cervical: No cervical adenopathy.  Skin:    General: Skin is warm and dry.     Findings: No rash.  Neurological:     Mental Status: He is alert.  Psychiatric:        Mood and Affect: Mood normal.        Behavior: Behavior normal.      Results for orders placed or performed during the hospital encounter of 03/11/21  Novel Coronavirus, NAA ([facility])   Specimen: Nasopharyngeal Swab; Nasopharyngeal(NP) swabs in vial transport medium   Nasopharynge  Result Value Ref Range   SARS-CoV-2, NAA Detected (A) Not Detected  SARS-COV-2, NAA 2 DAY TAT   Nasopharynge  Result Value Ref Range   SARS-CoV-2, NAA 2 DAY TAT Performed     Assessment & Plan:  This visit occurred during the SARS-CoV-2 public health emergency.  Safety protocols were in place, including screening questions prior to the visit, additional usage of staff PPE, and extensive cleaning of exam room while observing appropriate contact time as indicated for disinfecting solutions.   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 No improvement with symptoms to date. No true dyspnea, wheezing, significant cough. Discussed CXR - will defer for now.  Symptoms not consistent with cardiac cause.  ?small PE post-COVID - check D dimer, discussed need for CTA if elevated.  In h/o GERD, ?acute flare of reflux after COVID - suggested he increase nexium to '40mg'$  daily for 1-2 wks and monitor effect on symptoms.  He's  been overusing albuterol without benefit - advised stop this. Will finish pulmicort inhaler course. Discussed option of possible prednisone taper.       Relevant Orders   Comprehensive metabolic panel   CBC with Differential/Platelet   D-dimer, quantitative     No orders of the defined types were placed in this encounter.  Orders Placed This Encounter  Procedures   Comprehensive metabolic panel   CBC with Differential/Platelet   D-dimer, quantitative    Patient Instructions  Likely from residual COVID.  Labs today.  Double up on nexium to '40mg'$  daily for 1-2 weeks to see effect on symptoms.  Continue pulmicort inhaler for the full month.  If no better, we could consider prednisone taper.  Keep me updated with how you're doing.   Follow up plan: No follow-ups on file.  Ria Bush, MD

## 2021-04-24 NOTE — Patient Instructions (Addendum)
Likely from residual COVID.  Labs today.  Double up on nexium to '40mg'$  daily for 1-2 weeks to see effect on symptoms.  Continue pulmicort inhaler for the full month.  If no better, we could consider prednisone taper.  Keep me updated with how you're doing.

## 2021-04-24 NOTE — Assessment & Plan Note (Addendum)
Persistent sensation of mild substernal chest tightness/globus sensation with reassuring exam and labs. No improvement with symptoms to date. No true dyspnea, wheezing, significant cough. Discussed CXR - will defer for now.  Symptoms not consistent with cardiac cause.  ?small PE post-COVID - check D dimer, discussed need for CTA if elevated.  In h/o GERD, ?acute flare of reflux after COVID - suggested he increase nexium to '40mg'$  daily for 1-2 wks and monitor effect on symptoms.  He's been overusing albuterol without benefit - advised stop this. Will finish pulmicort inhaler course. Discussed option of possible prednisone taper.

## 2021-04-25 ENCOUNTER — Other Ambulatory Visit: Payer: Self-pay | Admitting: Family Medicine

## 2021-04-25 DIAGNOSIS — U099 Post covid-19 condition, unspecified: Secondary | ICD-10-CM

## 2021-04-25 LAB — D-DIMER, QUANTITATIVE: D-Dimer, Quant: <0.19 mcg/mL FEU (ref <0.50)

## 2021-06-03 ENCOUNTER — Other Ambulatory Visit: Payer: Self-pay | Admitting: Family Medicine

## 2021-06-03 NOTE — Telephone Encounter (Signed)
Refill request sildenafil Last refill 10/30/20 #10/3 Last office visit 04/24/21

## 2021-06-25 ENCOUNTER — Other Ambulatory Visit: Payer: Self-pay | Admitting: Family Medicine

## 2021-07-08 ENCOUNTER — Encounter: Payer: Self-pay | Admitting: Internal Medicine

## 2021-07-12 ENCOUNTER — Other Ambulatory Visit: Payer: Managed Care, Other (non HMO)

## 2021-07-19 ENCOUNTER — Encounter: Payer: Managed Care, Other (non HMO) | Admitting: Family Medicine

## 2021-09-09 ENCOUNTER — Other Ambulatory Visit: Payer: Managed Care, Other (non HMO)

## 2021-09-16 ENCOUNTER — Encounter: Payer: Managed Care, Other (non HMO) | Admitting: Family Medicine

## 2021-11-12 ENCOUNTER — Encounter: Payer: Self-pay | Admitting: Family Medicine

## 2021-11-14 ENCOUNTER — Other Ambulatory Visit: Payer: Self-pay

## 2021-11-14 ENCOUNTER — Other Ambulatory Visit: Payer: Self-pay | Admitting: Family Medicine

## 2021-11-14 ENCOUNTER — Ambulatory Visit (INDEPENDENT_AMBULATORY_CARE_PROVIDER_SITE_OTHER): Payer: Managed Care, Other (non HMO) | Admitting: Nurse Practitioner

## 2021-11-14 VITALS — BP 140/78 | HR 69 | Temp 98.0°F | Resp 12 | Ht 72.5 in | Wt 182.4 lb

## 2021-11-14 DIAGNOSIS — E538 Deficiency of other specified B group vitamins: Secondary | ICD-10-CM

## 2021-11-14 DIAGNOSIS — L0291 Cutaneous abscess, unspecified: Secondary | ICD-10-CM | POA: Diagnosis not present

## 2021-11-14 DIAGNOSIS — L989 Disorder of the skin and subcutaneous tissue, unspecified: Secondary | ICD-10-CM | POA: Insufficient documentation

## 2021-11-14 DIAGNOSIS — N401 Enlarged prostate with lower urinary tract symptoms: Secondary | ICD-10-CM

## 2021-11-14 DIAGNOSIS — E7800 Pure hypercholesterolemia, unspecified: Secondary | ICD-10-CM

## 2021-11-14 MED ORDER — SULFAMETHOXAZOLE-TRIMETHOPRIM 800-160 MG PO TABS: 1.0000 | ORAL_TABLET | Freq: Two times a day (BID) | ORAL | 0 refills | Status: DC

## 2021-11-14 NOTE — Patient Instructions (Signed)
Nice to see you today ?Follow up with Dr. Danise Mina as scheduled.  ?Follow up if the place does not start improving  ?

## 2021-11-14 NOTE — Assessment & Plan Note (Signed)
Patient has skin lesion to right upper shoulder that is asymptomatic.  Possible blackhead in nature with open center. ?

## 2021-11-14 NOTE — Progress Notes (Signed)
? ?Acute Office Visit ? ?Subjective:  ? ? Patient ID: Benjamin Robles, male    DOB: 1955/09/05, 66 y.o.   MRN: [id number] ? ?Chief Complaint  ?Patient presents with  ? Cyst  ?  In the mid lower back. Noticed this last week.   ? ? ?HPI ?Patient is in today for cyst ? ?States that he noticed that both of them popped up last week. States that Friday he was doing core exercises in the "cyst" on the lower back and made it difficult to perform his exercises.. States that it is painful to the touch. States that he has had a knot there previously along with 1 on the right upper shoulder.  She states that on the right upper shoulder is not currently bothering him.  States he did try to get into Dr. Sarina Ser dermatologist in [place] but cannot be seen for several months. ? ? ? ? ?Past Medical History:  ?Diagnosis Date  ? Allergy   ? SINUS ALLEGIES  ? Anticoagulant disorder ([place])   ? Anxiety   ? BPH (benign prostatic hypertrophy)   ? mild  ? Bradycardia   ? Depression   ? GERD (gastroesophageal reflux disease)   ? History of colon polyps   ? normal colonoscopy 2012  ? Pain of right sternoclavicular joint   ? 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 Umbilical hernia   ? ? ?Past Surgical History:  ?Procedure Laterality Date  ? COLONOSCOPY  08/07/2011  ? WNL, rpt 10 yrs  ? HERNIA REPAIR    ? x2  ? UPPER GASTROINTESTINAL ENDOSCOPY  08/07/2011  ? chronic superficial gastritis, oozing nodule clipped, neg H pylori  ? ? ?Family History  ?Problem Relation Age of Onset  ? Depression Father   ? Prostate cancer Father 77  ?     slow growing  ? Hypertension Mother   ? CAD Neg Hx   ? Stroke Neg Hx   ? Diabetes Neg Hx   ? ? ?Social History  ? ?Socioeconomic History  ? Marital status: Married  ?  Spouse name: Not on file  ? Number of children: 2  ? Years of education: Not on file  ? Highest education level: Not on file  ?Occupational History  ? Occupation: Tech support  ?  Employer: [employer]  ?Tobacco Use  ? Smoking status: Former   ? Smokeless tobacco: Never  ?Substance and Sexual Activity  ? Alcohol use: Yes  ?  Comment: 1 beer/day  ? Drug use: No  ? Sexual activity: Not on file  ?Other Topics Concern  ? Not on file  ?Social History Narrative  ? "Will"  ? Lives with wife, 2 cats and 2 dogs, grown children  ? Occupation: Air cabin crew support  ? Activity: no regular exercise routine  ? Diet: good water, fruits/vegetables daily  ? ?Social Determinants of Health  ? ?Financial Resource Strain: Not on file  ?Food Insecurity: Not on file  ?Transportation Needs: Not on file  ?Physical Activity: Not on file  ?Stress: Not on file  ?Social Connections: Not on file  ?Intimate Partner Violence: Not on file  ? ? ?Outpatient Medications Prior to Visit  ?Medication Sig Dispense Refill  ? calcium carbonate (TUMS - DOSED IN MG ELEMENTAL CALCIUM) 500 MG chewable tablet Chew 1 tablet by mouth as needed.      ? Cholecalciferol (VITAMIN D3 PO) Take 1 capsule by mouth daily.    ? cyanocobalamin (V-R VITAMIN B-12) 500 MCG  tablet Take 1 tablet (500 mcg total) by mouth daily.    ? esomeprazole (NEXIUM) 20 MG capsule Take 1 capsule (20 mg total) by mouth daily at 12 noon.    ? fluticasone (FLONASE) 50 MCG/ACT nasal spray USE 2 SPRAYS NASALY DAILY 48 g 3  ? hydrOXYzine (ATARAX/VISTARIL) 25 MG tablet TAKE 1/2-1 TABLET BY MOUTH TWO TIMES A DAY AS NEEDED FOR ANXIETY (SEDATION PRECAUTIONS) 30 tablet 1  ? ketoconazole (NIZORAL) 2 % cream Apply topically 2 (two) times daily. 30 g 3  ? magnesium 30 MG tablet Take 30 mg by mouth daily.    ? multivitamin (THERAGRAN) per tablet Take 1 tablet by mouth daily.      ? psyllium (METAMUCIL) 58.6 % powder Take 1 packet by mouth daily.     ? sildenafil (VIAGRA) 50 MG tablet TAKE ONE TABLET BY MOUTH DAILY AS NEEDED FOR ERECTILE DYSFUNCTION 10 tablet 3  ? albuterol (VENTOLIN HFA) 108 (90 Base) MCG/ACT inhaler Inhale 1-2 puffs into the lungs every 6 (six) hours as needed for wheezing or shortness of breath. 1 each 0  ? budesonide  (PULMICORT FLEXHALER) 180 MCG/ACT inhaler Inhale 1 puff into the lungs in the morning and at bedtime. 1 each 0  ? ?No facility-administered medications prior to visit.  ? ? ?Allergies  ?Allergen Reactions  ? Amoxicillin-Pot Clavulanate   ?  REACTION: achey, itch  ? ? ?Review of Systems  ?Constitutional:  Negative for chills and fever.  ?Gastrointestinal:  Negative for diarrhea, nausea and vomiting.  ?Skin:  Positive for rash and wound.  ? ?   ?Objective:  ?  ?Physical Exam ?Constitutional:   ?   Appearance: Normal appearance.  ?Cardiovascular:  ?   Rate and Rhythm: Normal rate and regular rhythm.  ?   Heart sounds: Normal heart sounds.  ?Pulmonary:  ?   Effort: Pulmonary effort is normal.  ?   Breath sounds: Normal breath sounds.  ?Skin: ?   Findings: Erythema and lesion present.  ? ?    ?   Comments:  ?3cm x 2.5cm  ?Neurological:  ?   Mental Status: He is alert.  ? ? ? ? ?BP 140/78   Pulse 69   Temp 98 ?F (36.7 ?C)   Resp 12   Ht 6' 0.5" (1.842 m)   Wt 182 lb 6 oz (82.7 kg)   SpO2 100%   BMI 24.39 kg/m?  ?Wt Readings from Last 3 Encounters:  ?11/14/21 182 lb 6 oz (82.7 kg)  ?04/24/21 178 lb 4 oz (80.9 kg)  ?03/27/21 185 lb (83.9 kg)  ? ? ?Health Maintenance Due  ?Topic Date Due  ? HIV Screening  Never done  ? COVID-19 Vaccine (3 - Booster for Moderna series) 02/01/2020  ? Pneumonia Vaccine 33+ Years old (1 - PCV) Never done  ? COLONOSCOPY (Pts 45-30yr Insurance coverage will need to be confirmed)  08/06/2021  ? ? ?There are no preventive care reminders to display for this patient. ? ? ?Lab Results  ?Component Value Date  ? TSH 0.66 12/30/2016  ? ?Lab Results  ?Component Value Date  ? WBC 15.1 (H) 04/24/2021  ? HGB 14.2 04/24/2021  ? HCT 42.6 04/24/2021  ? MCV 89.7 04/24/2021  ? PLT 288.0 04/24/2021  ? ?Lab Results  ?Component Value Date  ? NA 138 04/24/2021  ? K 4.4 04/24/2021  ? CO2 31 04/24/2021  ? GLUCOSE 93 04/24/2021  ? BUN 17 04/24/2021  ? CREATININE 1.15 04/24/2021  ? BILITOT 0.5  04/24/2021  ?  ALKPHOS 62 04/24/2021  ? AST 17 04/24/2021  ? ALT 14 04/24/2021  ? PROT 7.3 04/24/2021  ? ALBUMIN 4.6 04/24/2021  ? CALCIUM 10.1 04/24/2021  ? GFR 67.10 04/24/2021  ? ?Lab Results  ?Component Value Date  ? CHOL 201 (H) 12/16/2019  ? ?Lab Results  ?Component Value Date  ? HDL 44.30 12/16/2019  ? ?Lab Results  ?Component Value Date  ? LDLCALC 132 (H) 12/16/2019  ? ?Lab Results  ?Component Value Date  ? TRIG 124.0 12/16/2019  ? ?Lab Results  ?Component Value Date  ? CHOLHDL 5 12/16/2019  ? ?No results found for: HGBA1C ? ?   ?Assessment & Plan:  ? ?Problem List Items Addressed This Visit   ? ?  ? Musculoskeletal and Integument  ? Skin lesion  ?  Patient has skin lesion to right upper shoulder that is asymptomatic.  Possible blackhead in nature with open center. ?  ?  ?  ? Other  ? Abscess - Primary  ?  Superficial skin abscess to lower central spine area.  Nonfluctuant in nature did discuss with patient that I&D would not be advisable at this time.  We will start patient on Bactrim DS 1 tablet twice daily for 7 days.  He will follow-up if no improvement did review return to clinic precautions along with urgent or emergency care precautions. ?  ?  ? Relevant Medications  ? sulfamethoxazole-trimethoprim (BACTRIM DS) 800-160 MG tablet  ? ? ? ?No orders of the defined types were placed in this encounter. ? ?This visit occurred during the SARS-CoV-2 public health emergency.  Safety protocols were in place, including screening questions prior to the visit, additional usage of staff PPE, and extensive cleaning of exam room while observing appropriate contact time as indicated for disinfecting solutions.  ? ? ?Romilda Garret, NP ? ?

## 2021-11-14 NOTE — Assessment & Plan Note (Signed)
Superficial skin abscess to lower central spine area.  Nonfluctuant in nature did discuss with patient that I&D would not be advisable at this time.  We will start patient on Bactrim DS 1 tablet twice daily for 7 days.  He will follow-up if no improvement did review return to clinic precautions along with urgent or emergency care precautions. ?

## 2021-11-17 ENCOUNTER — Other Ambulatory Visit: Payer: Self-pay | Admitting: Family Medicine

## 2021-11-17 DIAGNOSIS — E538 Deficiency of other specified B group vitamins: Secondary | ICD-10-CM

## 2021-11-17 DIAGNOSIS — N401 Enlarged prostate with lower urinary tract symptoms: Secondary | ICD-10-CM

## 2021-11-17 DIAGNOSIS — E7800 Pure hypercholesterolemia, unspecified: Secondary | ICD-10-CM

## 2021-11-19 ENCOUNTER — Encounter: Payer: Self-pay | Admitting: Internal Medicine

## 2021-11-19 ENCOUNTER — Other Ambulatory Visit: Payer: Self-pay

## 2021-11-19 ENCOUNTER — Ambulatory Visit (INDEPENDENT_AMBULATORY_CARE_PROVIDER_SITE_OTHER): Payer: Managed Care, Other (non HMO) | Admitting: Family

## 2021-11-19 ENCOUNTER — Other Ambulatory Visit (INDEPENDENT_AMBULATORY_CARE_PROVIDER_SITE_OTHER): Payer: Managed Care, Other (non HMO)

## 2021-11-19 ENCOUNTER — Telehealth: Payer: Self-pay | Admitting: Internal Medicine

## 2021-11-19 VITALS — BP 138/70 | HR 72 | Temp 97.9°F | Resp 16 | Ht 72.0 in | Wt 181.2 lb

## 2021-11-19 DIAGNOSIS — N401 Enlarged prostate with lower urinary tract symptoms: Secondary | ICD-10-CM

## 2021-11-19 DIAGNOSIS — E538 Deficiency of other specified B group vitamins: Secondary | ICD-10-CM

## 2021-11-19 DIAGNOSIS — E7800 Pure hypercholesterolemia, unspecified: Secondary | ICD-10-CM | POA: Diagnosis not present

## 2021-11-19 DIAGNOSIS — R350 Frequency of micturition: Secondary | ICD-10-CM | POA: Diagnosis not present

## 2021-11-19 DIAGNOSIS — L02212 Cutaneous abscess of back [any part, except buttock]: Secondary | ICD-10-CM | POA: Diagnosis not present

## 2021-11-19 LAB — COMPREHENSIVE METABOLIC PANEL
ALT: 13 U/L (ref 0–53)
AST: 16 U/L (ref 0–37)
Albumin: 4.8 g/dL (ref 3.5–5.2)
Alkaline Phosphatase: 71 U/L (ref 39–117)
BUN: 12 mg/dL (ref 6–23)
CO2: 28 mEq/L (ref 19–32)
Calcium: 9.9 mg/dL (ref 8.4–10.5)
Chloride: 101 mEq/L (ref 96–112)
Creatinine, Ser: 1.47 mg/dL (ref 0.40–1.50)
GFR: 49.78 mL/min — ABNORMAL LOW (ref >60.00)
Glucose, Bld: 103 mg/dL — ABNORMAL HIGH (ref 70–99)
Potassium: 4.7 mEq/L (ref 3.5–5.1)
Sodium: 136 mEq/L (ref 135–145)
Total Bilirubin: 0.8 mg/dL (ref 0.2–1.2)
Total Protein: 7 g/dL (ref 6.0–8.3)

## 2021-11-19 LAB — CBC WITH DIFFERENTIAL/PLATELET
Basophils Absolute: 0 10*3/uL (ref 0.0–0.1)
Basophils Relative: 0.2 % (ref 0.0–3.0)
Eosinophils Absolute: 0.1 10*3/uL (ref 0.0–0.7)
Eosinophils Relative: 0.5 % (ref 0.0–5.0)
HCT: 42.5 % (ref 39.0–52.0)
Hemoglobin: 14.4 g/dL (ref 13.0–17.0)
Lymphocytes Relative: 58.8 % — ABNORMAL HIGH (ref 12.0–46.0)
Lymphs Abs: 8.5 10*3/uL — ABNORMAL HIGH (ref 0.7–4.0)
MCHC: 34 g/dL (ref 30.0–36.0)
MCV: 89.3 fl (ref 78.0–100.0)
Monocytes Absolute: 0.6 10*3/uL (ref 0.1–1.0)
Monocytes Relative: 3.9 % (ref 3.0–12.0)
Neutro Abs: 5.3 10*3/uL (ref 1.4–7.7)
Neutrophils Relative %: 36.6 % — ABNORMAL LOW (ref 43.0–77.0)
Platelets: 305 10*3/uL (ref 150.0–400.0)
RBC: 4.76 Mil/uL (ref 4.22–5.81)
RDW: 13.2 % (ref 11.5–15.5)
WBC: 14.5 10*3/uL — ABNORMAL HIGH (ref 4.0–10.5)

## 2021-11-19 LAB — LIPID PANEL
Cholesterol: 203 mg/dL — ABNORMAL HIGH (ref 0–200)
HDL: 47.4 mg/dL (ref >39.00)
LDL Cholesterol: 130 mg/dL — ABNORMAL HIGH (ref 0–99)
NonHDL: 155.57
Total CHOL/HDL Ratio: 4
Triglycerides: 130 mg/dL (ref 0.0–149.0)
VLDL: 26 mg/dL (ref 0.0–40.0)

## 2021-11-19 LAB — VITAMIN B12: Vitamin B-12: 475 pg/mL (ref 211–911)

## 2021-11-19 LAB — PSA: PSA: 1.46 ng/mL (ref 0.10–4.00)

## 2021-11-19 MED ORDER — DOXYCYCLINE HYCLATE 100 MG PO TABS
100.0000 mg | ORAL_TABLET | Freq: Two times a day (BID) | ORAL | 0 refills | Status: AC
Start: 2021-11-19 — End: 2021-11-29

## 2021-11-19 NOTE — Progress Notes (Signed)
? ?Established Patient Office Visit ? ?Subjective:  ?Patient ID: Benjamin Robles, male    DOB: 12/06/1955  Age: 66 y.o. MRN: [id number] ? ?CC:  ?Chief Complaint  ?Patient presents with  ? Cyst  ?  On back X 2 weeks  ? ? ?HPI ?Benjamin Robles is here today with concerns.  ? ?Noticed a abscess on his back, started antibiotics, but noticed last night noticed it started to swell up more and puffier. Redder than originally and starting with discharge.  ? ?No fever/no chills.  ?was given bactrim.  ? ?Past Medical History:  ?Diagnosis Date  ? Allergy   ? SINUS ALLEGIES  ? 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 Anxiety   ? BPH (benign prostatic hypertrophy)   ? mild  ? Bradycardia   ? Depression   ? GERD (gastroesophageal reflux disease)   ? History of colon polyps   ? normal colonoscopy 2012  ? Pain of right sternoclavicular joint   ? 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 Umbilical hernia   ? ? ?Past Surgical History:  ?Procedure Laterality Date  ? COLONOSCOPY  08/07/2011  ? WNL, rpt 10 yrs  ? HERNIA REPAIR    ? x2  ? UPPER GASTROINTESTINAL ENDOSCOPY  08/07/2011  ? chronic superficial gastritis, oozing nodule clipped, neg H pylori  ? ? ?Family History  ?Problem Relation Age of Onset  ? Depression Father   ? Prostate cancer Father 64  ?     slow growing  ? Hypertension Mother   ? CAD Neg Hx   ? Stroke Neg Hx   ? Diabetes Neg Hx   ? ? ?Social History  ? ?Socioeconomic History  ? Marital status: Married  ?  Spouse name: Not on file  ? Number of children: 2  ? Years of education: Not on file  ? Highest education level: Not on file  ?Occupational History  ? Occupation: Tech support  ?  Employer: [employer]  ?Tobacco Use  ? Smoking status: Former  ? Smokeless tobacco: Never  ?Substance and Sexual Activity  ? Alcohol use: Yes  ?  Comment: 1 beer/day  ? Drug use: No  ? Sexual activity: Not on file  ?Other Topics Concern  ? Not on file  ?Social History Narrative  ? "Benjamin Robles"  ? Lives with wife, 2 cats and 2 dogs, grown children  ?  Occupation: Air cabin crew support  ? Activity: no regular exercise routine  ? Diet: good water, fruits/vegetables daily  ? ?Social Determinants of Health  ? ?Financial Resource Strain: Not on file  ?Food Insecurity: Not on file  ?Transportation Needs: Not on file  ?Physical Activity: Not on file  ?Stress: Not on file  ?Social Connections: Not on file  ?Intimate Partner Violence: Not on file  ? ? ?Outpatient Medications Prior to Visit  ?Medication Sig Dispense Refill  ? calcium carbonate (TUMS - DOSED IN MG ELEMENTAL CALCIUM) 500 MG chewable tablet Chew 1 tablet by mouth as needed.      ? Cholecalciferol (VITAMIN D3 PO) Take 1 capsule by mouth daily.    ? cyanocobalamin (V-R VITAMIN B-12) 500 MCG tablet Take 1 tablet (500 mcg total) by mouth daily.    ? esomeprazole (NEXIUM) 20 MG capsule Take 1 capsule (20 mg total) by mouth daily at 12 noon.    ? fluticasone (FLONASE) 50 MCG/ACT nasal spray USE 2 SPRAYS NASALY DAILY 48 g 3  ? hydrOXYzine (ATARAX/VISTARIL) 25 MG tablet TAKE 1/2-1 TABLET BY MOUTH  TWO TIMES A DAY AS NEEDED FOR ANXIETY (SEDATION PRECAUTIONS) 30 tablet 1  ? magnesium 30 MG tablet Take 30 mg by mouth daily.    ? multivitamin (THERAGRAN) per tablet Take 1 tablet by mouth daily.      ? psyllium (METAMUCIL) 58.6 % powder Take 1 packet by mouth daily.     ? sildenafil (VIAGRA) 50 MG tablet TAKE ONE TABLET BY MOUTH DAILY AS NEEDED FOR ERECTILE DYSFUNCTION 10 tablet 3  ? sulfamethoxazole-trimethoprim (BACTRIM DS) 800-160 MG tablet Take 1 tablet by mouth 2 (two) times daily for 7 days. 14 tablet 0  ? ketoconazole (NIZORAL) 2 % cream Apply topically 2 (two) times daily. (Patient not taking: Reported on 11/19/2021) 30 g 3  ? ?No facility-administered medications prior to visit.  ? ? ?Allergies  ?Allergen Reactions  ? Amoxicillin-Pot Clavulanate   ?  REACTION: achey, itch  ? ? ?ROS ?Review of Systems  ?Constitutional:  Negative for chills and fever.  ?Skin:  Positive for wound (raised tender wound on right lower  back with redness surrounding and pus draining).  ? ?  ?Objective:  ?  ?Physical Exam ?Constitutional:   ?   General: He is not in acute distress. ?   Appearance: Normal appearance. He is normal weight. He is not ill-appearing, toxic-appearing or diaphoretic.  ?Pulmonary:  ?   Effort: Pulmonary effort is normal.  ?Skin: ? ?    ?Neurological:  ?   General: No focal deficit present.  ?   Mental Status: He is alert and oriented to person, place, and time.  ? ? ? ? ? ?BP 138/70   Pulse 72   Temp 97.9 ?F (36.6 ?C)   Resp 16   Ht 6' (1.829 m)   Wt 181 lb 3 oz (82.2 kg)   SpO2 99%   BMI 24.57 kg/m?  ?Wt Readings from Last 3 Encounters:  ?11/19/21 181 lb 3 oz (82.2 kg)  ?11/14/21 182 lb 6 oz (82.7 kg)  ?04/24/21 178 lb 4 oz (80.9 kg)  ? ? ? ?Health Maintenance Due  ?Topic Date Due  ? HIV Screening  Never done  ? Pneumonia Vaccine 1+ Years old (1 - PCV) Never done  ? COLONOSCOPY (Pts 45-19yr Insurance coverage Benjamin Robles need to be confirmed)  08/06/2021  ? ? ?There are no preventive care reminders to display for this patient. ? ?Lab Results  ?Component Value Date  ? TSH 0.66 12/30/2016  ? ?Lab Results  ?Component Value Date  ? WBC 15.1 (H) 04/24/2021  ? HGB 14.2 04/24/2021  ? HCT 42.6 04/24/2021  ? MCV 89.7 04/24/2021  ? PLT 288.0 04/24/2021  ? ?Lab Results  ?Component Value Date  ? NA 138 04/24/2021  ? K 4.4 04/24/2021  ? CO2 31 04/24/2021  ? GLUCOSE 93 04/24/2021  ? BUN 17 04/24/2021  ? CREATININE 1.15 04/24/2021  ? BILITOT 0.5 04/24/2021  ? ALKPHOS 62 04/24/2021  ? AST 17 04/24/2021  ? ALT 14 04/24/2021  ? PROT 7.3 04/24/2021  ? ALBUMIN 4.6 04/24/2021  ? CALCIUM 10.1 04/24/2021  ? GFR 67.10 04/24/2021  ? ?No results found for: HGBA1C ? ?  ?Assessment & Plan:  ? ?Problem List Items Addressed This Visit   ? ?  ? Other  ? Abscess of back - Primary  ?  Urgent referral placed for wound care  ?Likely ID needed.  ?Reviewed notes and image from prior visit in regards to abscess ?Stop bactrim, start doxycycline 100 mg ?Monitor  for worsening  s/s infection  ?Wound culture swab ordered pending results  ?  ?  ? Relevant Medications  ? doxycycline (VIBRA-TABS) 100 MG tablet  ? Other Relevant Orders  ? AMB referral to wound care center  ? WOUND CULTURE  ? ? ?Meds ordered this encounter  ?Medications  ? doxycycline (VIBRA-TABS) 100 MG tablet  ?  Sig: Take 1 tablet (100 mg total) by mouth 2 (two) times daily for 10 days.  ?  Dispense:  20 tablet  ?  Refill:  0  ?  Order Specific Question:   Supervising Provider  ?  Answer:   BEDSOLE, AMY E [[id number]]  ? ? ?Follow-up: Return if symptoms worsen or fail to improve with primary care doctor.  ? ? ?Eugenia Pancoast, FNP ?

## 2021-11-19 NOTE — Telephone Encounter (Signed)
Per pre-visit patients recall is just for a repeat colonoscopy. He is currently scheduled for colon\EGD spoke with patient to advise. He will call back to let the office know what he wants to do. If he wants to proceed with having an EGD he needs an OV to discuss with provider. ?

## 2021-11-19 NOTE — Patient Instructions (Signed)
A referral was placed today for wound care. ?Please let us know if you have not heard back within 1 week about your referral. ? ?Stop bactrim, start doxycycline as directed. ? ?It was a pleasure seeing you today! Please do not hesitate to reach out with any questions and or concerns. ? ?Regards,  ? ?Nicolet Griffy ?FNP-C ? ?

## 2021-11-19 NOTE — Assessment & Plan Note (Signed)
Urgent referral placed for wound care  ?Likely ID needed.  ?Reviewed notes and image from prior visit in regards to abscess ?Stop bactrim, start doxycycline 100 mg ?Monitor for worsening s/s infection  ?Wound culture swab ordered pending results  ?

## 2021-11-22 ENCOUNTER — Telehealth: Payer: Self-pay

## 2021-11-22 NOTE — Progress Notes (Signed)
Pt said that wound is improvement. He said it was not as swollen.  ?

## 2021-11-22 NOTE — Telephone Encounter (Signed)
Called pt and informed of results. He stated that the place on back was not as swollen but is still red. Has an appt on 4/12 with wound care center. ?

## 2021-11-22 NOTE — Progress Notes (Signed)
Wound culture was unrevealing. ?Continue with current antibiotic, how are you doing? Any improvement?

## 2021-11-23 LAB — WOUND CULTURE
MICRO NUMBER:: 13189886
RESULT:: NO GROWTH
SPECIMEN QUALITY:: ADEQUATE

## 2021-11-25 NOTE — Telephone Encounter (Signed)
Patient returned call and stated that he wanted to do both. Procedure and PV was canceled and patient was scheduled for OV on 4/28 at 9:00. ?

## 2021-11-29 ENCOUNTER — Encounter: Payer: Managed Care, Other (non HMO) | Admitting: Family Medicine

## 2021-12-04 ENCOUNTER — Other Ambulatory Visit
Admission: RE | Admit: 2021-12-04 | Discharge: 2021-12-04 | Disposition: A | Discharge status: Home / Self Care | Payer: Managed Care, Other (non HMO) | Source: Ambulatory Visit | Attending: Internal Medicine | Admitting: Internal Medicine

## 2021-12-04 ENCOUNTER — Encounter: Payer: Managed Care, Other (non HMO) | Attending: Internal Medicine | Admitting: Internal Medicine

## 2021-12-04 ENCOUNTER — Encounter: Payer: Self-pay | Admitting: Family Medicine

## 2021-12-04 ENCOUNTER — Ambulatory Visit (INDEPENDENT_AMBULATORY_CARE_PROVIDER_SITE_OTHER): Payer: Managed Care, Other (non HMO) | Admitting: Family Medicine

## 2021-12-04 VITALS — BP 124/70 | HR 65 | Temp 97.7°F | Ht 72.5 in | Wt 179.4 lb

## 2021-12-04 DIAGNOSIS — Z0001 Encounter for general adult medical examination with abnormal findings: Secondary | ICD-10-CM | POA: Diagnosis not present

## 2021-12-04 DIAGNOSIS — L02212 Cutaneous abscess of back [any part, except buttock]: Secondary | ICD-10-CM

## 2021-12-04 DIAGNOSIS — D72820 Lymphocytosis (symptomatic): Secondary | ICD-10-CM | POA: Diagnosis not present

## 2021-12-04 DIAGNOSIS — E7800 Pure hypercholesterolemia, unspecified: Secondary | ICD-10-CM | POA: Insufficient documentation

## 2021-12-04 DIAGNOSIS — N400 Enlarged prostate without lower urinary tract symptoms: Secondary | ICD-10-CM | POA: Insufficient documentation

## 2021-12-04 DIAGNOSIS — F4323 Adjustment disorder with mixed anxiety and depressed mood: Secondary | ICD-10-CM

## 2021-12-04 DIAGNOSIS — N401 Enlarged prostate with lower urinary tract symptoms: Secondary | ICD-10-CM

## 2021-12-04 DIAGNOSIS — Z8616 Personal history of COVID-19: Secondary | ICD-10-CM | POA: Insufficient documentation

## 2021-12-04 DIAGNOSIS — L98498 Non-pressure chronic ulcer of skin of other sites with other specified severity: Secondary | ICD-10-CM | POA: Diagnosis not present

## 2021-12-04 DIAGNOSIS — Z87891 Personal history of nicotine dependence: Secondary | ICD-10-CM | POA: Insufficient documentation

## 2021-12-04 DIAGNOSIS — B999 Unspecified infectious disease: Secondary | ICD-10-CM | POA: Insufficient documentation

## 2021-12-04 DIAGNOSIS — Z23 Encounter for immunization: Secondary | ICD-10-CM

## 2021-12-04 DIAGNOSIS — E538 Deficiency of other specified B group vitamins: Secondary | ICD-10-CM

## 2021-12-04 DIAGNOSIS — R350 Frequency of micturition: Secondary | ICD-10-CM

## 2021-12-04 DIAGNOSIS — N289 Disorder of kidney and ureter, unspecified: Secondary | ICD-10-CM

## 2021-12-04 DIAGNOSIS — K219 Gastro-esophageal reflux disease without esophagitis: Secondary | ICD-10-CM | POA: Insufficient documentation

## 2021-12-04 LAB — BASIC METABOLIC PANEL
BUN: 14 mg/dL (ref 6–23)
CO2: 29 mEq/L (ref 19–32)
Calcium: 9.3 mg/dL (ref 8.4–10.5)
Chloride: 103 mEq/L (ref 96–112)
Creatinine, Ser: 1.16 mg/dL (ref 0.40–1.50)
GFR: 66.12 mL/min (ref >60.00)
Glucose, Bld: 126 mg/dL — ABNORMAL HIGH (ref 70–99)
Potassium: 4.5 mEq/L (ref 3.5–5.1)
Sodium: 139 mEq/L (ref 135–145)

## 2021-12-04 NOTE — Assessment & Plan Note (Signed)
Preventative protocols reviewed and updated unless pt declined. Discussed healthy diet and lifestyle.  

## 2021-12-04 NOTE — Patient Instructions (Addendum)
[id number] today (pneumonia shot).  ?Repeat labs today (blood counts).  ?Return in 1 year for physical or welcome to medicare visit  ?Good to see you today. ? ?Health Maintenance After Age 66 ?After age 49, you are at a higher risk for certain long-term diseases and infections as well as injuries from falls. Falls are a major cause of broken bones and head injuries in people who are older than age 22. Getting regular preventive care can help to keep you healthy and well. Preventive care includes getting regular testing and making lifestyle changes as recommended by your health care provider. Talk with your health care provider about: ?Which screenings and tests you should have. A screening is a test that checks for a disease when you have no symptoms. ?A diet and exercise plan that is right for you. ?What should I know about screenings and tests to prevent falls? ?Screening and testing are the best ways to find a health problem early. Early diagnosis and treatment give you the best chance of managing medical conditions that are common after age 32. Certain conditions and lifestyle choices may make you more likely to have a fall. Your health care provider may recommend: ?Regular vision checks. Poor vision and conditions such as cataracts can make you more likely to have a fall. If you wear glasses, make sure to get your prescription updated if your vision changes. ?Medicine review. Work with your health care provider to regularly review all of the medicines you are taking, including over-the-counter medicines. Ask your health care provider about any side effects that may make you more likely to have a fall. Tell your health care provider if any medicines that you take make you feel dizzy or sleepy. ?Strength and balance checks. Your health care provider may recommend certain tests to check your strength and balance while standing, walking, or changing positions. ?Foot health exam. Foot pain and numbness, as well as  not wearing proper footwear, can make you more likely to have a fall. ?Screenings, including: ?Osteoporosis screening. Osteoporosis is a condition that causes the bones to get weaker and break more easily. ?Blood pressure screening. Blood pressure changes and medicines to control blood pressure can make you feel dizzy. ?Depression screening. You may be more likely to have a fall if you have a fear of falling, feel depressed, or feel unable to do activities that you used to do. ?Alcohol use screening. Using too much alcohol can affect your balance and may make you more likely to have a fall. ?Follow these instructions at home: ?Lifestyle ?Do not drink alcohol if: ?Your health care provider tells you not to drink. ?If you drink alcohol: ?Limit how much you have to: ?0-1 drink a day for women. ?0-2 drinks a day for men. ?Know how much alcohol is in your drink. In the [place] one drink equals one 12 oz bottle of beer (355 mL), one 5 oz glass of wine (148 mL), or one 1? oz glass of hard liquor (44 mL). ?Do not use any products that contain nicotine or tobacco. These products include cigarettes, chewing tobacco, and vaping devices, such as e-cigarettes. If you need help quitting, ask your health care provider. ?Activity ? ?Follow a regular exercise program to stay fit. This will help you maintain your balance. Ask your health care provider what types of exercise are appropriate for you. ?If you need a cane or walker, use it as recommended by your health care provider. ?Wear supportive shoes that have nonskid soles. ?Safety ? ?  Remove any tripping hazards, such as rugs, cords, and clutter. ?Install safety equipment such as grab bars in bathrooms and safety rails on stairs. ?Keep rooms and walkways well-lit. ?General instructions ?Talk with your health care provider about your risks for falling. Tell your health care provider if: ?You fall. Be sure to tell your health care provider about all falls, even ones that seem  minor. ?You feel dizzy, tiredness (fatigue), or off-balance. ?Take over-the-counter and prescription medicines only as told by your health care provider. These include supplements. ?Eat a healthy diet and maintain a healthy weight. A healthy diet includes low-fat dairy products, low-fat (lean) meats, and fiber from whole grains, beans, and lots of fruits and vegetables. ?Stay current with your vaccines. ?Schedule regular health, dental, and eye exams. ?Summary ?Having a healthy lifestyle and getting preventive care can help to protect your health and wellness after age 76. ?Screening and testing are the best way to find a health problem early and help you avoid having a fall. Early diagnosis and treatment give you the best chance for managing medical conditions that are more common for people who are older than age 54. ?Falls are a major cause of broken bones and head injuries in people who are older than age 67. Take precautions to prevent a fall at home. ?Work with your health care provider to learn what changes you can make to improve your health and wellness and to prevent falls. ?This information is not intended to replace advice given to you by your health care provider. Make sure you discuss any questions you have with your health care provider. ?Document Revised: 12/31/2020 Document Reviewed: 12/31/2020 ?Elsevier Patient Education ? [place]. ? ?

## 2021-12-04 NOTE — Assessment & Plan Note (Signed)
Overall stable period off medication 

## 2021-12-04 NOTE — Assessment & Plan Note (Signed)
Stable period on nexium OTC '20mg'$  daily. Upcoming GI appt  ?

## 2021-12-04 NOTE — Progress Notes (Signed)
? ? Patient ID: Benjamin Robles, male    DOB: Sep 03, 1955, 66 y.o.   MRN: [id number] ? ?This visit was conducted in person. ? ?BP 124/70 (BP Location: Right Arm, Cuff Size: Normal)   Pulse 65   Temp 97.7 ?F (36.5 ?C) (Temporal)   Ht 6' 0.5" (1.842 m)   Wt 179 lb 6 oz (81.4 kg)   SpO2 98%   BMI 23.99 kg/m?   ?BP Readings from Last 3 Encounters:  ?12/04/21 124/70  ?11/19/21 138/70  ?11/14/21 140/78  ? ?CC: CPE ?Subjective:  ? ?HPI: ?Benjamin Robles is a 66 y.o. male presenting on 12/04/2021 for Annual Exam ? ? ?Recently saw our office for abscess of back s/p treatment with bactrim then changed to doxycycline. Due to worsening symptoms, referred to wound care - seen today, s/p drainage. 2nd culture sent off.  ? ?Continues nexium '20mg'$  daily with benefit.  ? ?BP elevated today - not on antihypertensives.  ? ?New lymphocytosis - denies fevers/chills, night sweats, fatigue, weight loss.  ? ?Preventative:  ?Colonoscopy 07/2011 - WNL rec rpt 10 yrs [name])  ?UPPER GASTROINTESTINAL ENDOSCOPY Date: 08/07/2011 chronic superficial gastritis, oozing nodule clipped, neg H pylori [name])  ?Upcoming GI appt 11/2021 to discuss rpt colonoscopy/EGD.  ?Prostate cancer screening - father with h/o slow growing prostate cancer. H/o BPH with some frequency and nocturia x1. PSA yearly.  ?Lung cancer screening - not eligible  ?Flu shot - yearly  ?Tetanus shot - 2003, 2010, Tdap 11/2019 ?COVID vaccine - Moderna 10/2019, 11/2019, boosters 06/2020, 02/2021, bivalent Pfizer 07/2021 ?[id number] today ?Shingrix - 11/2019, 05/2020 ?Advanced directive -  ?Seat belt use discussed  ?Sunscreen use discussed - no changing moles on skin, sees derm yearly  ?Sleep - averaging 6-7 hours/night ?Ex smoker - quit remotely (10 PY hx)  ?Alcohol - 1 beer daily  ?Dentist - q6 mo  ?Eye exam yearly  ? 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   ? ?Relevant past medical, surgical, family and social history reviewed and updated as indicated. Interim medical history since our last visit reviewed. ?Allergies and medications reviewed and updated. ?Outpatient Medications Prior to Visit  ?Medication Sig Dispense Refill  ? calcium carbonate (TUMS - DOSED IN MG ELEMENTAL CALCIUM) 500 MG chewable tablet Chew 1 tablet by mouth as needed.      ? Cholecalciferol (VITAMIN D3 PO) Take 1 capsule by mouth daily.    ? cyanocobalamin (V-R VITAMIN B-12) 500 MCG tablet Take 1 tablet (500 mcg total) by mouth daily.    ? esomeprazole (NEXIUM) 20 MG capsule Take 1 capsule (20 mg total) by mouth daily at 12 noon.    ? fluticasone (FLONASE) 50 MCG/ACT nasal spray USE 2 SPRAYS NASALY DAILY 48 g 3  ? hydrOXYzine (ATARAX/VISTARIL) 25 MG tablet TAKE 1/2-1 TABLET BY MOUTH TWO TIMES A DAY AS NEEDED FOR ANXIETY (SEDATION PRECAUTIONS) 30 tablet 1  ? magnesium 30 MG tablet Take 30 mg by mouth daily.    ? multivitamin (THERAGRAN) per tablet Take 1 tablet by mouth daily.      ? psyllium (METAMUCIL) 58.6 % powder Take 1 packet by mouth daily.     ? sildenafil (VIAGRA) 50 MG tablet TAKE ONE TABLET BY MOUTH DAILY AS NEEDED FOR ERECTILE DYSFUNCTION 10 tablet 3  ? ketoconazole (NIZORAL) 2 % cream Apply topically 2 (two) times  daily. 30 g 3  ? ?No facility-administered medications prior to visit.  ?  ? ?Per HPI unless specifically indicated in ROS section below ?Review of Systems  ?Constitutional:  Negative for activity change, appetite change, chills, fatigue, fever and unexpected weight change.  ?HENT:  Negative for hearing loss.   ?Eyes:  Negative for visual disturbance.  ?Respiratory:  Negative for cough, chest tightness, shortness of breath and wheezing.   ?Cardiovascular:  Negative for chest pain, palpitations and leg swelling.  ?Gastrointestinal:  Positive for abdominal pain (discomfort) and diarrhea (occ). Negative for abdominal distention, blood in stool,  constipation, nausea and vomiting.  ?Genitourinary:  Negative for difficulty urinating and hematuria.  ?Musculoskeletal:  Negative for arthralgias, myalgias and neck pain.  ?Skin:  Negative for rash.  ?Neurological:  Negative for dizziness, seizures, syncope and headaches.  ?Hematological:  Negative for adenopathy. Does not bruise/bleed easily.  ?Psychiatric/Behavioral:  Negative for dysphoric mood. The patient is not nervous/anxious.   ? ?Objective:  ?BP 124/70 (BP Location: Right Arm, Cuff Size: Normal)   Pulse 65   Temp 97.7 ?F (36.5 ?C) (Temporal)   Ht 6' 0.5" (1.842 m)   Wt 179 lb 6 oz (81.4 kg)   SpO2 98%   BMI 23.99 kg/m?   ?Wt Readings from Last 3 Encounters:  ?12/04/21 179 lb 6 oz (81.4 kg)  ?11/19/21 181 lb 3 oz (82.2 kg)  ?11/14/21 182 lb 6 oz (82.7 kg)  ?  ?  ?Physical Exam ?Vitals and nursing note reviewed.  ?Constitutional:   ?   General: He is not in acute distress. ?   Appearance: Normal appearance. He is well-developed. He is not ill-appearing.  ?HENT:  ?   Head: Normocephalic and atraumatic.  ?   Right Ear: Hearing, tympanic membrane, ear canal and external ear normal.  ?   Left Ear: Hearing, tympanic membrane, ear canal and external ear normal.  ?Eyes:  ?   General: No scleral icterus. ?   Extraocular Movements: Extraocular movements intact.  ?   Conjunctiva/sclera: Conjunctivae normal.  ?   Pupils: Pupils are equal, round, and reactive to light.  ?Neck:  ?   Thyroid: No thyroid mass or thyromegaly.  ?   Vascular: No carotid bruit.  ?Cardiovascular:  ?   Rate and Rhythm: Normal rate and regular rhythm.  ?   Pulses: Normal pulses.     ?     Radial pulses are 2+ on the right side and 2+ on the left side.  ?   Heart sounds: Normal heart sounds. No murmur heard. ?Pulmonary:  ?   Effort: Pulmonary effort is normal. No respiratory distress.  ?   Breath sounds: Normal breath sounds. No wheezing, rhonchi or rales.  ?Abdominal:  ?   General: Bowel sounds are normal. There is no distension.  ?    Palpations: Abdomen is soft. There is no mass.  ?   Tenderness: There is no abdominal tenderness. There is no guarding or rebound.  ?   Hernia: No hernia is present.  ?Musculoskeletal:     ?   General: Normal range of motion.  ?   Cervical back: Normal range of motion and neck supple.  ?   Right lower leg: No edema.  ?   Left lower leg: No edema.  ?Lymphadenopathy:  ?   Cervical: No cervical adenopathy.  ?Skin: ?   General: Skin is warm and dry.  ?   Findings: No rash.  ?Neurological:  ?   General:  No focal deficit present.  ?   Mental Status: He is alert and oriented to person, place, and time.  ?Psychiatric:     ?   Mood and Affect: Mood normal.     ?   Behavior: Behavior normal.     ?   Thought Content: Thought content normal.     ?   Judgment: Judgment normal.  ? ?   ? ?Lab Results  ?Component Value Date  ? WBC 14.5 (H) 11/19/2021  ? HGB 14.4 11/19/2021  ? HCT 42.5 11/19/2021  ? MCV 89.3 11/19/2021  ? PLT 305.0 11/19/2021  ? ?Lab Results  ?Component Value Date  ? CREATININE 1.47 11/19/2021  ? BUN 12 11/19/2021  ? NA 136 11/19/2021  ? K 4.7 11/19/2021  ? CL 101 11/19/2021  ? CO2 28 11/19/2021  ?  ?Results for orders placed or performed in visit on 11/19/21  ?WOUND CULTURE  ? Specimen: Wound  ?Result Value Ref Range  ? MICRO NUMBER: [id number]   ? SPECIMEN QUALITY: Adequate   ? SOURCE: BACK ABSCESS   ? STATUS: FINAL   ? GRAM STAIN:    ?  No organisms or white blood cells seen No epithelial cells seen  ? RESULT: No Growth   ? ? ?  12/04/2021  ? 12:51 PM 12/21/2019  ?  4:13 PM  ?Depression screen PHQ 2/9  ?Decreased Interest 1 1  ?Down, Depressed, Hopeless 0 0  ?PHQ - 2 Score 1 1  ?Altered sleeping 2 3  ?Tired, decreased energy 1 1  ?Change in appetite 2 1  ?Feeling bad or failure about yourself  0 0  ?Trouble concentrating 0 0  ?Moving slowly or fidgety/restless 0 0  ?Suicidal thoughts 0 0  ?PHQ-9 Score 6 6  ?Difficult doing work/chores Not difficult at all   ?  ? ?  12/04/2021  ? 12:52 PM 12/21/2019  ?  4:13 PM  ?GAD 7  : Generalized Anxiety Score  ?Nervous, Anxious, on Edge 1 2  ?Control/stop worrying 0 2  ?Worry too much - different things 0 2  ?Trouble relaxing 0 1  ?Restless 0 0  ?Easily annoyed or irritable 0 0  ?Afraid - awful might happen 0 0  ?To

## 2021-12-04 NOTE — Assessment & Plan Note (Signed)
Continue b12 55mg daily with benefit. ?

## 2021-12-04 NOTE — Assessment & Plan Note (Signed)
Appreciate wound care, seen this morning s/p treatment. Area is currently bandaged.  ?

## 2021-12-04 NOTE — Assessment & Plan Note (Signed)
Chronic, off medication with stable PSA ?

## 2021-12-04 NOTE — Assessment & Plan Note (Signed)
Isolated Cr elevation while on bactrim, ?med related - update kidney function today. Encouraged good water intake, limiting excessive NSAID use. ?

## 2021-12-04 NOTE — Assessment & Plan Note (Addendum)
Chronic, off medication. Reviewed diet choices to control cholesterol levels. Discussed ASCVD risk and option of statin to further decrease cardiovascular risk.  ?The 10-year ASCVD risk score (Arnett DK, et al., 2019) is: 17.1% ?  Values used to calculate the score: ?    Age: 66 years ?    Sex: Male ?    Is Non-Hispanic African American: No ?    Diabetic: No ?    Tobacco smoker: No ?    Systolic Blood Pressure: 749 mmHg ?    Is BP treated: No ?    HDL Cholesterol: 47.4 mg/dL ?    Total Cholesterol: 203 mg/dL  ?

## 2021-12-04 NOTE — Assessment & Plan Note (Signed)
Noted 03/2021, persistent into last month.  ?Will repeat CBC, add periph smear, LDH, and SPEP in new renal insufficiency.  ?

## 2021-12-06 LAB — AEROBIC CULTURE W GRAM STAIN (SUPERFICIAL SPECIMEN)
Culture: NO GROWTH
Gram Stain: NONE SEEN

## 2021-12-09 LAB — CBC WITH DIFFERENTIAL/PLATELET
Absolute Monocytes: 426 cells/uL (ref 200–950)
Basophils Absolute: 40 cells/uL (ref 0–200)
Basophils Relative: 0.3 %
Eosinophils Absolute: 27 cells/uL (ref 15–500)
Eosinophils Relative: 0.2 %
HCT: 41.2 % (ref 38.5–50.0)
Hemoglobin: 14 g/dL (ref 13.2–17.1)
Lymphs Abs: 7541 cells/uL — ABNORMAL HIGH (ref 850–3900)
MCH: 30.8 pg (ref 27.0–33.0)
MCHC: 34 g/dL (ref 32.0–36.0)
MCV: 90.7 fL (ref 80.0–100.0)
MPV: 9.8 fL (ref 7.5–12.5)
Monocytes Relative: 3.2 %
Neutro Abs: 5267 cells/uL (ref 1500–7800)
Neutrophils Relative %: 39.6 %
Platelets: 301 10*3/uL (ref 140–400)
RBC: 4.54 10*6/uL (ref 4.20–5.80)
RDW: 12.4 % (ref 11.0–15.0)
Total Lymphocyte: 56.7 %
WBC: 13.3 10*3/uL — ABNORMAL HIGH (ref 3.8–10.8)

## 2021-12-09 LAB — PROTEIN ELECTROPHORESIS, SERUM, WITH REFLEX
Albumin ELP: 4.3 g/dL (ref 3.8–4.8)
Alpha 1: 0.3 g/dL (ref 0.2–0.3)
Alpha 2: 0.5 g/dL (ref 0.5–0.9)
Beta 2: 0.3 g/dL (ref 0.2–0.5)
Beta Globulin: 0.4 g/dL (ref 0.4–0.6)
Gamma Globulin: 0.9 g/dL (ref 0.8–1.7)
Total Protein: 6.6 g/dL (ref 6.1–8.1)

## 2021-12-09 LAB — PATHOLOGIST SMEAR REVIEW

## 2021-12-09 LAB — LACTATE DEHYDROGENASE: LDH: 108 U/L — ABNORMAL LOW (ref 120–250)

## 2021-12-10 NOTE — Progress Notes (Signed)
BRODYN, DEPUY ([id number]) ?Visit Report for 12/04/2021 ?Abuse Risk Screen Details ?Patient Name: Benjamin Robles, Benjamin Robles. ?Date of Service: 12/04/2021 8:45 AM ?Medical Record Number: [id number] ?Patient Account Number: [id number] ?Date of Birth/Sex: [birth year] (66 y.o. M) ?Treating RN: Carlene Coria ?Primary Care Willine Schwalbe: Ria Bush Other Clinician: ?Referring Eufemio Strahm: Eugenia Pancoast ?Treating Clennon Nasca/Extender: Ricard Dillon ?Weeks in Treatment: 0 ?Abuse Risk Screen Items ?Answer ?ABUSE RISK SCREEN: ?Has anyone close to you tried to hurt or harm you recentlyo No ?Do you feel uncomfortable with anyone in your familyo No ?Has anyone forced you do things that you didnot want to doo No ?Electronic Signature(s) ?Signed: 12/10/2021 9:16:22 AM By: Carlene Coria RN ?Entered By: Carlene Coria on 12/04/2021 08:50:39 ?LAMARCUS, SPIRA ([id number]) ?-------------------------------------------------------------------------------- ?Activities of Daily Living Details ?Patient Name: Benjamin Robles. ?Date of Service: 12/04/2021 8:45 AM ?Medical Record Number: [id number] ?Patient Account Number: [id number] ?Date of Birth/Sex: 1956-05-26 (66 y.o. M) ?Treating RN: Carlene Coria ?Primary Care Sangita Zani: Ria Bush Other Clinician: ?Referring Jadia Capers: Eugenia Pancoast ?Treating Lakoda Raske/Extender: Ricard Dillon ?Weeks in Treatment: 0 ?Activities of Daily Living Items ?Answer ?Activities of Daily Living (Please select one for each item) ?[place] ?Take Medications Completely Able ?Use Telephone Completely Able ?Care for Appearance Completely Able ?Use Toilet Completely Able ?Bath / Shower Completely Able ?Dress Self Completely Able ?Feed Self Completely Able ?Walk Completely Able ?Get In / Out Bed Completely Able ?Housework Completely Able ?Prepare Meals Completely Able ?Handle Money Completely Able ?Shop for Self Completely Able ?Electronic Signature(s) ?Signed: 12/10/2021 9:16:22 AM By: Carlene Coria RN ?Entered By: Carlene Coria on 12/04/2021 08:51:06 ?[name], [name] ([id number]) ?-------------------------------------------------------------------------------- ?Education Screening Details ?Patient Name: Benjamin Robles. ?Date of Service: 12/04/2021 8:45 AM ?Medical Record Number: [id number] ?Patient Account Number: [id number] ?Date of Birth/Sex: 1956/04/10 (66 y.o. M) ?Treating RN: Carlene Coria ?Primary Care Morgan Keinath: Ria Bush Other Clinician: ?Referring Azaria Stegman: Eugenia Pancoast ?Treating Osric Klopf/Extender: Ricard Dillon ?Weeks in Treatment: 0 ?Primary Learner Assessed: Patient ?Learning Preferences/Education Level/Primary Language ?Learning Preference: Explanation ?Highest Education Level: College or Above ?Preferred Language: English ?Cognitive Barrier ?Language Barrier: No ?Translator Needed: No ?Memory Deficit: No ?Emotional Barrier: No ?Cultural/Religious Beliefs Affecting Medical Care: No ?Physical Barrier ?Impaired Vision: No ?Impaired Hearing: No ?Decreased Hand dexterity: No ?Knowledge/Comprehension ?Knowledge Level: Medium ?Comprehension Level: High ?Ability to understand written instructions: High ?Ability to understand verbal instructions: High ?Motivation ?Anxiety Level: Anxious ?Cooperation: Cooperative ?Education Importance: Acknowledges Need ?Interest in Health Problems: Asks Questions ?Perception: Coherent ?Willingness to Engage in Self-Management ?High ?Activities: ?Readiness to Engage in Self-Management ?High ?Activities: ?Electronic Signature(s) ?Signed: 12/10/2021 9:16:22 AM By: Carlene Coria RN ?Entered By: Carlene Coria on 12/04/2021 08:51:33 ?LAMBROS, CERRO ([id number]) ?-------------------------------------------------------------------------------- ?Fall Risk Assessment Details ?Patient Name: Benjamin Robles. ?Date of Service: 12/04/2021 8:45 AM ?Medical Record Number: [id number] ?Patient Account Number: [id number] ?Date of Birth/Sex: 1956-07-04 (66 y.o. M) ?Treating RN: Carlene Coria ?Primary Care Partick Musselman:  Ria Bush Other Clinician: ?Referring Allyanna Appleman: Eugenia Pancoast ?Treating Kenli Waldo/Extender: Ricard Dillon ?Weeks in Treatment: 0 ?Fall Risk Assessment Items ?Have you had 2 or more falls in the last 12 monthso 0 No ?Have you had any fall that resulted in injury in the last 12 monthso 0 No ?FALLS RISK SCREEN ?History of falling - immediate or within 3 months 0 No ?Secondary diagnosis (Do you have 2 or more medical diagnoseso) 0 No ?Ambulatory aid ?None/bed rest/wheelchair/nurse 0 No ?Crutches/cane/walker 0 No ?Furniture 0 No ?Intravenous therapy Access/Saline/Heparin Lock 0 No ?Gait/Transferring ?Normal/ bed rest/ wheelchair 0 No ?  Weak (short steps with or without shuffle, stooped but able to lift head while walking, may ?0 No ?seek support from furniture) ?Impaired (short steps with shuffle, may have difficulty arising from chair, head down, impaired ?0 No ?balance) ?Mental Status ?Oriented to own ability 0 No ?Electronic Signature(s) ?Signed: 12/10/2021 9:16:22 AM By: Carlene Coria RN ?Entered By: Carlene Coria on 12/04/2021 08:51:39 ?ROBERTH, BERLING ([id number]) ?-------------------------------------------------------------------------------- ?Foot Assessment Details ?Patient Name: Benjamin Robles. ?Date of Service: 12/04/2021 8:45 AM ?Medical Record Number: [id number] ?Patient Account Number: [id number] ?Date of Birth/Sex: [birth year] (66 y.o. M) ?Treating RN: Carlene Coria ?Primary Care Ronny Ruddell: Ria Bush Other Clinician: ?Referring Thereasa Iannello: Eugenia Pancoast ?Treating Amrit Cress/Extender: Ricard Dillon ?Weeks in Treatment: 0 ?Foot Assessment Items ?Site Locations ?+ = Sensation present, - = Sensation absent, C = Callus, U = Ulcer ?R = Redness, W = Warmth, M = Maceration, PU = Pre-ulcerative lesion ?F = Fissure, S = Swelling, D = Dryness ?Assessment ?Right: Left: ?Other Deformity: No No ?Prior Foot Ulcer: No No ?Prior Amputation: No No ?Charcot Joint: No No ?Ambulatory Status: Ambulatory Without  Help ?Gait: Steady ?Electronic Signature(s) ?Signed: 12/10/2021 9:16:22 AM By: Carlene Coria RN ?Entered By: Carlene Coria on 12/04/2021 08:52:07 ?ALFIO, LOESCHER ([id number]) ?-------------------------------------------------------------------------------- ?Nutrition Risk Screening Details ?Patient Name: DYAMI, UMBACH. ?Date of Service: 12/04/2021 8:45 AM ?Medical Record Number: [id number] ?Patient Account Number: [id number] ?Date of Birth/Sex: May 18, 1956 (66 y.o. M) ?Treating RN: Carlene Coria ?Primary Care Cadie Sorci: Ria Bush Other Clinician: ?Referring Jacorian Golaszewski: Eugenia Pancoast ?Treating Taniah Reinecke/Extender: Ricard Dillon ?Weeks in Treatment: 0 ?Height (in): 73 ?Weight (lbs): 180 ?Body Mass Index (BMI): 23.7 ?Nutrition Risk Screening Items ?Score Screening ?NUTRITION RISK SCREEN: ?I have an illness or condition that made me change the kind and/or amount of food I eat 0 No ?I eat fewer than two meals per day 0 No ?I eat few fruits and vegetables, or milk products 0 No ?I have three or more drinks of beer, liquor or wine almost every day 0 No ?I have tooth or mouth problems that make it hard for me to eat 0 No ?I don't always have enough money to buy the food I need 0 No ?I eat alone most of the time 0 No ?I take three or more different prescribed or over-the-counter drugs a day 1 Yes ?Without wanting to, I have lost or gained 10 pounds in the last six months 0 No ?I am not always physically able to shop, cook and/or feed myself 0 No ?Nutrition Protocols ?Good Risk Protocol 0 No interventions needed ?Moderate Risk Protocol ?High Risk Proctocol ?Risk Level: Good Risk ?Score: 1 ?Electronic Signature(s) ?Signed: 12/10/2021 9:16:22 AM By: Carlene Coria RN ?Entered By: Carlene Coria on 12/04/2021 08:51:57 ?

## 2021-12-10 NOTE — Progress Notes (Signed)
Benjamin Robles ([id number]) ?Visit Report for 12/04/2021 ?Chief Complaint Document Details ?Patient Name: Benjamin Robles, Benjamin Robles. ?Date of Service: 12/04/2021 8:45 AM ?Medical Record Number: [id number] ?Patient Account Number: [id number] ?Date of Birth/Sex: [birth year] (66 y.o. M) ?Treating RN: Benjamin Robles ?Primary Care Provider: Ria Robles Other Clinician: ?Referring Provider: Eugenia Robles ?Treating Provider/Extender: Benjamin Robles ?Weeks in Treatment: 0 ?Information Obtained from: Patient ?Chief Complaint ?12/04/2021; patient is here for review of an abscess on his back referred by primary care ?Electronic Signature(s) ?Signed: 12/05/2021 7:47:50 AM By: Benjamin Ham MD ?Entered By: Benjamin Robles on 12/04/2021 09:13:19 ?[name], [name] ([id number]) ?-------------------------------------------------------------------------------- ?HPI Details ?Patient Name: Benjamin Robles, Benjamin Robles. ?Date of Service: 12/04/2021 8:45 AM ?Medical Record Number: [id number] ?Patient Account Number: [id number] ?Date of Birth/Sex: 07-16-1956 (66 y.o. M) ?Treating RN: Benjamin Robles ?Primary Care Provider: Ria Robles Other Clinician: ?Referring Provider: Eugenia Robles ?Treating Provider/Extender: Benjamin Robles ?Weeks in Treatment: 0 ?History of Present Illness ?HPI Description: ADMISSION ?12/04/2021 ?This is a fairly healthy 66 year old man who was referred by primary care after presenting on 11/19/2021 with a cyst/abscess on his back for 2 ?weeks. He was empirically given doxycycline. A culture of the drainage of this was negative done because of fear that this was a cutaneous ?abscess. He is referred here for review. ?He states that he feels he has had a raised area here for a long period of time but it started enlarging 2 weeks before he saw his primary care and ?become became somewhat more painful. He thinks the doxycycline helped and that it became less swollen and less uncomfortable. He has been ?compressing it trying to get it to drain and  using hot compresses. He came into the clinic with almost nothing open although our intake nurse was ?able to identify a small draining area at 0.1 x 0.1 x 0.1 ?Past medical history includes hypercholesterolemia, BPH, COVID, gastroesophageal reflux disease and depression. The patient is active he likes to ?workout but states that he thinks this makes the area on his back worse. He does not describe a recurrent problem with abscesses or cysts in ?other areas ?Electronic Signature(s) ?Signed: 12/05/2021 7:47:50 AM By: Benjamin Ham MD ?Entered By: Benjamin Robles on 12/04/2021 09:18:07 ?Benjamin Robles, Benjamin Robles ([id number]) ?-------------------------------------------------------------------------------- ?Incision and Drainage Details ?Patient Name: Benjamin Robles, Benjamin Robles. ?Date of Service: 12/04/2021 8:45 AM ?Medical Record Number: [id number] ?Patient Account Number: [id number] ?Date of Birth/Sex: 1956-07-22 (66 y.o. M) ?Treating RN: Benjamin Robles ?Primary Care Provider: Ria Robles Other Clinician: ?Referring Provider: Eugenia Robles ?Treating Provider/Extender: Benjamin Robles ?Weeks in Treatment: 0 ?Incision And Drainage Performed Wound #1 Back ?for: ?Performed By: Physician Benjamin Dillon, MD ?Incision And Drainage Type: Abscess ?Location: back ?Level of Consciousness (Pre- Awake and Alert ?procedure): ?Pre-procedure Verification/Time Yes - 09:00 ?Out Taken: ?Pain Control: Lidocaine Injectable ?Drainage Of: Purulent ?Instrument: Blade ?Bleeding: Minimum ?Hemostasis Achieved: Pressure ?Culture Sent: Swab ?Procedural Pain: 0 ?Post Procedural Pain: 0 ?Response to Treatment: Procedure was tolerated well ?Level of Consciousness (Post- Awake and Alert ?procedure): ?Post Procedure Diagnosis ?Same as Pre-procedure ?Electronic Signature(s) ?Signed: 12/05/2021 7:47:50 AM By: Benjamin Ham MD ?Previous Signature: 12/04/2021 9:25:59 AM Version By: Benjamin Coria RN ?Entered By: Benjamin Robles on 12/04/2021 09:28:06 ?Benjamin Robles, Benjamin Robles  ([id number]) ?-------------------------------------------------------------------------------- ?Physical Exam Details ?Patient Name: Benjamin Robles, Benjamin Robles. ?Date of Service: 12/04/2021 8:45 AM ?Medical Record Number: [id number] ?Patient Account Number: [id number] ?Date of Birth/Sex: 11/20/1955 (66 y.o. M) ?Treating RN: Benjamin Robles ?Primary Care Provider: Ria Robles Other Clinician: ?Referring Provider: Eugenia Robles ?Treating Provider/Extender: Benjamin Robles ?Weeks in Treatment: 0 ?Constitutional ?Patient is hypertensive.. Pulse regular and within target range for patient.Marland Kitchen Respirations regular, non-labored and within target range.Marland Kitchen ?Temperature is normal and within the target range for the patient.Marland Kitchen appears in no distress. ?Notes ?Wound exam; the areas on his lower thoracic area. There is some swelling and some erythema but only a small punctate open area that is ?draining some cloudy fluid. The area is not particularly tender. No crepitus ?Electronic Signature(s) ?Signed: 12/05/2021 7:47:50 AM By: Benjamin Ham MD ?Entered By: Benjamin Robles on 12/04/2021 09:21:19 ?Benjamin Robles, Benjamin Robles ([id number]) ?-------------------------------------------------------------------------------- ?Physician Orders Details ?Patient Name: Benjamin Robles, Benjamin Robles. ?Date of Service: 12/04/2021 8:45 AM ?Medical Record Number: [id number] ?Patient Account Number: [id number] ?Date of Birth/Sex: Mar 19, 1956 (66 y.o. M) ?Treating RN: Benjamin Robles ?Primary Care Provider: Ria Robles Other Clinician: ?Referring Provider: Eugenia Robles ?Treating Provider/Extender: Benjamin Robles ?Weeks in Treatment: 0 ?Verbal / Phone Orders: No ?Diagnosis Coding ?ICD-10 Coding ?Code Description ?L02.212 Cutaneous abscess of back [any part, except buttock] ?L98.498 Non-pressure chronic ulcer of skin of other sites with other specified severity ?Follow-up Appointments ?o Return Appointment in 1 week. ?Bathing/ Shower/ Hygiene ?o Wash wounds with antibacterial soap and  water. ?Wound Treatment ?Wound #1 - Back ?Cleanser: Soap and Water 1 x Per Day/30 Days ?Discharge Instructions: Gently cleanse wound with antibacterial soap, rinse and pat dry prior to dressing wounds ?Topical: Triple Antibiotic Ointment, 1 (oz) Tube 1 x Per Day/30 Days ?Discharge Instructions: thin layer ?Secondary Dressing: Coverlet Latex-Free Fabric Adhesive Dressings 1 x Per Day/30 Days ?Discharge Instructions: Knuckle ?Electronic Signature(s) ?Signed: 12/04/2021 9:30:01 AM By: Benjamin Coria RN ?Signed: 12/05/2021 7:47:50 AM By: Benjamin Ham MD ?Entered By: Benjamin Robles on 12/04/2021 09:30:01 ?Benjamin Robles, Benjamin Robles ([id number]) ?-------------------------------------------------------------------------------- ?Problem List Details ?Patient Name: Benjamin Robles, Benjamin Robles. ?Date of Service: 12/04/2021 8:45 AM ?Medical Record Number: [id number] ?Patient Account Number: [id number] ?Date of Birth/Sex: [birth year] (66 y.o. M) ?Treating RN: Benjamin Robles ?Primary Care Provider: Ria Robles Other Clinician: ?Referring Provider: Eugenia Robles ?Treating Provider/Extender: Benjamin Robles ?Weeks in Treatment: 0 ?Active Problems ?ICD-10 ?Encounter ?Code Description Active Date MDM ?Diagnosis ?L02.212 Cutaneous abscess of back [any part, except buttock] 12/04/2021 No Yes ?L98.498 Non-pressure chronic ulcer of skin of other sites with other specified 12/04/2021 No Yes ?severity ?Inactive Problems ?Resolved Problems ?Electronic Signature(s) ?Signed: 12/05/2021 7:47:50 AM By: Benjamin Ham MD ?Entered By: Benjamin Robles on 12/04/2021 09:11:50 ?Benjamin Robles, Benjamin Robles ([id number]) ?-------------------------------------------------------------------------------- ?Progress Note Details ?Patient Name: Benjamin Robles, Benjamin Robles. ?Date of Service: 12/04/2021 8:45 AM ?Medical Record Number: [id number] ?Patient Account Number: [id number] ?Date of Birth/Sex: [birth year] (66 y.o. M) ?Treating RN: Benjamin Robles ?Primary Care Provider: Ria Robles Other Clinician: ?Referring  Provider: Eugenia Robles ?Treating Provider/Extender: Benjamin Robles ?Weeks in Treatment: 0 ?Subjective ?Chief Complaint ?Information obtained from Patient ?12/04/2021; patient is here for review of an abscess on h

## 2021-12-10 NOTE — Progress Notes (Signed)
Benjamin Robles, GALENTINE ([id number]) ?Visit Report for 12/04/2021 ?Allergy List Details ?Patient Name: Benjamin Robles, Benjamin Robles. ?Date of Service: 12/04/2021 8:45 AM ?Medical Record Number: [id number] ?Patient Account Number: [id number] ?Date of Birth/Sex: 07/21/1956 (66 y.o. M) ?Treating RN: Carlene Coria ?Primary Care Adolfo Granieri: Ria Bush Other Clinician: ?Referring Avira Tillison: Eugenia Pancoast ?Treating Janese Radabaugh/Extender: Ricard Dillon ?Weeks in Treatment: 0 ?Allergies ?Active Allergies ?Augmentin ?Allergy Notes ?Electronic Signature(s) ?Signed: 12/10/2021 9:16:22 AM By: Carlene Coria RN ?Entered By: Carlene Coria on 12/04/2021 08:48:45 ?ZYAD, BOOMER ([id number]) ?-------------------------------------------------------------------------------- ?Arrival Information Details ?Patient Name: Benjamin Robles, Benjamin Robles. ?Date of Service: 12/04/2021 8:45 AM ?Medical Record Number: [id number] ?Patient Account Number: [id number] ?Date of Birth/Sex: [birth year] (66 y.o. M) ?Treating RN: Carlene Coria ?Primary Care Dacey Milberger: Ria Bush Other Clinician: ?Referring Eivin Mascio: Eugenia Pancoast ?Treating Tywon Niday/Extender: Ricard Dillon ?Weeks in Treatment: 0 ?Visit Information ?Patient Arrived: Ambulatory ?Arrival Time: 08:46 ?Accompanied By: self ?Transfer Assistance: None ?Patient Identification Verified: Yes ?Secondary Verification Process Completed: Yes ?Patient Requires Transmission-Based Precautions: No ?Patient Has Alerts: No ?Electronic Signature(s) ?Signed: 12/10/2021 9:16:22 AM By: Carlene Coria RN ?Entered By: Carlene Coria on 12/04/2021 08:47:16 ?XAVI, TOMASIK ([id number]) ?-------------------------------------------------------------------------------- ?Clinic Level of Care Assessment Details ?Patient Name: Benjamin Robles, Benjamin Robles. ?Date of Service: 12/04/2021 8:45 AM ?Medical Record Number: [id number] ?Patient Account Number: [id number] ?Date of Birth/Sex: [birth year] (66 y.o. M) ?Treating RN: Carlene Coria ?Primary Care Isahia Hollerbach: Ria Bush Other  Clinician: ?Referring Tolulope Pinkett: Eugenia Pancoast ?Treating Haji Delaine/Extender: Ricard Dillon ?Weeks in Treatment: 0 ?Clinic Level of Care Assessment Items ?TOOL 1 Quantity Score ?X - Use when EandM and Procedure is performed on INITIAL visit 1 0 ?ASSESSMENTS - Nursing Assessment / Reassessment ?X - General Physical Exam (combine w/ comprehensive assessment (listed just below) when performed on new ?1 20 ?pt. evals) ?X- 1 25 ?Comprehensive Assessment (HX, ROS, Risk Assessments, Wounds Hx, etc.) ?ASSESSMENTS - Wound and Skin Assessment / Reassessment ?'[]'$  - Dermatologic / Skin Assessment (not related to wound area) 0 ?ASSESSMENTS - Ostomy and/or Continence Assessment and Care ?'[]'$  - Incontinence Assessment and Management 0 ?'[]'$  - 0 ?Ostomy Care Assessment and Management (repouching, etc.) ?PROCESS - Coordination of Care ?X - Simple Patient / Family Education for ongoing care 1 15 ?'[]'$  - 0 ?Complex (extensive) Patient / Family Education for ongoing care ?X- 1 10 ?Staff obtains Consents, Records, Test Results / Process Orders ?'[]'$  - 0 ?Staff telephones HHA, Nursing Homes / Clarify orders / etc ?'[]'$  - 0 ?Routine Transfer to another Facility (non-emergent condition) ?'[]'$  - 0 ?Routine Hospital Admission (non-emergent condition) ?X- 1 15 ?New Admissions / Biomedical engineer / Ordering NPWT, Apligraf, etc. ?'[]'$  - 0 ?Emergency Hospital Admission (emergent condition) ?PROCESS - Special Needs ?'[]'$  - Pediatric / Minor Patient Management 0 ?'[]'$  - 0 ?Isolation Patient Management ?'[]'$  - 0 ?Hearing / Language / Visual special needs ?'[]'$  - 0 ?Assessment of Community assistance (transportation, D/C planning, etc.) ?'[]'$  - 0 ?Additional assistance / Altered mentation ?'[]'$  - 0 ?Support Surface(s) Assessment (bed, cushion, seat, etc.) ?INTERVENTIONS - Miscellaneous ?'[]'$  - External ear exam 0 ?'[]'$  - 0 ?Patient Transfer (multiple staff / Civil Service fast streamer / Similar devices) ?'[]'$  - 0 ?Simple Staple / Suture removal (25 or less) ?'[]'$  - 0 ?Complex Staple /  Suture removal (26 or more) ?'[]'$  - 0 ?Hypo/Hyperglycemic Management (do not check if billed separately) ?'[]'$  - 0 ?Ankle / Brachial Index (ABI) - do not check if billed separately ?Has the patient been seen at the hospital within the last three years: Yes ?Total Score: 85 ?Level Of  Care: New/Established - Level ?3 ?Benjamin Robles, Benjamin Robles ([id number]) ?Electronic Signature(s) ?Signed: 12/10/2021 9:16:22 AM By: Carlene Coria RN ?Entered By: Carlene Coria on 12/04/2021 09:35:13 ?Benjamin Robles, Benjamin Robles ([id number]) ?-------------------------------------------------------------------------------- ?Encounter Discharge Information Details ?Patient Name: Benjamin Robles, Benjamin Robles. ?Date of Service: 12/04/2021 8:45 AM ?Medical Record Number: [id number] ?Patient Account Number: [id number] ?Date of Birth/Sex: April 04, 1956 (66 y.o. M) ?Treating RN: Carlene Coria ?Primary Care Mazi Schuff: Ria Bush Other Clinician: ?Referring Karmine Kauer: Eugenia Pancoast ?Treating Ajla Mcgeachy/Extender: Ricard Dillon ?Weeks in Treatment: 0 ?Encounter Discharge Information Items Post Procedure Vitals ?Discharge Condition: Stable ?Temperature (?F): 98.1 ?Ambulatory Status: Ambulatory ?Pulse (bpm): 71 ?Discharge Destination: Home ?Respiratory Rate (breaths/min): 18 ?Transportation: Private Auto ?Blood Pressure (mmHg): 167/87 ?Accompanied By: self ?Schedule Follow-up Appointment: Yes ?Clinical Summary of Care: Patient Declined ?Electronic Signature(s) ?Signed: 12/04/2021 9:38:25 AM By: Carlene Coria RN ?Entered ByCarlene Coria on 12/04/2021 09:38:24 ?Benjamin Robles, Benjamin Robles ([id number]) ?-------------------------------------------------------------------------------- ?Lower Extremity Assessment Details ?Patient Name: Benjamin Robles, Benjamin Robles. ?Date of Service: 12/04/2021 8:45 AM ?Medical Record Number: [id number] ?Patient Account Number: [id number] ?Date of Birth/Sex: 1956-01-09 (67 y.o. M) ?Treating RN: Carlene Coria ?Primary Care Jazzmine Kleiman: Ria Bush Other Clinician: ?Referring Aleighna Wojtas: Eugenia Pancoast ?Treating Uri Turnbough/Extender: Ricard Dillon ?Weeks in Treatment: 0 ?Electronic Signature(s) ?Signed: 12/10/2021 9:16:22 AM By: Carlene Coria RN ?Entered By: Carlene Coria on 12/04/2021 08:55:40 ?Benjamin Robles, Benjamin Robles ([id number]) ?-------------------------------------------------------------------------------- ?Multi Wound Chart Details ?Patient Name: Benjamin Robles, Benjamin Robles. ?Date of Service: 12/04/2021 8:45 AM ?Medical Record Number: [id number] ?Patient Account Number: [id number] ?Date of Birth/Sex: [birth year] (67 y.o. M) ?Treating RN: Carlene Coria ?Primary Care Amylynn Fano: Ria Bush Other Clinician: ?Referring Chelsee Hosie: Eugenia Pancoast ?Treating Nillie Bartolotta/Extender: Ricard Dillon ?Weeks in Treatment: 0 ?Vital Signs ?Height(in): 73 ?Pulse(bpm): 71 ?Weight(lbs): 180 ?Blood Pressure(mmHg): 167/87 ?Body Mass Index(BMI): 23.7 ?Temperature(??F): 98.1 ?Respiratory Rate(breaths/min): 18 ?Photos: [N/A:N/A] ?Wound Location: Back N/A N/A ?Wounding Event: Gradually Appeared N/A N/A ?Primary Etiology: Abscess N/A N/A ?Date Acquired: 11/06/2021 N/A N/A ?Weeks of Treatment: 0 N/A N/A ?Wound Status: Open N/A N/A ?Wound Recurrence: No N/A N/A ?Measurements L x W x D (cm) 0.1x0.1x0.1 N/A N/A ?Area (cm?) : 0.008 N/A N/A ?Volume (cm?) : 0.001 N/A N/A ?Classification: Full Thickness Without Exposed N/A N/A ?Support Structures ?Exudate Amount: Medium N/A N/A ?Exudate Type: Purulent N/A N/A ?Exudate Color: yellow, brown, green N/A N/A ?Granulation Amount: None Present (0%) N/A N/A ?Necrotic Amount: None Present (0%) N/A N/A ?Exposed Structures: ?Fascia: No N/A N/A ?Fat Layer (Subcutaneous Tissue): ?No ?Tendon: No ?Muscle: No ?Joint: No ?Bone: No ?Epithelialization: None N/A N/A ?Treatment Notes ?Electronic Signature(s) ?Signed: 12/05/2021 7:47:50 AM By: Linton Ham MD ?Entered By: Linton Ham on 12/04/2021 09:12:31 ?PINCHOS, TOPEL  ([id number]) ?-------------------------------------------------------------------------------- ?Multi-Disciplinary Care Plan Details ?Patient Name: Benjamin Robles, Benjamin Robles. ?Date of Service: 12/04/2021 8:45 AM ?Medical Record Number: [id number] ?Patient Account Number: [id number] ?Date of Birth/Sex: August 10, 1956 (66 y.o. M) ?Treating RN: Carlene Coria ?Pri

## 2021-12-11 ENCOUNTER — Encounter (HOSPITAL_BASED_OUTPATIENT_CLINIC_OR_DEPARTMENT_OTHER): Payer: Managed Care, Other (non HMO) | Admitting: Internal Medicine

## 2021-12-11 ENCOUNTER — Other Ambulatory Visit: Payer: Self-pay | Admitting: Family Medicine

## 2021-12-11 DIAGNOSIS — L02212 Cutaneous abscess of back [any part, except buttock]: Secondary | ICD-10-CM

## 2021-12-11 DIAGNOSIS — L98498 Non-pressure chronic ulcer of skin of other sites with other specified severity: Secondary | ICD-10-CM | POA: Diagnosis not present

## 2021-12-11 NOTE — Progress Notes (Signed)
Benjamin Robles, Benjamin Robles ([id number]) ?Visit Report for 12/11/2021 ?Chief Complaint Document Details ?Patient Name: Benjamin Robles, Benjamin Robles. ?Date of Service: 12/11/2021 11:00 AM ?Medical Record Number: [id number] ?Patient Account Number: [id number] ?Date of Birth/Sex: 10-14-1955 (66 y.o. M) ?Treating RN: Carlene Coria ?Primary Care Provider: Ria Bush Other Clinician: ?Referring Provider: Ria Bush ?Treating Provider/Extender: Kalman Shan ?Weeks in Treatment: 1 ?Information Obtained from: Patient ?Chief Complaint ?12/04/2021; patient is here for review of an abscess on his back referred by primary care ?Electronic Signature(s) ?Signed: 12/11/2021 12:20:53 PM By: Kalman Shan DO ?Entered By: Kalman Shan on 12/11/2021 12:16:20 ?Benjamin Robles, Benjamin Robles ([id number]) ?-------------------------------------------------------------------------------- ?HPI Details ?Patient Name: Benjamin Robles, Benjamin Robles. ?Date of Service: 12/11/2021 11:00 AM ?Medical Record Number: [id number] ?Patient Account Number: [id number] ?Date of Birth/Sex: 06-Nov-1955 (66 y.o. M) ?Treating RN: Carlene Coria ?Primary Care Provider: Ria Bush Other Clinician: ?Referring Provider: Ria Bush ?Treating Provider/Extender: Kalman Shan ?Weeks in Treatment: 1 ?History of Present Illness ?HPI Description: ADMISSION ?12/04/2021 ?This is a fairly healthy 66 year old man who was referred by primary care after presenting on 11/19/2021 with a cyst/abscess on his back for 2 ?weeks. He was empirically given doxycycline. A culture of the drainage of this was negative done because of fear that this was a cutaneous ?abscess. He is referred here for review. ?He states that he feels he has had a raised area here for a long period of time but it started enlarging 2 weeks before he saw his primary care and ?become became somewhat more painful. He thinks the doxycycline helped and that it became less swollen and less uncomfortable. He has been ?compressing it trying to get it to  drain and using hot compresses. He came into the clinic with almost nothing open although our intake nurse was ?able to identify a small draining area at 0.1 x 0.1 x 0.1 ?Past medical history includes hypercholesterolemia, BPH, COVID, gastroesophageal reflux disease and depression. The patient is active he likes to ?workout but states that he thinks this makes the area on his back worse. He does not describe a recurrent problem with abscesses or cysts in ?other areas ?4/19; patient presents for follow-up. He was initially seen in our office 1 week ago for a cyst/abscess on his back that was drained. Wound culture ?done 1 week ago showed no growth. He has been using antibiotic ointment to the area and reports that the area is closed and there has not been ?drainage for the past several days. ?Electronic Signature(s) ?Signed: 12/11/2021 12:20:53 PM By: Kalman Shan DO ?Entered By: Kalman Shan on 12/11/2021 12:18:29 ?Benjamin Robles, Benjamin Robles ([id number]) ?-------------------------------------------------------------------------------- ?Physical Exam Details ?Patient Name: Benjamin Robles, Benjamin Robles. ?Date of Service: 12/11/2021 11:00 AM ?Medical Record Number: [id number] ?Patient Account Number: [id number] ?Date of Birth/Sex: 03-03-1956 (66 y.o. M) ?Treating RN: Carlene Coria ?Primary Care Provider: Ria Bush Other Clinician: ?Referring Provider: Ria Bush ?Treating Provider/Extender: Kalman Shan ?Weeks in Treatment: 1 ?Constitutional ?. ?Psychiatric ?Marland Kitchen ?Notes ?To his lower thoracic area there is no open wounds or drainage noted. No increased warmth or erythema. ?Electronic Signature(s) ?Signed: 12/11/2021 12:20:53 PM By: Kalman Shan DO ?Entered By: Kalman Shan on 12/11/2021 12:18:55 ?Benjamin Robles, Benjamin Robles ([id number]) ?-------------------------------------------------------------------------------- ?Physician Orders Details ?Patient Name: Benjamin Robles, Benjamin Robles. ?Date of Service: 12/11/2021 11:00 AM ?Medical Record Number:  [id number] ?Patient Account Number: [id number] ?Date of Birth/Sex: 06/10/1956 (66 y.o. M) ?Treating RN: Carlene Coria ?Primary Care Provider: Ria Bush Other Clinician: ?Referring Provider: Ria Bush ?Treating Provider/Extender: Kalman Shan ?Weeks in Treatment: 1 ?Verbal / Phone Orders: No ?Diagnosis Coding ?Discharge  From [facility] Services ?o Discharge from [place] Treatment Complete ?Electronic Signature(s) ?Signed: 12/11/2021 12:20:53 PM By: Kalman Shan DO ?Entered By: Kalman Shan on 12/11/2021 12:20:21 ?Benjamin Robles, Benjamin Robles ([id number]) ?-------------------------------------------------------------------------------- ?Problem List Details ?Patient Name: Benjamin Robles, Benjamin Robles. ?Date of Service: 12/11/2021 11:00 AM ?Medical Record Number: [id number] ?Patient Account Number: [id number] ?Date of Birth/Sex: 09-29-1955 (66 y.o. 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 ?Date of Service: 12/11/2021 11:00 AM ?Medical Record Number: [id number] ?Patient Account Number: [id number] ?Date of Birth/Sex: July 09, 1956 (66 y.o. M) ?Treating RN: Carlene Coria ?Primary Care Provider: Ria Bush Other Clinician: ?Referring Provider: Ria Bush ?Treating  Provider/Extender: Kalman Shan ?Weeks in Treatment: 1 ?Subjective ?Chief Complaint ?Information obtained from Patient ?12/04/2021; patient is here for review of an abscess on his back referred by primary care ?History of Present Illness (HPI) ?ADMISSION ?12/04/2021 ?This is a fairly healthy 66 year old man who was referred by primary care after presenting on 11/19/2021 with a cyst/abscess on his back for 2 ?weeks. He was empirically given doxycycline. A culture of the drainage of this was negative done because of fear that this was a cutaneous ?abscess. He is referred here for review. ?He states that he feels he has had a raised area here for a long period of time but it started enlarging 2 weeks before he saw his primary care and ?become became somewhat more painful. He thinks the doxycycline helped and that it became less swollen and less uncomfortable. He has been ?compressing it trying to get it to drain and using hot compresses. He came into the clinic with almost nothing open although our intake nurse was ?able to identify a small draining area at 0.1 x 0.1 x 0.1 ?Past medical history includes hypercholesterolemia, BPH, COVID, gastroesophageal reflux disease and depression. The patient is active he likes to ?workout but states that he thinks this makes the area on his back worse. He does not describe a recurrent problem with abscesses or cysts in ?other areas ?4/19; patient presents for follow-up. He was initially seen in our office 1 week ago for a cyst/abscess on his back that was drained. Wound culture ?done 1 week ago showed no growth. He has been using antibiotic ointment to the area and reports that the area is closed and there has not been ?drainage for the past several days. ?Objective ?Constitutional ?Vitals Time Taken: 11:01 AM, Height: 73 in, Weight: 180 lbs, BMI: 23.7, Temperature: 98.6 ??F, Pulse: 66 bpm, Respiratory Rate: 18 breaths/min, ?Blood Pressure: 179/87 mmHg. ?General Notes: To his  lower thoracic area there is no open wounds or drainage noted. No increased warmth or erythema. ?Integumentary (Hair, Skin) ?Wound #1 status is Open. Original cause of wound was Gradually Appeared. The date acquired was: 11/06/2021

## 2021-12-11 NOTE — Telephone Encounter (Signed)
Refill request Sildenafil ?Last refill 06/06/21 #10/3 ?Last office visit 12/04/21 ?

## 2021-12-13 NOTE — Progress Notes (Signed)
ESIAS, MORY ([id number]) ?Visit Report for 12/11/2021 ?Arrival Information Details ?Patient Name: Benjamin Robles, Benjamin Robles. ?Date of Service: 12/11/2021 11:00 AM ?Medical Record Number: [id number] ?Patient Account Number: [id number] ?Date of Birth/Sex: 12-Jun-1956 (66 y.o. M) ?Treating RN: Carlene Coria ?Primary Care Kord Monette: Ria Bush Other Clinician: ?Referring Irvan Tiedt: Ria Bush ?Treating Marshia Tropea/Extender: Kalman Shan ?Weeks in Treatment: 1 ?Visit Information History Since Last Visit ?All ordered tests and consults were completed: No ?Patient Arrived: Ambulatory ?Added or deleted any medications: No ?Arrival Time: 10:58 ?Any new allergies or adverse reactions: No ?Accompanied By: self ?Had a fall or experienced change in No ?Transfer Assistance: None ?activities of daily living that may affect ?Patient Identification Verified: Yes ?risk of falls: ?Secondary Verification Process Completed: Yes ?Signs or symptoms of abuse/neglect since last visito No ?Patient Requires Transmission-Based Precautions: No ?Hospitalized since last visit: No ?Patient Has Alerts: No ?Implantable device outside of the clinic excluding No ?cellular tissue based products placed in the center ?since last visit: ?Has Dressing in Place as Prescribed: Yes ?Pain Present Now: No ?Electronic Signature(s) ?Signed: 12/13/2021 4:04:50 PM By: Carlene Coria RN ?Entered By: Carlene Coria on 12/11/2021 11:01:20 ?Benjamin Robles, Benjamin Robles ([id number]) ?-------------------------------------------------------------------------------- ?Clinic Level of Care Assessment Details ?Patient Name: Benjamin Robles, Benjamin Robles. ?Date of Service: 12/11/2021 11:00 AM ?Medical Record Number: [id number] ?Patient Account Number: [id number] ?Date of Birth/Sex: 02-13-1956 (66 y.o. M) ?Treating RN: Carlene Coria ?Primary Care Anaika Santillano: Ria Bush Other Clinician: ?Referring Krystofer Benjamin Robles: Ria Bush ?Treating Maddalena Linarez/Extender: Kalman Shan ?Weeks in Treatment: 1 ?Clinic Level of Care  Assessment Items ?TOOL 4 Quantity Score ?X - Use when only an EandM is performed on FOLLOW-UP visit 1 0 ?ASSESSMENTS - Nursing Assessment / Reassessment ?X - Reassessment of Co-morbidities (includes updates in patient status) 1 10 ?X- 1 5 ?Reassessment of Adherence to Treatment Plan ?ASSESSMENTS - Wound and Skin Assessment / Reassessment ?X - Simple Wound Assessment / Reassessment - one wound 1 5 ?'[]'$  - 0 ?Complex Wound Assessment / Reassessment - multiple wounds ?'[]'$  - 0 ?Dermatologic / Skin Assessment (not related to wound area) ?ASSESSMENTS - Focused Assessment ?'[]'$  - Circumferential Edema Measurements - multi extremities 0 ?'[]'$  - 0 ?Nutritional Assessment / Counseling / Intervention ?'[]'$  - 0 ?Lower Extremity Assessment (monofilament, tuning fork, pulses) ?'[]'$  - 0 ?Peripheral Arterial Disease Assessment (using hand held doppler) ?ASSESSMENTS - Ostomy and/or Continence Assessment and Care ?'[]'$  - Incontinence Assessment and Management 0 ?'[]'$  - 0 ?Ostomy Care Assessment and Management (repouching, etc.) ?PROCESS - Coordination of Care ?X - Simple Patient / Family Education for ongoing care 1 15 ?'[]'$  - 0 ?Complex (extensive) Patient / Family Education for ongoing care ?X- 1 10 ?Staff obtains Consents, Records, Test Results / Process Orders ?'[]'$  - 0 ?Staff telephones [place], Nursing Homes / Clarify orders / etc ?'[]'$  - 0 ?Routine Transfer to another Facility (non-emergent condition) ?'[]'$  - 0 ?Routine Hospital Admission (non-emergent condition) ?'[]'$  - 0 ?New Admissions / Biomedical engineer / Ordering NPWT, Apligraf, etc. ?'[]'$  - 0 ?Emergency Hospital Admission (emergent condition) ?X- 1 10 ?Simple Discharge Coordination ?'[]'$  - 0 ?Complex (extensive) Discharge Coordination ?PROCESS - Special Needs ?'[]'$  - Pediatric / Minor Patient Management 0 ?'[]'$  - 0 ?Isolation Patient Management ?'[]'$  - 0 ?Hearing / Language / Visual special needs ?'[]'$  - 0 ?Assessment of Community assistance (transportation, D/C planning, etc.) ?'[]'$  - 0 ?Additional  assistance / Altered mentation ?'[]'$  - 0 ?Support Surface(s) Assessment (bed, cushion, seat, etc.) ?INTERVENTIONS - Wound Cleansing / Measurement ?Benjamin Robles, Benjamin Robles ([id number]) ?X- 1 5 ?Simple Wound Cleansing -  one wound ?'[]'$  - 0 ?Complex Wound Cleansing - multiple wounds ?X- 1 5 ?Wound Imaging (photographs - any number of wounds) ?'[]'$  - 0 ?Wound Tracing (instead of photographs) ?X- 1 5 ?Simple Wound Measurement - one wound ?'[]'$  - 0 ?Complex Wound Measurement - multiple wounds ?INTERVENTIONS - Wound Dressings ?'[]'$  - Small Wound Dressing one or multiple wounds 0 ?'[]'$  - 0 ?Medium Wound Dressing one or multiple wounds ?'[]'$  - 0 ?Large Wound Dressing one or multiple wounds ?'[]'$  - 0 ?Application of Medications - topical ?'[]'$  - 0 ?Application of Medications - injection ?INTERVENTIONS - Miscellaneous ?'[]'$  - External ear exam 0 ?'[]'$  - 0 ?Specimen Collection (cultures, biopsies, blood, body fluids, etc.) ?'[]'$  - 0 ?Specimen(s) / Culture(s) sent or taken to Lab for analysis ?'[]'$  - 0 ?Patient Transfer (multiple staff / Civil Service fast streamer / Similar devices) ?'[]'$  - 0 ?Simple Staple / Suture removal (25 or less) ?'[]'$  - 0 ?Complex Staple / Suture removal (26 or more) ?'[]'$  - 0 ?Hypo / Hyperglycemic Management (close monitor of Blood Glucose) ?'[]'$  - 0 ?Ankle / Brachial Index (ABI) - do not check if billed separately ?X- 1 5 ?Vital Signs ?Has the patient been seen at the hospital within the last three years: Yes ?Total Score: 75 ?Level Of Care: New/Established - Level ?2 ?Electronic Signature(s) ?Signed: 12/13/2021 4:04:50 PM By: Carlene Coria RN ?Entered By: Carlene Coria on 12/11/2021 11:27:50 ?Benjamin Robles, Benjamin Robles ([id number]) ?-------------------------------------------------------------------------------- ?Encounter Discharge Information Details ?Patient Name: Benjamin Robles, Benjamin Robles. ?Date of Service: 12/11/2021 11:00 AM ?Medical Record Number: [id number] ?Patient Account Number: [id number] ?Date of Birth/Sex: 1956/08/11 (66 y.o. M) ?Treating RN: Carlene Coria ?Primary Care Safari Cinque:  Ria Bush Other Clinician: ?Referring Jarone Ostergaard: Ria Bush ?Treating Senta Kantor/Extender: Kalman Shan ?Weeks in Treatment: 1 ?Encounter Discharge Information Items ?Discharge Condition: Stable ?Ambulatory Status: Ambulatory ?Discharge Destination: Home ?Transportation: Private Auto ?Accompanied By: self ?Schedule Follow-up Appointment: Yes ?Clinical Summary of Care: Patient Declined ?Electronic Signature(s) ?Signed: 12/13/2021 4:04:50 PM By: Carlene Coria RN ?Entered By: Carlene Coria on 12/11/2021 11:29:04 ?Benjamin Robles, Benjamin Robles ([id number]) ?-------------------------------------------------------------------------------- ?Lower Extremity Assessment Details ?Patient Name: Benjamin Robles, Benjamin Robles. ?Date of Service: 12/11/2021 11:00 AM ?Medical Record Number: [id number] ?Patient Account Number: [id number] ?Date of Birth/Sex: [birth year] (66 y.o. M) ?Treating RN: Carlene Coria ?Primary Care Elior Robinette: Ria Bush Other Clinician: ?Referring Lener Ventresca: Ria Bush ?Treating Kashina Mecum/Extender: Kalman Shan ?Weeks in Treatment: 1 ?Electronic Signature(s) ?Signed: 12/13/2021 4:04:50 PM By: Carlene Coria RN ?Entered By: Carlene Coria on 12/11/2021 11:03:59 ?Benjamin Robles, Benjamin Robles ([id number]) ?-------------------------------------------------------------------------------- ?Multi Wound Chart Details ?Patient Name: Benjamin Robles, Benjamin Robles. ?Date of Service: 12/11/2021 11:00 AM ?Medical Record Number: [id number] ?Patient Account Number: [id number] ?Date of Birth/Sex: July 25, 1956 (66 y.o. M) ?Treating RN: Carlene Coria ?Primary Care Jermya Dowding: Ria Bush Other Clinician: ?Referring Kathlynn Swofford: Ria Bush ?Treating Corie Vavra/Extender: Kalman Shan ?Weeks in Treatment: 1 ?Vital Signs ?Height(in): 73 ?Pulse(bpm): 66 ?Weight(lbs): 180 ?Blood Pressure(mmHg): 179/87 ?Body Mass Index(BMI): 23.7 ?Temperature(??F): 98.6 ?Respiratory Rate(breaths/min): 18 ?Photos: [1:No Photos] [N/A:N/A] ?Wound Location: [1:Back] [N/A:N/A] ?Wounding Event:  [1:Gradually Appeared] [N/A:N/A] ?Primary Etiology: [1:Abscess] [N/A:N/A] ?Date Acquired: [1:11/06/2021] [N/A:N/A] ?Weeks of Treatment: [1:1] [N/A:N/A] ?Wound Status: [1:Open] [N/A:N/A] ?Wound Recurrence: [1:No] [N/A:

## 2021-12-14 ENCOUNTER — Other Ambulatory Visit: Payer: Self-pay | Admitting: Family Medicine

## 2021-12-14 DIAGNOSIS — D72820 Lymphocytosis (symptomatic): Secondary | ICD-10-CM

## 2021-12-18 ENCOUNTER — Ambulatory Visit: Payer: Managed Care, Other (non HMO) | Admitting: Internal Medicine

## 2021-12-19 ENCOUNTER — Encounter: Payer: Managed Care, Other (non HMO) | Admitting: Internal Medicine

## 2021-12-20 ENCOUNTER — Ambulatory Visit (INDEPENDENT_AMBULATORY_CARE_PROVIDER_SITE_OTHER): Payer: Managed Care, Other (non HMO) | Admitting: Internal Medicine

## 2021-12-20 ENCOUNTER — Encounter: Payer: Self-pay | Admitting: Internal Medicine

## 2021-12-20 VITALS — BP 130/68 | HR 82 | Ht 72.5 in | Wt 178.0 lb

## 2021-12-20 DIAGNOSIS — K219 Gastro-esophageal reflux disease without esophagitis: Secondary | ICD-10-CM | POA: Diagnosis not present

## 2021-12-20 DIAGNOSIS — Z1211 Encounter for screening for malignant neoplasm of colon: Secondary | ICD-10-CM | POA: Diagnosis not present

## 2021-12-20 DIAGNOSIS — R1013 Epigastric pain: Secondary | ICD-10-CM

## 2021-12-20 MED ORDER — PLENVU 140 G PO SOLR: 1.0000 | Freq: Once | ORAL | 0 refills | Status: AC

## 2021-12-20 NOTE — Progress Notes (Signed)
HISTORY OF PRESENT ILLNESS: ? ?Benjamin Robles is a 66 y.o. male with chronic intermittent GI complaints dating back greater than 25 years, GERD, IBS, anxiety with depression, and remote nonspecific mild ileitis on colonoscopy in 2014.  He was last evaluated in this office November 2012 regarding complaints of early satiety with bloating as well as breakthrough reflux symptoms.  See that dictation.  He subsequently underwent colonoscopy and upper endoscopy August 07, 2011.  Colonoscopy was entire way normal including the terminal ileum.  Follow-up in 10 years recommended.  Upper endoscopy revealed a small nodule in the cardia which was oozing.  This was treated with endoscopic hemostatic therapy.  The exam was otherwise normal.  GI symptoms were thought to be functional.  He has not been seen since. ? ?Patient tells me that he takes Nexium once daily.  He does have occasional breakthrough symptoms for which she takes Tums.  He denies dysphagia.  He continues to report problems with early satiety.  There has been bloating.  He is concerned and interested in follow-up endoscopy.  He does tell me that he has had elevated white blood cell count for which his PCP is referring him to a hematologist.  White blood cell count December 04, 2021 was 13.3.  Basic metabolic panel was unremarkable.  Most recent liver tests were normal.  Abdominal ultrasound in 2017 to evaluate early satiety was unremarkable.  He tells me that he was recently let go from his job after 30 years.  He is okay with that circumstance. ? ?REVIEW OF SYSTEMS: ? ?All non-GI ROS negative unless otherwise stated in the HPI except for anxiety, night sweats, sore throat ? ?Past Medical History:  ?Diagnosis Date  ? Allergy   ? SINUS ALLEGIES  ? Anticoagulant disorder ([place])   ? Anxiety   ? BPH (benign prostatic hypertrophy)   ? mild  ? Bradycardia   ? Depression   ? GERD (gastroesophageal reflux disease)   ? History of colon polyps   ? normal colonoscopy 2012  ? Pain  of right sternoclavicular joint   ? 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 Umbilical hernia   ? ? ?Past Surgical History:  ?Procedure Laterality Date  ? COLONOSCOPY  08/07/2011  ? WNL, rpt 10 yrs  ? HERNIA REPAIR    ? x2  ? UPPER GASTROINTESTINAL ENDOSCOPY  08/07/2011  ? chronic superficial gastritis, oozing nodule clipped, neg H pylori  ? ? ?Social History ?Benjamin Robles  reports that he has quit smoking. He has never used smokeless tobacco. He reports current alcohol use. He reports that he does not use drugs. ? ?family history includes Depression in his father; Hypertension in his mother; Prostate cancer (age of onset: 9) in his father. ? ?Allergies  ?Allergen Reactions  ? Amoxicillin-Pot Clavulanate   ?  REACTION: achey, itch  ? ? ?  ? ?PHYSICAL EXAMINATION: ?Vital signs: BP 130/68   Pulse 82   Ht 6' 0.5" (1.842 m)   Wt 178 lb (80.7 kg)   SpO2 98%   BMI 23.81 kg/m?   ?Constitutional: generally well-appearing, no acute distress ?Psychiatric: alert and oriented x3, cooperative.  Appears somewhat anxious. ?Eyes: extraocular movements intact, anicteric, conjunctiva pink ?Mouth: oral pharynx moist, no lesions ?Neck: supple no lymphadenopathy ?Cardiovascular: heart regular rate and rhythm, no murmur ?Lungs: clear to auscultation bilaterally ?Abdomen: soft, nontender, nondistended, no obvious ascites, no peritoneal signs, normal bowel sounds, no organomegaly ?Rectal: Deferred to colonoscopy ?Extremities: no clubbing, cyanosis, or lower  extremity edema bilaterally ?Skin: no lesions on visible extremities ?Neuro: No focal deficits.  Cranial nerves intact ? ?ASSESSMENT: ? ?1.  Chronic GERD.  Occasional breakthrough despite Nexium. ?2.  EGD 2012 with proximal gastric nodule (bleeding and treated with endoscopic hemostatic therapy) ?3.  Dyspepsia.  Early satiety and bloating.  These seem to be consistent with his chronic complaints of years gone by ?4.  Colon cancer screening.  Baseline risk.  Appropriate candidate  without contraindication ? ? ?PLAN: ? ?1.  Reflux precautions ?2.  Continue Nexium daily.  Medication risk reviewed ?3.  Antacids on demand ?4.  Upper endoscopy to evaluate reflux with breakthrough symptoms and dyspeptic symptoms.The nature of the procedure, as well as the risks, benefits, and alternatives were carefully and thoroughly reviewed with the patient. Ample time for discussion and questions allowed. The patient understood, was satisfied, and agreed to proceed.  ?5.  Colonoscopy for colon cancer screening.The nature of the procedure, as well as the risks, benefits, and alternatives were carefully and thoroughly reviewed with the patient. Ample time for discussion and questions allowed. The patient understood, was satisfied, and agreed to proceed.  ?6.  Ongoing general medical care with Dr. Danise Mina ? ? ? ?  ?

## 2021-12-20 NOTE — Patient Instructions (Signed)
If you are age 66 or older, your body mass index should be between 23-30. Your Body mass index is 23.81 kg/m?Marland Kitchen If this is out of the aforementioned range listed, please consider follow up with your Primary Care Provider. ? ?If you are age 50 or younger, your body mass index should be between 19-25. Your Body mass index is 23.81 kg/m?Marland Kitchen If this is out of the aformentioned range listed, please consider follow up with your Primary Care Provider.  ? ?________________________________________________________ ? ?The [facility] providers would like to encourage you to use [facility] to communicate with providers for non-urgent requests or questions.  Due to long hold times on the telephone, sending your provider a message by [facility] may be a faster and more efficient way to get a response.  Please allow 48 business hours for a response.  Please remember that this is for non-urgent requests.  ?_______________________________________________________ ? ?You have been scheduled for an endoscopy and colonoscopy. Please follow the written instructions given to you at your visit today. ?Please pick up your prep supplies at the pharmacy within the next 1-3 days. ?If you use inhalers (even only as needed), please bring them with you on the day of your procedure. ? ?

## 2021-12-23 HISTORY — PX: ESOPHAGOGASTRODUODENOSCOPY: SHX1529

## 2021-12-23 HISTORY — PX: COLONOSCOPY: SHX174

## 2021-12-24 ENCOUNTER — Encounter: Payer: Self-pay | Admitting: Oncology

## 2021-12-24 ENCOUNTER — Inpatient Hospital Stay: Payer: Managed Care, Other (non HMO) | Attending: Oncology | Admitting: Oncology

## 2021-12-24 ENCOUNTER — Inpatient Hospital Stay: Payer: Managed Care, Other (non HMO)

## 2021-12-24 VITALS — BP 156/78 | HR 69 | Temp 97.4°F | Ht 72.0 in | Wt 177.0 lb

## 2021-12-24 DIAGNOSIS — M25519 Pain in unspecified shoulder: Secondary | ICD-10-CM | POA: Diagnosis not present

## 2021-12-24 DIAGNOSIS — F1090 Alcohol use, unspecified, uncomplicated: Secondary | ICD-10-CM

## 2021-12-24 DIAGNOSIS — Z789 Other specified health status: Secondary | ICD-10-CM | POA: Diagnosis not present

## 2021-12-24 DIAGNOSIS — R5383 Other fatigue: Secondary | ICD-10-CM | POA: Diagnosis not present

## 2021-12-24 DIAGNOSIS — D72820 Lymphocytosis (symptomatic): Secondary | ICD-10-CM

## 2021-12-24 DIAGNOSIS — Z87891 Personal history of nicotine dependence: Secondary | ICD-10-CM | POA: Diagnosis not present

## 2021-12-24 DIAGNOSIS — Z8042 Family history of malignant neoplasm of prostate: Secondary | ICD-10-CM | POA: Diagnosis not present

## 2021-12-24 LAB — COMPREHENSIVE METABOLIC PANEL
ALT: 18 U/L (ref 0–44)
AST: 21 U/L (ref 15–41)
Albumin: 4.7 g/dL (ref 3.5–5.0)
Alkaline Phosphatase: 67 U/L (ref 38–126)
Anion gap: 6 (ref 5–15)
BUN: 13 mg/dL (ref 8–23)
CO2: 28 mmol/L (ref 22–32)
Calcium: 9.4 mg/dL (ref 8.9–10.3)
Chloride: 101 mmol/L (ref 98–111)
Creatinine, Ser: 1.01 mg/dL (ref 0.61–1.24)
GFR, Estimated: >60 mL/min (ref >60)
Glucose, Bld: 119 mg/dL — ABNORMAL HIGH (ref 70–99)
Potassium: 3.8 mmol/L (ref 3.5–5.1)
Sodium: 135 mmol/L (ref 135–145)
Total Bilirubin: 0.8 mg/dL (ref 0.3–1.2)
Total Protein: 7.7 g/dL (ref 6.5–8.1)

## 2021-12-24 LAB — CBC WITH DIFFERENTIAL/PLATELET
Abs Immature Granulocytes: 0.04 10*3/uL (ref 0.00–0.07)
Basophils Absolute: 0.1 10*3/uL (ref 0.0–0.1)
Basophils Relative: 0 %
Eosinophils Absolute: 0 10*3/uL (ref 0.0–0.5)
Eosinophils Relative: 0 %
HCT: 45.2 % (ref 39.0–52.0)
Hemoglobin: 15.4 g/dL (ref 13.0–17.0)
Immature Granulocytes: 0 %
Lymphocytes Relative: 60 %
Lymphs Abs: 8.9 10*3/uL — ABNORMAL HIGH (ref 0.7–4.0)
MCH: 30.4 pg (ref 26.0–34.0)
MCHC: 34.1 g/dL (ref 30.0–36.0)
MCV: 89.3 fL (ref 80.0–100.0)
Monocytes Absolute: 0.5 10*3/uL (ref 0.1–1.0)
Monocytes Relative: 3 %
Neutro Abs: 5.6 10*3/uL (ref 1.7–7.7)
Neutrophils Relative %: 37 %
Platelets: 319 10*3/uL (ref 150–400)
RBC: 5.06 MIL/uL (ref 4.22–5.81)
RDW: 12.6 % (ref 11.5–15.5)
WBC: 15.1 10*3/uL — ABNORMAL HIGH (ref 4.0–10.5)
nRBC: 0 % (ref 0.0–0.2)

## 2021-12-24 LAB — HEPATITIS PANEL, ACUTE
HCV Ab: NONREACTIVE
Hep A IgM: NONREACTIVE
Hep B C IgM: NONREACTIVE
Hepatitis B Surface Ag: NONREACTIVE

## 2021-12-24 LAB — TSH: TSH: 0.802 u[IU]/mL (ref 0.350–4.500)

## 2021-12-24 LAB — LACTATE DEHYDROGENASE: LDH: 111 U/L (ref 98–192)

## 2021-12-24 LAB — TECHNOLOGIST SMEAR REVIEW: Plt Morphology: ADEQUATE

## 2021-12-24 LAB — HIV ANTIBODY (ROUTINE TESTING W REFLEX): HIV Screen 4th Generation wRfx: NONREACTIVE

## 2021-12-24 NOTE — Progress Notes (Signed)
?Hematology/Oncology Consult note ?[phone] [phone] ?  ? ?   ? ? ?Patient Care Team: ?Ria Bush, MD as PCP - General ? ?REFERRING PROVIDER: ?Ria Bush, MD  ?CHIEF COMPLAINTS/REASON FOR VISIT:  ?Evaluation of lymphocytosis ? ?HISTORY OF PRESENTING ILLNESS:  ? ?Benjamin Robles is a  66 y.o.  male with PMH listed below was seen in consultation at the request of  Ria Bush, MD  for evaluation of lymphocytosis. ? ?Patient had a recent blood work done obtained by primary care provider for ?12/04/2021, WBC 13.3, differential showed primarily lymphocytosis with absolute lymphocyte 7.5. ?I reviewed patient's previous blood records. ? ?04/24/2021, CBC showed a total white count of 15.1, absolute lymphocyte 8.2.  Patient reports having COVID 19 infection in July 2022. ?11/19/2021, CBC showed total white count of 14.5, absolute lymphocyte 8.5. ?Patient reports that he had a infected sebaceous cyst at that time. ? ?Denies any significant unintentional weight loss.  He used to weigh about 180 pounds.  Today's weight 177 pounds.  His appetite is always fair.  ?He has a chronic stomach discomfort and he follows up with gastroenterology.  He is due for colonoscopy and EGD work-up. ? ?Patient reports mild sweating 2-3 times per week.  No fever. ? ?Former smoker, he stopped in the 1980s.  He drinks 1 beer per day.  Recently has tried to cut back on alcohol consumption. ?Patient has some chronic fatigue which is unchanged. ?+ Arthralgia-shoulder pain ? ?Review of Systems  ?Constitutional:  Positive for fatigue. Negative for appetite change, chills, fever and unexpected weight change.  ?HENT:   Negative for hearing loss and voice change.   ?Eyes:  Negative for eye problems and icterus.  ?Respiratory:  Negative for chest tightness, cough and shortness of breath.   ?Cardiovascular:  Negative for chest pain and leg swelling.  ?Gastrointestinal:  Negative for abdominal distention and abdominal  pain.  ?Endocrine: Negative for hot flashes.  ?Genitourinary:  Negative for difficulty urinating, dysuria and frequency.   ?Musculoskeletal:  Positive for arthralgias.  ?Skin:  Negative for itching and rash.  ?Neurological:  Negative for light-headedness and numbness.  ?Hematological:  Negative for adenopathy. Does not bruise/bleed easily.  ?Psychiatric/Behavioral:  Negative for confusion.   ? ?MEDICAL HISTORY:  ?Past Medical History:  ?Diagnosis Date  ? Allergy   ? SINUS ALLEGIES  ? 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 Anxiety   ? BPH (benign prostatic hypertrophy)   ? mild  ? Bradycardia   ? Depression   ? GERD (gastroesophageal reflux disease)   ? History of colon polyps   ? normal colonoscopy 2012  ? Pain of right sternoclavicular joint   ? 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 Umbilical hernia   ? ? ?SURGICAL HISTORY: ?Past Surgical History:  ?Procedure Laterality Date  ? COLONOSCOPY  08/07/2011  ? WNL, rpt 10 yrs  ? HERNIA REPAIR    ? x2  ? UPPER GASTROINTESTINAL ENDOSCOPY  08/07/2011  ? chronic superficial gastritis, oozing nodule clipped, neg H pylori  ? ? ?SOCIAL HISTORY: ?Social History  ? ?Socioeconomic History  ? Marital status: Married  ?  Spouse name: Not on file  ? Number of children: 2  ? Years of education: Not on file  ? Highest education level: Not on file  ?Occupational History  ? Occupation: Tech support  ?  Employer: [employer]  ?Tobacco Use  ? Smoking status: Former  ?  Packs/day: 0.50  ?  Years: 7.00  ?  Pack  years: 3.50  ?  Types: Cigarettes  ?  Quit date: 56  ?  Years since quitting: 40.3  ? Smokeless tobacco: Never  ?Substance and Sexual Activity  ? Alcohol use: Yes  ?  Comment: 1 beer/day  ? Drug use: No  ? Sexual activity: Yes  ?Other Topics Concern  ? Not on file  ?Social History Narrative  ? "Will"  ? Lives with wife, 2 cats and 2 dogs, grown children  ? Occupation: Air cabin crew support  ? Activity: no regular exercise routine  ? Diet: good water, fruits/vegetables daily   ? ?Social Determinants of Health  ? ?Financial Resource Strain: Not on file  ?Food Insecurity: Not on file  ?Transportation Needs: Not on file  ?Physical Activity: Not on file  ?Stress: Not on file  ?Social Connections: Not on file  ?Intimate Partner Violence: Not on file  ? ? ?FAMILY HISTORY: ?Family History  ?Problem Relation Age of Onset  ? Hypertension Mother   ? Depression Father   ? Prostate cancer Father 7  ?     slow growing  ? CAD Neg Hx   ? Stroke Neg Hx   ? Diabetes Neg Hx   ? Colon cancer Neg Hx   ? Rectal cancer Neg Hx   ? Stomach cancer Neg Hx   ? ? ?ALLERGIES:  is allergic to amoxicillin-pot clavulanate. ? ?MEDICATIONS:  ?Current Outpatient Medications  ?Medication Sig Dispense Refill  ? calcium carbonate (TUMS - DOSED IN MG ELEMENTAL CALCIUM) 500 MG chewable tablet Chew 1 tablet by mouth as needed.      ? Cholecalciferol (VITAMIN D3 PO) Take 1 capsule by mouth daily.    ? cyanocobalamin (V-R VITAMIN B-12) 500 MCG tablet Take 1 tablet (500 mcg total) by mouth daily.    ? esomeprazole (NEXIUM) 20 MG capsule Take 1 capsule (20 mg total) by mouth daily at 12 noon.    ? fluticasone (FLONASE) 50 MCG/ACT nasal spray USE 2 SPRAYS NASALY DAILY 48 g 3  ? hydrOXYzine (ATARAX/VISTARIL) 25 MG tablet TAKE 1/2-1 TABLET BY MOUTH TWO TIMES A DAY AS NEEDED FOR ANXIETY (SEDATION PRECAUTIONS) 30 tablet 1  ? magnesium 30 MG tablet Take 30 mg by mouth daily.    ? multivitamin (THERAGRAN) per tablet Take 1 tablet by mouth daily.      ? psyllium (METAMUCIL) 58.6 % powder Take 1 packet by mouth daily.     ? sildenafil (VIAGRA) 50 MG tablet TAKE ONE TABLET BY MOUTH DAILY AS NEEDED FOR ERECTILE DYSFUNCTION 10 tablet 3  ? ?No current facility-administered medications for this visit.  ? ? ? ?PHYSICAL EXAMINATION: ?ECOG PERFORMANCE STATUS: 0 - Asymptomatic ?Vitals:  ? 12/24/21 0919  ?BP: (!) 156/78  ?Pulse: 69  ?Temp: (!) 97.4 ?F (36.3 ?C)  ? ?Filed Weights  ? 12/24/21 0919  ?Weight: 177 lb (80.3 kg)  ? ? ?Physical  Exam ?Constitutional:   ?   General: He is not in acute distress. ?HENT:  ?   Head: Normocephalic and atraumatic.  ?Eyes:  ?   General: No scleral icterus. ?Cardiovascular:  ?   Rate and Rhythm: Normal rate and regular rhythm.  ?   Heart sounds: Normal heart sounds.  ?Pulmonary:  ?   Effort: Pulmonary effort is normal. No respiratory distress.  ?   Breath sounds: No wheezing.  ?Abdominal:  ?   General: Bowel sounds are normal. There is no distension.  ?   Palpations: Abdomen is soft.  ?Musculoskeletal:     ?  General: No deformity. Normal range of motion.  ?   Cervical back: Normal range of motion and neck supple.  ?Skin: ?   General: Skin is warm and dry.  ?   Findings: No erythema or rash.  ?Neurological:  ?   Mental Status: He is alert and oriented to person, place, and time. Mental status is at baseline.  ?   Cranial Nerves: No cranial nerve deficit.  ?   Coordination: Coordination normal.  ?Psychiatric:     ?   Mood and Affect: Mood normal.  ? ? ?LABORATORY DATA:  ?I have reviewed the data as listed ?Lab Results  ?Component Value Date  ? WBC 15.1 (H) 12/24/2021  ? HGB 15.4 12/24/2021  ? HCT 45.2 12/24/2021  ? MCV 89.3 12/24/2021  ? PLT 319 12/24/2021  ? ?Recent Labs  ?  04/24/21 ?1233 11/19/21 ?0734 12/04/21 ?1252 12/04/21 ?1257 12/24/21 ?4825  ?NA 138 136 139  --  135  ?K 4.4 4.7 4.5  --  3.8  ?CL 101 101 103  --  101  ?CO2 '31 28 29  '$ --  28  ?GLUCOSE 93 103* 126*  --  119*  ?BUN '17 12 14  '$ --  13  ?CREATININE 1.15 1.47 1.16  --  1.01  ?CALCIUM 10.1 9.9 9.3  --  9.4  ?GFRNONAA  --   --   --   --  >60  ?PROT 7.3 7.0  --  6.6 7.7  ?ALBUMIN 4.6 4.8  --   --  4.7  ?AST 17 16  --   --  21  ?ALT 14 13  --   --  18  ?ALKPHOS 62 71  --   --  67  ?BILITOT 0.5 0.8  --   --  0.8  ? ?Iron/TIBC/Ferritin/ %Sat ?No results found for: IRON, TIBC, FERRITIN, IRONPCTSAT  ? ? ?RADIOGRAPHIC STUDIES: ?I have personally reviewed the radiological images as listed and agreed with the findings in the report. ?No results  found. ? ? ? ?ASSESSMENT & PLAN:  ?1. Lymphocytosis   ?2. Other fatigue   ?3. Alcohol use   ? ?#Chronic lymphocytosis, discussed with patient wife that this could be secondary to acute or chronic inflammation, medication side effects,

## 2021-12-25 LAB — KAPPA/LAMBDA LIGHT CHAINS
Kappa free light chain: 16.7 mg/L (ref 3.3–19.4)
Kappa, lambda light chain ratio: 1.26 (ref 0.26–1.65)
Lambda free light chains: 13.3 mg/L (ref 5.7–26.3)

## 2021-12-26 LAB — COMP PANEL: LEUKEMIA/LYMPHOMA: Immunophenotypic Profile: 43

## 2021-12-26 LAB — ANTINUCLEAR ANTIBODIES, IFA: ANA Ab, IFA: NEGATIVE

## 2021-12-29 ENCOUNTER — Encounter: Payer: Self-pay | Admitting: Oncology

## 2021-12-30 NOTE — Telephone Encounter (Signed)
Please advise 

## 2021-12-31 ENCOUNTER — Telehealth: Payer: Self-pay | Admitting: Oncology

## 2021-12-31 ENCOUNTER — Encounter: Payer: Self-pay | Admitting: Family Medicine

## 2021-12-31 DIAGNOSIS — F4323 Adjustment disorder with mixed anxiety and depressed mood: Secondary | ICD-10-CM

## 2021-12-31 NOTE — Telephone Encounter (Signed)
Please call pt to move up appt. ( A day that she doesn't have too many pts)  ?

## 2021-12-31 NOTE — Telephone Encounter (Signed)
pt called in to r/s appointment .Benjamin Robles  ?

## 2022-01-03 MED ORDER — VENLAFAXINE HCL ER 37.5 MG PO CP24: 37.5000 mg | ORAL_CAPSULE | Freq: Every day | ORAL | 6 refills | Status: DC

## 2022-01-07 ENCOUNTER — Encounter: Payer: Self-pay | Admitting: Internal Medicine

## 2022-01-07 ENCOUNTER — Ambulatory Visit (AMBULATORY_SURGERY_CENTER): Payer: Managed Care, Other (non HMO) | Admitting: Internal Medicine

## 2022-01-07 VITALS — BP 98/51 | HR 57 | Temp 96.8°F | Resp 11 | Ht 72.0 in | Wt 178.0 lb

## 2022-01-07 DIAGNOSIS — D128 Benign neoplasm of rectum: Secondary | ICD-10-CM

## 2022-01-07 DIAGNOSIS — K621 Rectal polyp: Secondary | ICD-10-CM

## 2022-01-07 DIAGNOSIS — Z1211 Encounter for screening for malignant neoplasm of colon: Secondary | ICD-10-CM | POA: Diagnosis present

## 2022-01-07 DIAGNOSIS — K219 Gastro-esophageal reflux disease without esophagitis: Secondary | ICD-10-CM

## 2022-01-07 MED ORDER — SODIUM CHLORIDE 0.9 % IV SOLN: 500.0000 mL | Freq: Once | INTRAVENOUS | Status: DC

## 2022-01-07 NOTE — Progress Notes (Signed)
VSS, transported to PACU °

## 2022-01-07 NOTE — Op Note (Signed)
[place] ?Patient Name: Benjamin Robles ?Procedure Date: 01/07/2022 3:03 PM ?MRN: [id number] ?Endoscopist: Docia Chuck. [name] , MD ?Age: 66 ?Referring MD:  ?Date of Birth: Apr 30, 1956 ?Gender: Male ?Account #: [id number] ?Procedure:                Colonoscopy with cold snare polypectomy x 1. ?Indications:              Screening for colorectal malignant neoplasm.  ?                          Previous examinations 2004 and 2012 were negative  ?                          for neoplasia ?Medicines:                Monitored Anesthesia Care ?Procedure:                Pre-Anesthesia Assessment: ?                          - Prior to the procedure, a History and Physical  ?                          was performed, and patient medications and  ?                          allergies were reviewed. The patient's tolerance of  ?                          previous anesthesia was also reviewed. The risks  ?                          and benefits of the procedure and the sedation  ?                          options and risks were discussed with the patient.  ?                          All questions were answered, and informed consent  ?                          was obtained. Prior Anticoagulants: The patient has  ?                          taken no previous anticoagulant or antiplatelet  ?                          agents. ASA Grade Assessment: II - A patient with  ?                          mild systemic disease. After reviewing the risks  ?                          and benefits, the patient was deemed in  ?  satisfactory condition to undergo the procedure. ?                          After obtaining informed consent, the colonoscope  ?                          was passed under direct vision. Throughout the  ?                          procedure, the patient's blood pressure, pulse, and  ?                          oxygen saturations were monitored continuously. The  ?                          Olympus CF-[id number]  (#[id number]) Colonoscope was  ?                          introduced through the anus and advanced to the the  ?                          cecum, identified by appendiceal orifice and  ?                          ileocecal valve. The terminal ileum, ileocecal  ?                          valve, appendiceal orifice, and rectum were  ?                          photographed. The quality of the bowel preparation  ?                          was excellent. The colonoscopy was performed  ?                          without difficulty. The patient tolerated the  ?                          procedure well. The bowel preparation used was  ?                          SUPREP via split dose instruction. ?Scope In: 3:16:03 PM ?Scope Out: 3:30:09 PM ?Scope Withdrawal Time: 0 hours 10 minutes 3 seconds  ?Total Procedure Duration: 0 hours 14 minutes 6 seconds  ?Findings:                 The terminal ileum appeared normal. ?                          A 3 mm polyp was found in the rectum. The polyp was  ?                          sessile. The polyp was removed with a cold snare.  ?  Resection and retrieval were complete. ?                          The exam was otherwise without abnormality on  ?                          direct and retroflexion views. ?Complications:            No immediate complications. Estimated blood loss:  ?                          None. ?Estimated Blood Loss:     Estimated blood loss: none. ?Impression:               - The examined portion of the ileum was normal. ?                          - One 3 mm polyp in the rectum, removed with a cold  ?                          snare. Resected and retrieved. ?                          - The examination was otherwise normal on direct  ?                          and retroflexion views. ?Recommendation:           - Repeat colonoscopy in 10 years for surveillance. ?                          - Patient has a contact number available for  ?                           emergencies. The signs and symptoms of potential  ?                          delayed complications were discussed with the  ?                          patient. Return to normal activities tomorrow.  ?                          Written discharge instructions were provided to the  ?                          patient. ?                          - Resume previous diet. ?                          - Continue present medications. ?                          - Await pathology results. ?Docia Chuck. [name], MD ?01/07/2022 3:34:42 PM ?This report has been  signed electronically. ?

## 2022-01-07 NOTE — Progress Notes (Signed)
Called to room to assist during endoscopic procedure.  Patient ID and intended procedure confirmed with present staff. Received instructions for my participation in the procedure from the performing physician.  

## 2022-01-07 NOTE — Op Note (Signed)
[place] ?Patient Name: Benjamin Robles ?Procedure Date: 01/07/2022 3:02 PM ?MRN: [id number] ?Endoscopist: Docia Chuck. [name] , MD ?Age: 66 ?Referring MD:  ?Date of Birth: [birth year] ?Gender: Male ?Account #: [id number] ?Procedure:                Upper GI endoscopy ?Indications:              Dyspepsia, Esophageal reflux ?Medicines:                Monitored Anesthesia Care ?Procedure:                Pre-Anesthesia Assessment: ?                          - Prior to the procedure, a History and Physical  ?                          was performed, and patient medications and  ?                          allergies were reviewed. The patient's tolerance of  ?                          previous anesthesia was also reviewed. The risks  ?                          and benefits of the procedure and the sedation  ?                          options and risks were discussed with the patient.  ?                          All questions were answered, and informed consent  ?                          was obtained. Prior Anticoagulants: The patient has  ?                          taken no previous anticoagulant or antiplatelet  ?                          agents. ASA Grade Assessment: II - A patient with  ?                          mild systemic disease. After reviewing the risks  ?                          and benefits, the patient was deemed in  ?                          satisfactory condition to undergo the procedure. ?                          After obtaining informed consent, the endoscope was  ?  passed under direct vision. Throughout the  ?                          procedure, the patient's blood pressure, pulse, and  ?                          oxygen saturations were monitored continuously. The  ?                          Endoscope was introduced through the mouth, and  ?                          advanced to the second part of duodenum. The upper  ?                          GI endoscopy was accomplished  without difficulty.  ?                          The patient tolerated the procedure well. ?Scope In: ?Scope Out: ?Findings:                 The esophagus was normal. ?                          The stomach was normal. ?                          The examined duodenum was normal. ?                          The cardia and gastric fundus were normal on  ?                          retroflexion. ?Complications:            No immediate complications. ?Estimated Blood Loss:     Estimated blood loss: none. ?Impression:               - Normal esophagus. ?                          - Normal stomach. ?                          - Normal examined duodenum. ?                          - No specimens collected. ?Recommendation:           - Patient has a contact number available for  ?                          emergencies. The signs and symptoms of potential  ?                          delayed complications were discussed with the  ?  patient. Return to normal activities tomorrow.  ?                          Written discharge instructions were provided to the  ?                          patient. ?                          - Resume previous diet. ?                          - Continue present medications. ?Docia Chuck. [name], MD ?01/07/2022 3:47:20 PM ?This report has been signed electronically. ?

## 2022-01-07 NOTE — Patient Instructions (Signed)
Handout given on polyps ? ?YOU HAD AN ENDOSCOPIC PROCEDURE TODAY AT [facility]:   Refer to the procedure report that was given to you for any specific questions about what was found during the examination.  If the procedure report does not answer your questions, please call your gastroenterologist to clarify.  If you requested that your care partner not be given the details of your procedure findings, then the procedure report has been included in a sealed envelope for you to review at your convenience later. ? ?YOU SHOULD EXPECT: Some feelings of bloating in the abdomen. Passage of more gas than usual.  Walking can help get rid of the air that was put into your GI tract during the procedure and reduce the bloating. If you had a lower endoscopy (such as a colonoscopy or flexible sigmoidoscopy) you may notice spotting of blood in your stool or on the toilet paper. If you underwent a bowel prep for your procedure, you may not have a normal bowel movement for a few days. ? ?Please Note:  You might notice some irritation and congestion in your nose or some drainage.  This is from the oxygen used during your procedure.  There is no need for concern and it should clear up in a day or so. ? ?SYMPTOMS TO REPORT IMMEDIATELY: ? ?Following lower endoscopy (colonoscopy or flexible sigmoidoscopy): ? Excessive amounts of blood in the stool ? Significant tenderness or worsening of abdominal pains ? Swelling of the abdomen that is new, acute ? Fever of 100?F or higher ? ?Following upper endoscopy (EGD) ? Vomiting of blood or coffee ground material ? New chest pain or pain under the shoulder blades ? Painful or persistently difficult swallowing ? New shortness of breath ? Fever of 100?F or higher ? Black, tarry-looking stools ? ?For urgent or emergent issues, a gastroenterologist can be reached at any hour by calling [phone]. ?Do not use MyChart messaging for urgent concerns.  ? ? ?DIET:  We do recommend a  small meal at first, but then you may proceed to your regular diet.  Drink plenty of fluids but you should avoid alcoholic beverages for 24 hours. ? ?ACTIVITY:  You should plan to take it easy for the rest of today and you should NOT DRIVE or use heavy machinery until tomorrow (because of the sedation medicines used during the test).   ? ?FOLLOW UP: ?Our staff will call the number listed on your records 48-72 hours following your procedure to check on you and address any questions or concerns that you may have regarding the information given to you following your procedure. If we do not reach you, we will leave a message.  We will attempt to reach you two times.  During this call, we will ask if you have developed any symptoms of COVID 19. If you develop any symptoms (ie: fever, flu-like symptoms, shortness of breath, cough etc.) before then, please call [phone].  If you test positive for Covid 19 in the 2 weeks post procedure, please call and report this information to [place].   ? ?If any biopsies were taken you will be contacted by phone or by letter within the next 1-3 weeks.  Please call us at [phone] if you have not heard about the biopsies in 3 weeks.  ? ? ?SIGNATURES/CONFIDENTIALITY: ?You and/or your care partner have signed paperwork which will be entered into your electronic medical record.  These signatures attest to the fact that that the  information above on your After Visit Summary has been reviewed and is understood.  Full responsibility of the confidentiality of this discharge information lies with you and/or your care-partner.  ?

## 2022-01-07 NOTE — Progress Notes (Signed)
Pt's states no medical or surgical changes since previsit or office visit. 

## 2022-01-07 NOTE — Progress Notes (Signed)
Editor: Irene Shipper, MD (Physician)      ?       ? ?HISTORY OF PRESENT ILLNESS: ?  ?Benjamin Robles is a 66 y.o. male with chronic intermittent GI complaints dating back greater than 25 years, GERD, IBS, anxiety with depression, and remote nonspecific mild ileitis on colonoscopy in 2004.  He was last evaluated in this office November 2012 regarding complaints of early satiety with bloating as well as breakthrough reflux symptoms.  See that dictation.  He subsequently underwent colonoscopy and upper endoscopy August 07, 2011.  Colonoscopy was entire way normal including the terminal ileum.  Follow-up in 10 years recommended.  Upper endoscopy revealed a small nodule in the cardia which was oozing.  This was treated with endoscopic hemostatic therapy.  The exam was otherwise normal.  GI symptoms were thought to be functional.  He has not been seen since. ? ?Patient tells me that he takes Nexium once daily.  He does have occasional breakthrough symptoms for which she takes Tums.  He denies dysphagia.  He continues to report problems with early satiety.  There has been bloating.  He is concerned and interested in follow-up endoscopy.  He does tell me that he has had elevated white blood cell count for which his PCP is referring him to a hematologist.  White blood cell count December 04, 2021 was 13.3.  Basic metabolic panel was unremarkable.  Most recent liver tests were normal.  Abdominal ultrasound in 2017 to evaluate early satiety was unremarkable.  He tells me that he was recently let go from his job after 30 years.  He is okay with that circumstance. ?  ?REVIEW OF SYSTEMS: ?  ?All non-GI ROS negative unless otherwise stated in the HPI except for anxiety, night sweats, sore throat ?  ?    ?Past Medical History:  ?Diagnosis Date  ? Allergy    ?  SINUS ALLEGIES  ? Anticoagulant disorder ([place])    ? Anxiety    ? BPH (benign prostatic hypertrophy)    ?  mild  ? Bradycardia    ? Depression    ? GERD (gastroesophageal reflux  disease)    ? History of colon polyps    ?  normal colonoscopy 2012  ? Pain of right sternoclavicular joint    ?  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 Umbilical hernia    ?  ?  ?     ?Past Surgical History:  ?Procedure Laterality Date  ? COLONOSCOPY   08/07/2011  ?  WNL, rpt 10 yrs  ? HERNIA REPAIR      ?  x2  ? UPPER GASTROINTESTINAL ENDOSCOPY   08/07/2011  ?  chronic superficial gastritis, oozing nodule clipped, neg H pylori  ?  ?  ?Social History ?FAHAD CISSE  reports that he has quit smoking. He has never used smokeless tobacco. He reports current alcohol use. He reports that he does not use drugs. ?  ?family history includes Depression in his father; Hypertension in his mother; Prostate cancer (age of onset: 46) in his father. ?  ?     ?Allergies  ?Allergen Reactions  ? Amoxicillin-Pot Clavulanate    ?    REACTION: achey, itch  ?  ?  ?  ?  ?PHYSICAL EXAMINATION: ?Vital signs: BP 130/68   Pulse 82   Ht 6' 0.5" (1.842 m)   Wt 178 lb (80.7 kg)   SpO2 98%   BMI 23.81 kg/m?   ?Constitutional:  generally well-appearing, no acute distress ?Psychiatric: alert and oriented x3, cooperative.  Appears somewhat anxious. ?Eyes: extraocular movements intact, anicteric, conjunctiva pink ?Mouth: oral pharynx moist, no lesions ?Neck: supple no lymphadenopathy ?Cardiovascular: heart regular rate and rhythm, no murmur ?Lungs: clear to auscultation bilaterally ?Abdomen: soft, nontender, nondistended, no obvious ascites, no peritoneal signs, normal bowel sounds, no organomegaly ?Rectal: Deferred to colonoscopy ?Extremities: no clubbing, cyanosis, or lower extremity edema bilaterally ?Skin: no lesions on visible extremities ?Neuro: No focal deficits.  Cranial nerves intact ?  ?ASSESSMENT: ?  ?1.  Chronic GERD.  Occasional breakthrough despite Nexium. ?2.  EGD 2012 with proximal gastric nodule (bleeding and treated with endoscopic hemostatic therapy) ?3.  Dyspepsia.  Early satiety and bloating.  These seem to be consistent  with his chronic complaints of years gone by ?4.  Colon cancer screening.  Baseline risk.  Appropriate candidate without contraindication ?  ?  ?PLAN: ?  ?1.  Reflux precautions ?2.  Continue Nexium daily.  Medication risk reviewed ?3.  Antacids on demand ?4.  Upper endoscopy to evaluate reflux with breakthrough symptoms and dyspeptic symptoms.The nature of the procedure, as well as the risks, benefits, and alternatives were carefully and thoroughly reviewed with the patient. Ample time for discussion and questions allowed. The patient understood, was satisfied, and agreed to proceed.  ?5.  Colonoscopy for colon cancer screening.The nature of the procedure, as well as the risks, benefits, and alternatives were carefully and thoroughly reviewed with the patient. Ample time for discussion and questions allowed. The patient understood, was satisfied, and agreed to proceed.  ?6.  Ongoing general medical care with Dr. Danise Mina ?  ?  ?   ?  ? ?

## 2022-01-08 ENCOUNTER — Inpatient Hospital Stay (HOSPITAL_BASED_OUTPATIENT_CLINIC_OR_DEPARTMENT_OTHER): Payer: Managed Care, Other (non HMO) | Admitting: Oncology

## 2022-01-08 ENCOUNTER — Encounter: Payer: Self-pay | Admitting: Oncology

## 2022-01-08 VITALS — BP 131/82 | HR 66 | Resp 18 | Ht 72.0 in | Wt 170.0 lb

## 2022-01-08 DIAGNOSIS — Z7189 Other specified counseling: Secondary | ICD-10-CM | POA: Diagnosis not present

## 2022-01-08 DIAGNOSIS — Z789 Other specified health status: Secondary | ICD-10-CM | POA: Diagnosis not present

## 2022-01-08 DIAGNOSIS — C9110 Chronic lymphocytic leukemia of B-cell type not having achieved remission: Secondary | ICD-10-CM | POA: Diagnosis not present

## 2022-01-08 DIAGNOSIS — F1090 Alcohol use, unspecified, uncomplicated: Secondary | ICD-10-CM

## 2022-01-08 DIAGNOSIS — D72820 Lymphocytosis (symptomatic): Secondary | ICD-10-CM | POA: Diagnosis not present

## 2022-01-08 NOTE — Addendum Note (Signed)
Addended by: Earlie Server on: 01/08/2022 04:10 PM ? ? Modules accepted: Orders ? ?

## 2022-01-08 NOTE — Progress Notes (Signed)
?Hematology/Oncology Consult note ?[phone] [phone] ?  ? ?   ? ? ?Patient Care Team: ?Ria Bush, MD as PCP - General ? ?REFERRING PROVIDER: ?Ria Bush, MD  ?CHIEF COMPLAINTS/REASON FOR VISIT:  ?Follow-up for CLL ? ?HISTORY OF PRESENTING ILLNESS:  ?Patient had a recent blood work done obtained by primary care provider for ?12/04/2021, WBC 13.3, differential showed primarily lymphocytosis with absolute lymphocyte 7.5. ?I reviewed patient's previous blood records. ? ?04/24/2021, CBC showed a total white count of 15.1, absolute lymphocyte 8.2.  Patient reports having COVID 19 infection in July 2022. ?11/19/2021, CBC showed total white count of 14.5, absolute lymphocyte 8.5. ?Patient reports that he had a infected sebaceous cyst at that time. ? ?Denies any significant unintentional weight loss.  He used to weigh about 180 pounds.  Today's weight 177 pounds.  His appetite is always fair.  ?He has a chronic stomach discomfort and he follows up with gastroenterology.  He is due for colonoscopy and EGD work-up. ? ?Patient reports mild sweating 2-3 times per week.  No fever. ? ?Former smoker, he stopped in the 1980s.  He drinks 1 beer per day.  Recently has tried to cut back on alcohol consumption. ?Patient has some chronic fatigue which is unchanged. ?+ Arthralgia-shoulder pain ? ? ?INTERVAL HISTORY ?Benjamin Robles is a 66 y.o. male who has above history reviewed by me today presents for follow up visit to review results. ?Patient has no new complaints.  Patient had blood work done after last visit. ?Patient denies any constitutional symptoms.  During the interval, patient had a EGD and a colonoscopy done. ? ?Review of Systems  ?Constitutional:  Positive for fatigue. Negative for appetite change, chills, fever and unexpected weight change.  ?HENT:   Negative for hearing loss and voice change.   ?Eyes:  Negative for eye problems and icterus.  ?Respiratory:  Negative for chest tightness,  cough and shortness of breath.   ?Cardiovascular:  Negative for chest pain and leg swelling.  ?Gastrointestinal:  Negative for abdominal distention and abdominal pain.  ?Endocrine: Negative for hot flashes.  ?Genitourinary:  Negative for difficulty urinating, dysuria and frequency.   ?Musculoskeletal:  Positive for arthralgias.  ?Skin:  Negative for itching and rash.  ?Neurological:  Negative for light-headedness and numbness.  ?Hematological:  Negative for adenopathy. Does not bruise/bleed easily.  ?Psychiatric/Behavioral:  Negative for confusion.   ? ?MEDICAL HISTORY:  ?Past Medical History:  ?Diagnosis Date  ? Allergy   ? SINUS ALLEGIES  ? Anticoagulant disorder ([place])   ? Anxiety   ? BPH (benign prostatic hypertrophy)   ? mild  ? Bradycardia   ? Cancer [facility])   ? CLL (chronic lymphocytic leukemia) ([place])   ? Depression   ? GERD (gastroesophageal reflux disease)   ? History of colon polyps   ? normal colonoscopy 2012  ? Pain of right sternoclavicular joint   ? 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 Umbilical hernia   ? ? ?SURGICAL HISTORY: ?Past Surgical History:  ?Procedure Laterality Date  ? COLONOSCOPY  08/07/2011  ? WNL, rpt 10 yrs  ? HERNIA REPAIR    ? x2  ? UPPER GASTROINTESTINAL ENDOSCOPY  08/07/2011  ? chronic superficial gastritis, oozing nodule clipped, neg H pylori  ? ? ?SOCIAL HISTORY: ?Social History  ? ?Socioeconomic History  ? Marital status: Married  ?  Spouse name: Not on file  ? Number of children: 2  ? Years of education: Not on file  ? Highest education level:  Not on file  ?Occupational History  ? Occupation: Tech support  ?  Employer: [employer]  ?Tobacco Use  ? Smoking status: Former  ?  Packs/day: 0.50  ?  Years: 7.00  ?  Pack years: 3.50  ?  Types: Cigarettes  ?  Quit date: 53  ?  Years since quitting: 40.4  ? Smokeless tobacco: Never  ?Substance and Sexual Activity  ? Alcohol use: Yes  ?  Comment: 1 beer/day  ? Drug use: No  ? Sexual activity: Yes  ?Other Topics Concern  ? Not on  file  ?Social History Narrative  ? "Will"  ? Lives with wife, 2 cats and 2 dogs, grown children  ? Occupation: Air cabin crew support  ? Activity: no regular exercise routine  ? Diet: good water, fruits/vegetables daily  ? ?Social Determinants of Health  ? ?Financial Resource Strain: Not on file  ?Food Insecurity: Not on file  ?Transportation Needs: Not on file  ?Physical Activity: Not on file  ?Stress: Not on file  ?Social Connections: Not on file  ?Intimate Partner Violence: Not on file  ? ? ?FAMILY HISTORY: ?Family History  ?Problem Relation Age of Onset  ? Hypertension Mother   ? Depression Father   ? Prostate cancer Father 65  ?     slow growing  ? CAD Neg Hx   ? Stroke Neg Hx   ? Diabetes Neg Hx   ? Colon cancer Neg Hx   ? Rectal cancer Neg Hx   ? Stomach cancer Neg Hx   ? ? ?ALLERGIES:  is allergic to amoxicillin-pot clavulanate. ? ?MEDICATIONS:  ?Current Outpatient Medications  ?Medication Sig Dispense Refill  ? calcium carbonate (TUMS - DOSED IN MG ELEMENTAL CALCIUM) 500 MG chewable tablet Chew 1 tablet by mouth as needed.      ? Cholecalciferol (VITAMIN D3 PO) Take 1 capsule by mouth daily.    ? cyanocobalamin (V-R VITAMIN B-12) 500 MCG tablet Take 1 tablet (500 mcg total) by mouth daily.    ? esomeprazole (NEXIUM) 20 MG capsule Take 1 capsule (20 mg total) by mouth daily at 12 noon.    ? fluticasone (FLONASE) 50 MCG/ACT nasal spray USE 2 SPRAYS NASALY DAILY 48 g 3  ? hydrOXYzine (ATARAX/VISTARIL) 25 MG tablet TAKE 1/2-1 TABLET BY MOUTH TWO TIMES A DAY AS NEEDED FOR ANXIETY (SEDATION PRECAUTIONS) 30 tablet 1  ? magnesium 30 MG tablet Take 30 mg by mouth daily.    ? multivitamin (THERAGRAN) per tablet Take 1 tablet by mouth daily.      ? psyllium (METAMUCIL) 58.6 % powder Take 1 packet by mouth daily.     ? sildenafil (VIAGRA) 50 MG tablet TAKE ONE TABLET BY MOUTH DAILY AS NEEDED FOR ERECTILE DYSFUNCTION 10 tablet 3  ? venlafaxine XR (EFFEXOR XR) 37.5 MG 24 hr capsule Take 1 capsule (37.5 mg total) by  mouth daily with breakfast. (Patient not taking: Reported on 01/08/2022) 30 capsule 6  ? ?No current facility-administered medications for this visit.  ? ? ? ?PHYSICAL EXAMINATION: ?ECOG PERFORMANCE STATUS: 0 - Asymptomatic ?Vitals:  ? 01/08/22 1005  ?BP: 131/82  ?Pulse: 66  ?Resp: 18  ?SpO2: 99%  ? ?Filed Weights  ? 01/08/22 0959  ?Weight: 170 lb (77.1 kg)  ? ? ?Physical Exam ?Constitutional:   ?   General: He is not in acute distress. ?HENT:  ?   Head: Normocephalic and atraumatic.  ?Eyes:  ?   General: No scleral icterus. ?Cardiovascular:  ?  Rate and Rhythm: Normal rate and regular rhythm.  ?   Heart sounds: Normal heart sounds.  ?Pulmonary:  ?   Effort: Pulmonary effort is normal. No respiratory distress.  ?   Breath sounds: No wheezing.  ?Abdominal:  ?   General: Bowel sounds are normal. There is no distension.  ?   Palpations: Abdomen is soft.  ?Musculoskeletal:     ?   General: No deformity. Normal range of motion.  ?   Cervical back: Normal range of motion and neck supple.  ?Skin: ?   General: Skin is warm and dry.  ?   Findings: No erythema or rash.  ?Neurological:  ?   Mental Status: He is alert and oriented to person, place, and time. Mental status is at baseline.  ?   Cranial Nerves: No cranial nerve deficit.  ?   Coordination: Coordination normal.  ?Psychiatric:     ?   Mood and Affect: Mood normal.  ? ? ?LABORATORY DATA:  ?I have reviewed the data as listed ?Lab Results  ?Component Value Date  ? WBC 15.1 (H) 12/24/2021  ? HGB 15.4 12/24/2021  ? HCT 45.2 12/24/2021  ? MCV 89.3 12/24/2021  ? PLT 319 12/24/2021  ? ?Recent Labs  ?  04/24/21 ?1233 11/19/21 ?0734 12/04/21 ?1252 12/04/21 ?1257 12/24/21 ?5329  ?NA 138 136 139  --  135  ?K 4.4 4.7 4.5  --  3.8  ?CL 101 101 103  --  101  ?CO2 '31 28 29  '$ --  28  ?GLUCOSE 93 103* 126*  --  119*  ?BUN '17 12 14  '$ --  13  ?CREATININE 1.15 1.47 1.16  --  1.01  ?CALCIUM 10.1 9.9 9.3  --  9.4  ?GFRNONAA  --   --   --   --  >60  ?PROT 7.3 7.0  --  6.6 7.7  ?ALBUMIN 4.6  4.8  --   --  4.7  ?AST 17 16  --   --  21  ?ALT 14 13  --   --  18  ?ALKPHOS 62 71  --   --  67  ?BILITOT 0.5 0.8  --   --  0.8  ? ? ?Iron/TIBC/Ferritin/ %Sat ?No results found for: IRON, TIBC, FERRITIN, IRON

## 2022-01-09 ENCOUNTER — Telehealth: Payer: Self-pay | Admitting: *Deleted

## 2022-01-09 NOTE — Telephone Encounter (Signed)
  Follow up Call-     01/07/2022    2:30 PM  Call back number  Post procedure Call Back phone  # [phone]  Permission to leave phone message Yes     Patient questions:  Do you have a fever, pain , or abdominal swelling? No. Pain Score  0 *  Have you tolerated food without any problems? Yes.    Have you been able to return to your normal activities? Yes.    Do you have any questions about your discharge instructions: Diet   No. Medications  No. Follow up visit  No.  Do you have questions or concerns about your Care? No.  Actions: * If pain score is 4 or above: No action needed, pain <4.

## 2022-01-09 NOTE — Telephone Encounter (Signed)
First follow up call attempt.  LVM. 

## 2022-01-13 ENCOUNTER — Encounter: Payer: Self-pay | Admitting: Internal Medicine

## 2022-01-22 ENCOUNTER — Ambulatory Visit: Payer: Managed Care, Other (non HMO) | Admitting: Oncology

## 2022-01-27 ENCOUNTER — Ambulatory Visit
Admission: RE | Admit: 2022-01-27 | Discharge: 2022-01-27 | Disposition: A | Discharge status: Home / Self Care | Payer: Medicare Other | Source: Ambulatory Visit | Attending: Oncology | Admitting: Oncology

## 2022-01-27 DIAGNOSIS — C9110 Chronic lymphocytic leukemia of B-cell type not having achieved remission: Secondary | ICD-10-CM | POA: Diagnosis present

## 2022-04-25 ENCOUNTER — Other Ambulatory Visit: Payer: Self-pay

## 2022-04-25 ENCOUNTER — Emergency Department: Payer: Medicare Other

## 2022-04-25 ENCOUNTER — Emergency Department
Admission: EM | Admit: 2022-04-25 | Discharge: 2022-04-25 | Disposition: A | Discharge status: Home / Self Care | Payer: Medicare Other | Attending: Emergency Medicine | Admitting: Emergency Medicine

## 2022-04-25 DIAGNOSIS — M25511 Pain in right shoulder: Secondary | ICD-10-CM | POA: Diagnosis present

## 2022-04-25 DIAGNOSIS — W010XXA Fall on same level from slipping, tripping and stumbling without subsequent striking against object, initial encounter: Secondary | ICD-10-CM | POA: Diagnosis not present

## 2022-04-25 DIAGNOSIS — Z856 Personal history of leukemia: Secondary | ICD-10-CM | POA: Diagnosis not present

## 2022-04-25 MED ORDER — ONDANSETRON 4 MG PO TBDP
4.0000 mg | ORAL_TABLET | Freq: Once | ORAL | Status: AC
  Administered 2022-04-25: 4 mg via ORAL
  Filled 2022-04-25: qty 1

## 2022-04-25 MED ORDER — ONDANSETRON 4 MG PO TBDP: 4.0000 mg | ORAL_TABLET | Freq: Three times a day (TID) | ORAL | 0 refills | Status: AC | PRN

## 2022-04-25 MED ORDER — OXYCODONE-ACETAMINOPHEN 5-325 MG PO TABS: 1.0000 | ORAL_TABLET | Freq: Four times a day (QID) | ORAL | 0 refills | Status: AC | PRN

## 2022-04-25 MED ORDER — OXYCODONE-ACETAMINOPHEN 5-325 MG PO TABS
1.0000 | ORAL_TABLET | Freq: Once | ORAL | Status: AC
  Administered 2022-04-25: 1 via ORAL
  Filled 2022-04-25: qty 1

## 2022-04-25 NOTE — Discharge Instructions (Signed)
You can take Percocet for pain.

## 2022-04-25 NOTE — ED Notes (Signed)
This RN back into room at this time to discuss discharge paperwork; patient and spouse have left with no signs of returning.  Unable to have patient sign for discharge due to this reason.

## 2022-04-25 NOTE — ED Triage Notes (Signed)
Pt states he tripped over dog landing on ground injuring right shoulder. Pt denies other injuries.

## 2022-04-25 NOTE — ED Provider Notes (Signed)
[facility] Provider Note  Patient Contact: 8:45 PM (approximate)   History   Shoulder Injury   HPI  Benjamin Robles is a 66 y.o. male with a history of CLL GERD and hypercholesterolemia, presents to the emergency department after patient fell on his right shoulder.  Patient reports that he was out on a walk with his wife when his dog tripped him.  Patient denies hitting his head or his neck.  Patient denies pain other than his shoulder.  No neck pain, numbness or tingling in the upper extremities, chest tightness, shortness of breath or abdominal pain.      Physical Exam   Triage Vital Signs: ED Triage Vitals  Enc Vitals Group     BP 04/25/22 2017 130/77     Pulse Rate 04/25/22 2017 67     Resp 04/25/22 2017 16     Temp 04/25/22 2017 98.6 F (37 C)     Temp Source 04/25/22 2017 Oral     SpO2 04/25/22 2017 100 %     Weight 04/25/22 2018 170 lb (77.1 kg)     Height 04/25/22 2018 '6\' 1"'$  (1.854 m)     Head Circumference --      Peak Flow --      Pain Score 04/25/22 2017 3     Pain Loc --      Pain Edu? --      Excl. in [place]? --     Most recent vital signs: Vitals:   04/25/22 2017  BP: 130/77  Pulse: 67  Resp: 16  Temp: 98.6 F (37 C)  SpO2: 100%     General: Alert and in no acute distress. Eyes:  PERRL. EOMI. Head: No acute traumatic findings ENT:      Nose: No congestion/rhinnorhea.      Mouth/Throat: Mucous membranes are moist.  Neck: No stridor. No cervical spine tenderness to palpation. Cardiovascular:  Good peripheral perfusion Respiratory: Normal respiratory effort without tachypnea or retractions. Lungs CTAB. Good air entry to the bases with no decreased or absent breath sounds. Gastrointestinal: Bowel sounds 4 quadrants. Soft and nontender to palpation. No guarding or rigidity. No palpable masses. No distention. No CVA tenderness. Musculoskeletal: Performs limited range of motion of the right shoulder due to pain.  Full range of  motion to all extremities.  Palpable radial and ulnar pulses bilaterally and symmetrically. Neurologic:  No gross focal neurologic deficits are appreciated.  Skin:   No rash noted Other:   ED Results / Procedures / Treatments   Labs (all labs ordered are listed, but only abnormal results are displayed) Labs Reviewed - No data to display     RADIOLOGY  I personally viewed and evaluated these images as part of my medical decision making, as well as reviewing the written report by the radiologist.  ED Provider Interpretation: No acute bony abnormality visualized on x-ray of the right shoulder.   PROCEDURES:  Critical Care performed: No  Procedures   MEDICATIONS ORDERED IN ED: Medications  oxyCODONE-acetaminophen (PERCOCET/ROXICET) 5-325 MG per tablet 1 tablet (has no administration in time range)  ondansetron (ZOFRAN-ODT) disintegrating tablet 4 mg (has no administration in time range)     IMPRESSION / MDM / ASSESSMENT AND PLAN / ED COURSE  I reviewed the triage vital signs and the nursing notes.  Assessment and plan:  Fall:  66 year old male presents to the emergency department with acute right shoulder pain after mechanical fall.  Vital signs are reassuring at triage.  On exam, patient was alert, active and nontoxic-appearing.  X-ray of the right shoulder showed no acute bony abnormality.  Will place patient in sling and prescribe him a short course of Percocet for pain.  Recommended follow-up with orthopedics as needed.     FINAL CLINICAL IMPRESSION(S) / ED DIAGNOSES   Final diagnoses:  Pain in joint of right shoulder     Rx / DC Orders   ED Discharge Orders          Ordered    oxyCODONE-acetaminophen (PERCOCET/ROXICET) 5-325 MG tablet  Every 6 hours PRN        04/25/22 2129    ondansetron (ZOFRAN-ODT) 4 MG disintegrating tablet  Every 8 hours PRN        04/25/22 2129             Note:  This document was prepared  using Dragon voice recognition software and may include unintentional dictation errors.   Vallarie Mare Herrick, Hershal Coria 04/25/22 2144    Harvest Dark, MD 04/25/22 2255

## 2022-05-08 ENCOUNTER — Ambulatory Visit (INDEPENDENT_AMBULATORY_CARE_PROVIDER_SITE_OTHER): Payer: Medicare Other | Admitting: Family Medicine

## 2022-05-08 ENCOUNTER — Ambulatory Visit: Payer: Medicare Other

## 2022-05-08 VITALS — BP 150/88 | HR 67 | Ht 73.0 in | Wt 174.0 lb

## 2022-05-08 DIAGNOSIS — M25511 Pain in right shoulder: Secondary | ICD-10-CM

## 2022-05-08 NOTE — Progress Notes (Unsigned)
   I, [name], LAT, ATC acting as a scribe for [name], MD.  Subjective:    CC: R shoulder pain  HPI: Pt is a 66 y/o male c/o R shoulder pain ongoing since 9/1 that he injured in a fall, landing on her R side. Pt was seen at the [facility] ED after the fall w/ this complaint and was placed in a sling. Pt reports he was out walking w/ his wife and their dog tripped him up and he fell onto her R-side. Pt locates pain to varying locations over R shoulder joint and into the R distal clavicle.   Radiates: no Aggravates: R-side lying, IR, end range of shoulder flexion Treatments tried: sling, oxycodone, ice, ROM exercises  Dx imaging: 04/25/22 R shoulder XR  Pertinent review of Systems: No fevers or chills  Relevant historical information: CLL.   Objective:    Vitals:   05/08/22 1423  BP: (!) 150/88  Pulse: 67  SpO2: 99%   General: Well Developed, well nourished, and in no acute distress.   MSK: Right shoulder Normal-appearing Normal motion some pain with abduction. Intact strength. Mildly positive Hawkins and Neer's test. Mildly positive empty can test. Negative Yergason's and speeds test. Pulses capillary refill and sensation are intact distally.  Lab and Radiology Results  Diagnostic Limited MSK Ultrasound of: Right shoulder Biceps tendon intact normal. Subscapularis tendon normal. Supraspinatus tendon intact without tear. Infraspinatus tendon intact without tear. AC joint normal-appearing Impression: Normal-appearing MSK ultrasound examination of the shoulder.  No large retracted rotator cuff tear visible. . DG Shoulder Right  Result Date: 04/25/2022 CLINICAL DATA:  [place] on shoulder shoulder pain EXAM: RIGHT SHOULDER - 2+ VIEW COMPARISON:  06/28/2012 FINDINGS: No fracture or malalignment. Mild AC joint degenerative change. The right apex is clear. IMPRESSION: No acute osseous abnormality. Electronically Signed   By: Donavan Foil M.D.   On:  04/25/2022 21:21   I, [name], personally (independently) visualized and performed the interpretation of the images attached in this note.    Impression and Recommendations:    Assessment and Plan: 66 y.o. male with right shoulder pain after fall.  Fortunately range of motion and strength are well-preserved.  No large retracted rotator cuff tear is visible on ultrasound examination today.  Plan for physical therapy trial and check back in 8 weeks.  His wife has an established relationship at [facility] so we will use that location.Marland Kitchen  PDMP not reviewed this encounter. Orders Placed This Encounter  Procedures   [place] LIMITED JOINT SPACE STRUCTURES UP RIGHT(NO LINKED CHARGES)    Order Specific Question:   Reason for Exam (SYMPTOM  OR DIAGNOSIS REQUIRED)    Answer:   right shoulder pain    Order Specific Question:   Preferred imaging location?    Answer:   [facility]   Ambulatory referral to Physical Therapy    Referral Priority:   Routine    Referral Type:   Physical Medicine    Referral Reason:   Specialty Services Required    Requested Specialty:   Physical Therapy    Number of Visits Requested:   1   No orders of the defined types were placed in this encounter.   Discussed warning signs or symptoms. Please see discharge instructions. Patient expresses understanding.   The above documentation has been reviewed and is accurate and complete [name], M.D.

## 2022-05-08 NOTE — Patient Instructions (Addendum)
Thank you for coming in today.   I've referred you to Physical Therapy.  Let us know if you don't hear from them in one week.   Check back in 8 weeks

## 2022-05-12 ENCOUNTER — Encounter: Payer: Self-pay | Admitting: Oncology

## 2022-05-12 ENCOUNTER — Inpatient Hospital Stay (HOSPITAL_BASED_OUTPATIENT_CLINIC_OR_DEPARTMENT_OTHER): Payer: Medicare Other | Admitting: Oncology

## 2022-05-12 ENCOUNTER — Inpatient Hospital Stay: Payer: Medicare Other | Attending: Oncology

## 2022-05-12 DIAGNOSIS — Z789 Other specified health status: Secondary | ICD-10-CM | POA: Diagnosis not present

## 2022-05-12 DIAGNOSIS — R61 Generalized hyperhidrosis: Secondary | ICD-10-CM | POA: Insufficient documentation

## 2022-05-12 DIAGNOSIS — R5382 Chronic fatigue, unspecified: Secondary | ICD-10-CM | POA: Insufficient documentation

## 2022-05-12 DIAGNOSIS — Z87891 Personal history of nicotine dependence: Secondary | ICD-10-CM | POA: Diagnosis not present

## 2022-05-12 DIAGNOSIS — M25511 Pain in right shoulder: Secondary | ICD-10-CM | POA: Diagnosis not present

## 2022-05-12 DIAGNOSIS — C9110 Chronic lymphocytic leukemia of B-cell type not having achieved remission: Secondary | ICD-10-CM

## 2022-05-12 DIAGNOSIS — Z8042 Family history of malignant neoplasm of prostate: Secondary | ICD-10-CM | POA: Diagnosis not present

## 2022-05-12 DIAGNOSIS — R161 Splenomegaly, not elsewhere classified: Secondary | ICD-10-CM | POA: Diagnosis not present

## 2022-05-12 LAB — CBC WITH DIFFERENTIAL/PLATELET
Abs Immature Granulocytes: 0.02 10*3/uL (ref 0.00–0.07)
Basophils Absolute: 0.1 10*3/uL (ref 0.0–0.1)
Basophils Relative: 0 %
Eosinophils Absolute: 0.1 10*3/uL (ref 0.0–0.5)
Eosinophils Relative: 1 %
HCT: 42.8 % (ref 39.0–52.0)
Hemoglobin: 14.2 g/dL (ref 13.0–17.0)
Immature Granulocytes: 0 %
Lymphocytes Relative: 54 %
Lymphs Abs: 7.8 10*3/uL — ABNORMAL HIGH (ref 0.7–4.0)
MCH: 30.6 pg (ref 26.0–34.0)
MCHC: 33.2 g/dL (ref 30.0–36.0)
MCV: 92.2 fL (ref 80.0–100.0)
Monocytes Absolute: 0.6 10*3/uL (ref 0.1–1.0)
Monocytes Relative: 4 %
Neutro Abs: 6 10*3/uL (ref 1.7–7.7)
Neutrophils Relative %: 41 %
Platelets: 286 10*3/uL (ref 150–400)
RBC: 4.64 MIL/uL (ref 4.22–5.81)
RDW: 12.6 % (ref 11.5–15.5)
Smear Review: NORMAL
WBC: 14.6 10*3/uL — ABNORMAL HIGH (ref 4.0–10.5)
nRBC: 0 % (ref 0.0–0.2)

## 2022-05-12 LAB — COMPREHENSIVE METABOLIC PANEL
ALT: 20 U/L (ref 0–44)
AST: 20 U/L (ref 15–41)
Albumin: 4.5 g/dL (ref 3.5–5.0)
Alkaline Phosphatase: 72 U/L (ref 38–126)
Anion gap: 7 (ref 5–15)
BUN: 21 mg/dL (ref 8–23)
CO2: 28 mmol/L (ref 22–32)
Calcium: 8.9 mg/dL (ref 8.9–10.3)
Chloride: 103 mmol/L (ref 98–111)
Creatinine, Ser: 1.02 mg/dL (ref 0.61–1.24)
GFR, Estimated: >60 mL/min (ref >60)
Glucose, Bld: 145 mg/dL — ABNORMAL HIGH (ref 70–99)
Potassium: 4.3 mmol/L (ref 3.5–5.1)
Sodium: 138 mmol/L (ref 135–145)
Total Bilirubin: 0.7 mg/dL (ref 0.3–1.2)
Total Protein: 7.3 g/dL (ref 6.5–8.1)

## 2022-05-12 LAB — LACTATE DEHYDROGENASE: LDH: 107 U/L (ref 98–192)

## 2022-05-12 NOTE — Assessment & Plan Note (Signed)
Mild splenomegaly.  Monitor.

## 2022-05-12 NOTE — Progress Notes (Signed)
Hematology/Oncology Progress note [phone] [phone]            Patient Care Team: Ria Bush, MD as PCP - General  ASSESSMENT & PLAN:   CLL (chronic lymphocytic leukemia) ([facility]) 12/24/2021, peripheral blood flow cytometry is positive for chronic lymphocytic leukemia, positive for CD19, CD20 and CD22 and negative for CD38. No significant constitutional symptoms.  Continue watchful waiting.  Check IGVH and CLL FISH  Alcohol use Recommend alcohol cessation  Splenomegaly Mild splenomegaly.  Monitor.   Orders Placed This Encounter  Procedures   CBC with Differential/Platelet    Standing Status:   Future    Standing Expiration Date:   05/13/2023   Comprehensive metabolic panel    Standing Status:   Future    Standing Expiration Date:   05/12/2023   Lactate dehydrogenase    Standing Status:   Future    Standing Expiration Date:   05/13/2023   FISH HES [id number] REA    Standing Status:   Future    Standing Expiration Date:   05/13/2023   Follow up in 6 months.  All questions were answered. The patient knows to call the clinic with any problems, questions or concerns.  Earlie Server, MD, PhD [facility] Hematology Oncology 05/12/2022   Ria Bush, MD  CHIEF COMPLAINTS/REASON FOR VISIT:  Follow-up for CLL  HISTORY OF PRESENTING ILLNESS:  66 y.o.male presents for CLL.  Oncology History  CLL (chronic lymphocytic leukemia) ([place])  01/08/2022 Initial Diagnosis   CLL (chronic lymphocytic leukemia) ([place])   01/08/2022 Cancer Staging   Staging form: Chronic Lymphocytic Leukemia / Small Lymphocytic Lymphoma, AJCC 8th Edition - Clinical: Modified Rai Stage II (Modified Rai risk: Intermediate, Lymphocytosis: Present, Adenopathy: Absent, Organomegaly: Present, Anemia: Absent, Thrombocytopenia: Absent) - Signed by Earlie Server, MD on 05/12/2022 Stage prefix: Initial diagnosis    01/27/2022 Imaging   US abdomen complete showed Borderline size of the  spleen.      Former smoker, he stopped in the 1980s.  He drinks 1 beer per day.  Recently has tried to cut back on alcohol consumption. Patient has some chronic fatigue which is unchanged. + Arthralgia-shoulder pain   INTERVAL HISTORY Benjamin Robles is a 66 y.o. male who has above history reviewed by me today presents for follow up visit to review results. Patient has no new complaints.  He is accompanied by sweating at night.  No fever. Appetite is good.   Review of Systems  Constitutional:  Positive for fatigue. Negative for appetite change, chills, fever and unexpected weight change.  HENT:   Negative for hearing loss and voice change.   Eyes:  Negative for eye problems and icterus.  Respiratory:  Negative for chest tightness, cough and shortness of breath.   Cardiovascular:  Negative for chest pain and leg swelling.  Gastrointestinal:  Negative for abdominal distention and abdominal pain.  Endocrine: Negative for hot flashes.  Genitourinary:  Negative for difficulty urinating, dysuria and frequency.   Musculoskeletal:  Positive for arthralgias.  Skin:  Negative for itching and rash.  Neurological:  Negative for light-headedness and numbness.  Hematological:  Negative for adenopathy. Does not bruise/bleed easily.  Psychiatric/Behavioral:  Negative for confusion.     MEDICAL HISTORY:  Past Medical History:  Diagnosis Date   Allergy    SINUS ALLEGIES   Anticoagulant disorder ([place])    Anxiety    BPH (benign prostatic hypertrophy)    mild   Bradycardia    Cancer (HCC)    CLL (  chronic lymphocytic leukemia) (HCC)    Depression    GERD (gastroesophageal reflux disease)    History of colon polyps    normal colonoscopy 2012   Pain of right sternoclavicular joint    MRI 06/2012 showing ?erosive arthritis   Umbilical hernia     SURGICAL HISTORY: Past Surgical History:  Procedure Laterality Date   COLONOSCOPY  08/07/2011   WNL, rpt 10 yrs   HERNIA REPAIR     x2   UPPER  GASTROINTESTINAL ENDOSCOPY  08/07/2011   chronic superficial gastritis, oozing nodule clipped, neg H pylori    SOCIAL HISTORY: Social History   Socioeconomic History   Marital status: Married    Spouse name: Not on file   Number of children: 2   Years of education: Not on file   Highest education level: Not on file  Occupational History   Occupation: Database administrator: [employer]  Tobacco Use   Smoking status: Former    Packs/day: 0.50    Years: 7.00    Total pack years: 3.50    Types: Cigarettes    Quit date: 1983    Years since quitting: 40.7   Smokeless tobacco: Never  Substance and Sexual Activity   Alcohol use: Yes    Comment: 1 beer/day   Drug use: No   Sexual activity: Yes  Other Topics Concern   Not on file  Social History Narrative   "Will"   Lives with wife, 2 cats and 2 dogs, grown children   Occupation: Air cabin crew support   Activity: no regular exercise routine   Diet: good water, fruits/vegetables daily   Social Determinants of Health   Financial Resource Strain: Not on file  Food Insecurity: Not on file  Transportation Needs: Not on file  Physical Activity: Not on file  Stress: Not on file  Social Connections: Not on file  Intimate Partner Violence: Not on file    FAMILY HISTORY: Family History  Problem Relation Age of Onset   Hypertension Mother    Depression Father    Prostate cancer Father 60       slow growing   CAD Neg Hx    Stroke Neg Hx    Diabetes Neg Hx    Colon cancer Neg Hx    Rectal cancer Neg Hx    Stomach cancer Neg Hx     ALLERGIES:  is allergic to amoxicillin-pot clavulanate.  MEDICATIONS:  Current Outpatient Medications  Medication Sig Dispense Refill   calcium carbonate (TUMS - DOSED IN MG ELEMENTAL CALCIUM) 500 MG chewable tablet Chew 1 tablet by mouth as needed.       Cholecalciferol (VITAMIN D3 PO) Take 1 capsule by mouth daily.     cyanocobalamin (V-R VITAMIN B-12) 500 MCG tablet Take 1 tablet  (500 mcg total) by mouth daily.     esomeprazole (NEXIUM) 20 MG capsule Take 1 capsule (20 mg total) by mouth daily at 12 noon.     fluticasone (FLONASE) 50 MCG/ACT nasal spray USE 2 SPRAYS NASALY DAILY 48 g 3   hydrOXYzine (ATARAX/VISTARIL) 25 MG tablet TAKE 1/2-1 TABLET BY MOUTH TWO TIMES A DAY AS NEEDED FOR ANXIETY (SEDATION PRECAUTIONS) 30 tablet 1   magnesium 30 MG tablet Take 30 mg by mouth daily.     multivitamin (THERAGRAN) per tablet Take 1 tablet by mouth daily.       psyllium (METAMUCIL) 58.6 % powder Take 1 packet by mouth daily.      sildenafil (  VIAGRA) 50 MG tablet TAKE ONE TABLET BY MOUTH DAILY AS NEEDED FOR ERECTILE DYSFUNCTION 10 tablet 3   venlafaxine XR (EFFEXOR XR) 37.5 MG 24 hr capsule Take 1 capsule (37.5 mg total) by mouth daily with breakfast. 30 capsule 6   No current facility-administered medications for this visit.     PHYSICAL EXAMINATION: ECOG PERFORMANCE STATUS: 0 - Asymptomatic Vitals:   05/12/22 1322  BP: (!) 145/80  Pulse: 62  Resp: 18  Temp: 98 F (36.7 C)   Filed Weights   05/12/22 1322  Weight: 174 lb 6.4 oz (79.1 kg)    Physical Exam Constitutional:      General: He is not in acute distress. HENT:     Head: Normocephalic and atraumatic.  Eyes:     General: No scleral icterus. Cardiovascular:     Rate and Rhythm: Normal rate.  Pulmonary:     Effort: Pulmonary effort is normal. No respiratory distress.     Breath sounds: No wheezing.  Abdominal:     General: Bowel sounds are normal. There is no distension.     Palpations: Abdomen is soft.  Musculoskeletal:        General: No deformity. Normal range of motion.     Cervical back: Normal range of motion.  Skin:    General: Skin is warm and dry.  Neurological:     Mental Status: He is alert and oriented to person, place, and time. Mental status is at baseline.     Cranial Nerves: No cranial nerve deficit.  Psychiatric:        Mood and Affect: Mood normal.     LABORATORY DATA:  I  have reviewed the data as listed    Latest Ref Rng & Units 05/12/2022    1:05 PM 12/24/2021    9:56 AM 12/04/2021   12:57 PM  CBC  WBC 4.0 - 10.5 K/uL 14.6  15.1  13.3   Hemoglobin 13.0 - 17.0 g/dL 14.2  15.4  14.0   Hematocrit 39.0 - 52.0 % 42.8  45.2  41.2   Platelets 150 - 400 K/uL 286  319  301       Latest Ref Rng & Units 05/12/2022    1:05 PM 12/24/2021    9:56 AM 12/04/2021   12:57 PM  CMP  Glucose 70 - 99 mg/dL 145  119    BUN 8 - 23 mg/dL 21  13    Creatinine 0.61 - 1.24 mg/dL 1.02  1.01    Sodium 135 - 145 mmol/L 138  135    Potassium 3.5 - 5.1 mmol/L 4.3  3.8    Chloride 98 - 111 mmol/L 103  101    CO2 22 - 32 mmol/L 28  28    Calcium 8.9 - 10.3 mg/dL 8.9  9.4    Total Protein 6.5 - 8.1 g/dL 7.3  7.7  6.6   Total Bilirubin 0.3 - 1.2 mg/dL 0.7  0.8    Alkaline Phos 38 - 126 U/L 72  67    AST 15 - 41 U/L 20  21    ALT 0 - 44 U/L 20  18    ;  RADIOGRAPHIC STUDIES: I have personally reviewed the radiological images as listed and agreed with the findings in the report. DG Shoulder Right  Result Date: 04/25/2022 CLINICAL DATA:  [place] on shoulder shoulder pain EXAM: RIGHT SHOULDER - 2+ VIEW COMPARISON:  06/28/2012 FINDINGS: No fracture or malalignment. Mild AC joint degenerative change. The  right apex is clear. IMPRESSION: No acute osseous abnormality. Electronically Signed   By: Donavan Foil M.D.   On: 04/25/2022 21:21

## 2022-05-12 NOTE — Assessment & Plan Note (Addendum)
12/24/2021, peripheral blood flow cytometry is positive for chronic lymphocytic leukemia, positive for CD19, CD20 and CD22 and negative for CD38. No significant constitutional symptoms.  Continue watchful waiting.  Check IGVH and CLL FISH

## 2022-05-12 NOTE — Assessment & Plan Note (Signed)
Recommend alcohol cessation. 

## 2022-05-12 NOTE — Progress Notes (Signed)
Pt here for follow up. No new concerns voiced.   

## 2022-05-21 LAB — MISC LABCORP TEST (SEND OUT): Labcorp test code: 113753

## 2022-07-03 ENCOUNTER — Ambulatory Visit (INDEPENDENT_AMBULATORY_CARE_PROVIDER_SITE_OTHER): Payer: Medicare Other

## 2022-07-03 ENCOUNTER — Ambulatory Visit: Payer: Medicare Other

## 2022-07-03 ENCOUNTER — Ambulatory Visit (INDEPENDENT_AMBULATORY_CARE_PROVIDER_SITE_OTHER): Payer: Medicare Other | Admitting: Family Medicine

## 2022-07-03 VITALS — BP 128/82 | HR 67 | Ht 73.0 in | Wt 174.0 lb

## 2022-07-03 DIAGNOSIS — M545 Low back pain, unspecified: Secondary | ICD-10-CM | POA: Diagnosis not present

## 2022-07-03 DIAGNOSIS — G8929 Other chronic pain: Secondary | ICD-10-CM | POA: Diagnosis not present

## 2022-07-03 DIAGNOSIS — M79661 Pain in right lower leg: Secondary | ICD-10-CM

## 2022-07-03 DIAGNOSIS — M25511 Pain in right shoulder: Secondary | ICD-10-CM | POA: Diagnosis not present

## 2022-07-03 NOTE — Progress Notes (Signed)
I, Benjamin Robles, LAT, ATC acting as a scribe for Benjamin Leader, MD.  Benjamin Robles is a 67 y.o. male who presents to [place] [facility] today for f/u R shoulder pain. Pt injured his R shoulder on 9/1 in a fall, while walking his dogs, getting tripped up in their leash, landing on his R side. Pt was last seen by Dr. Georgina Robles on 05/08/22 and was referred to [facility] in [facility]. Today, pt reports R shoulder still pretty painful. Pt notes he has HEP that PT gave him, but he had a month between his 1st and 2nd visit.   Pt also c/o L lower leg pain 2 days ago. Pt was using a post hole digger and he bumped into her L lower leg.  Pt would also like to discuss his LBP. Pt locates pain to both sides of his low back intermittently. No radiating pain. No numbness or weakness.   Dx imaging: 04/25/22 R shoulder XR   Pertinent review of systems: No fevers or chills  Relevant historical information: CLL   Exam:  BP 128/82   Pulse 67   Ht '6\' 1"'$  (1.854 m)   Wt 174 lb (78.9 kg)   BMI 22.96 kg/m  General: Well Developed, well nourished, and in no acute distress.   MSK: Right shoulder: Normal. Range of motion abduction 130 degrees.  Otherwise range of motion is intact.  Intact strength.  Positive Hawkins and Neer's test.  L-spine nontender midline decreased lumbar motion.  Lower extremity strength is intact.  Left lower leg contusion proximal anterior medial tibia.  Mildly tender palpation in this region.  Normal knee motion and strength.  Stable ligamentous exam.    Lab and Radiology Results  X-ray images L-spine obtained today personally and independently interpreted No acute fracture.  Mild degenerative changes at T12-L1.  Facet DJD L5-S1 is present. Await formal radiology review     Assessment and Plan: 65 y.o. male with chronic right shoulder pain, chronic low back pain, and left tibia contusion.  Chronic right shoulder pain.  He has had 1 physical therapy  session and at least 6 weeks of home exercise program since his last visit.  Unfortunately he has not had the physical therapy that I ordered nor that the physical therapist themselves requested due to some scheduling and availability on physical therapy side it seems.  He is currently only scheduled for every other week physical therapy which I do not think is very acceptable.  We will try different physical therapy location and see if we can get him a little more frequent physical therapy which I think will be more effective.  Check back in 6 weeks.  Chronic low back pain thought to be primarily due to degenerative changes and core weakness and lumbar paraspinal muscle dysfunction.  Plan for physical therapy referral.  Tibia contusion left tibia.  Doubtful for fracture based on appearance.  He functionally is doing quite well.  Watchful waiting with compression and ice.  Recheck 6 weeks.   PDMP not reviewed this encounter. Orders Placed This Encounter  Procedures   DG Lumbar Spine 2-3 Views    Standing Status:   Future    Number of Occurrences:   1    Standing Expiration Date:   07/04/2023    Order Specific Question:   Reason for Exam (SYMPTOM  OR DIAGNOSIS REQUIRED)    Answer:   eval chronic low back pain    Order Specific Question:  Preferred imaging location?    Answer:   [name]   Ambulatory referral to Physical Therapy    Referral Priority:   Routine    Referral Type:   Physical Medicine    Referral Reason:   Specialty Services Required    Requested Specialty:   Physical Therapy    Number of Visits Requested:   1   No orders of the defined types were placed in this encounter.    Discussed warning signs or symptoms. Please see discharge instructions. Patient expresses understanding.   The above documentation has been reviewed and is accurate and complete Benjamin Robles, M.D.

## 2022-07-03 NOTE — Patient Instructions (Signed)
Thank you for coming in today.   I am referring to [facility].  You should hear from them soon.   In the meantime ok to continue to [facility] while we are waiting.  Typically PT is 1-2x a week.   Please get an Xray today before you leave   Recheck in about 4-6 weeks.

## 2022-07-07 NOTE — Progress Notes (Signed)
Lumbar spine x-ray looks good.  No severe arthritis.  No fractures visible.

## 2022-07-10 ENCOUNTER — Encounter: Payer: Self-pay | Admitting: Family Medicine

## 2022-07-31 ENCOUNTER — Other Ambulatory Visit: Payer: Self-pay | Admitting: Family Medicine

## 2022-07-31 DIAGNOSIS — N529 Male erectile dysfunction, unspecified: Secondary | ICD-10-CM

## 2022-08-04 NOTE — Progress Notes (Signed)
I, Philbert Riser, LAT, ATC acting as a scribe for Clementeen Graham, MD.  Benjamin Robles is a 66 y.o. male who presents to [facility] at [facility] today for f/u R shoulder pain, LBP, and L tibia contusion. Pt injured his R shoulder on 9/1 in a fall, while walking his dogs, getting tripped up in their leash, landing on his R side. Pt was last seen by Dr. Denyse Amass on 07/03/22 and new PT order was placed for a different location, to [facility]. Pt was also advised to use compression and ice on the contusion. Today, pt reports R shoulder pain has improved, but will still have pain w/ certain movements. Pt feels like his low back is feeling better, and PT has been focusing mostly on his R shoulder.   Dx imaging: 07/03/22 L-spine XR 04/25/22 R shoulder XR  06/28/12 R clavicle XR  Pertinent review of systems: No fevers or chills  Relevant historical information: CLL.   Exam:  BP 126/82   Pulse (!) 58   Ht 6\' 1"  (1.854 m)   Wt 177 lb (80.3 kg)   SpO2 100%   BMI 23.35 kg/m  General: Well Developed, well nourished, and in no acute distress.   MSK: Right shoulder normal appearing Nontender. Normal motion pain with abduction. Positive Hawkins and Neer's test.  Positive empty can test. Negative Yergason's and speeds test. Intact strength.    Lab and Radiology Results  Procedure: Real-time Ultrasound Guided Injection of right shoulder subacromial bursa Device: Philips Affiniti 50G Images permanently stored and available for review in PACS Ultrasound evaluation prior to injection shows intact rotator cuff tendons with mild subacromial bursitis Verbal informed consent obtained.  Discussed risks and benefits of procedure. Warned about infection, bleeding, hyperglycemia damage to structures among others. Patient expresses understanding and agreement Time-out conducted.   Noted no overlying erythema, induration, or other signs of local infection.   Skin prepped in a sterile fashion.    Local anesthesia: Topical Ethyl chloride.   With sterile technique and under real time ultrasound guidance: 40 mg of Kenalog and 2 mL of Marcaine injected into subacromial bursa. Fluid seen entering the bursa.   Completed without difficulty   Pain moderately  resolved suggesting accurate placement of the medication.   Advised to call if fevers/chills, erythema, induration, drainage, or persistent bleeding.   Images permanently stored and available for review in the ultrasound unit.  Impression: Technically successful ultrasound guided injection.    EXAM: RIGHT SHOULDER - 2+ VIEW   COMPARISON:  06/28/2012   FINDINGS: No fracture or malalignment. Mild AC joint degenerative change. The right apex is clear.   IMPRESSION: No acute osseous abnormality.     Electronically Signed   By: Jasmine Pang M.D.   On: 04/25/2022 21:21  I, Clementeen Graham, personally (independently) visualized and performed the interpretation of the images attached in this note.    Assessment and Plan: 66 y.o. male with right shoulder pain due to due to impingement and bursitis.  Improving with physical therapy but not fully resolved.  Plan to continue physical therapy but proceed with subacromial injection today.  Check back as needed.   PDMP not reviewed this encounter. Orders Placed This Encounter  Procedures   [place] LIMITED JOINT SPACE STRUCTURES UP RIGHT(NO LINKED CHARGES)    Order Specific Question:   Reason for Exam (SYMPTOM  OR DIAGNOSIS REQUIRED)    Answer:   right shoulder pain    Order Specific Question:  Preferred imaging location?    Answer:   [facility]   No orders of the defined types were placed in this encounter.    Discussed warning signs or symptoms. Please see discharge instructions. Patient expresses understanding.   The above documentation has been reviewed and is accurate and complete Clementeen Graham, M.D.

## 2022-08-05 ENCOUNTER — Ambulatory Visit: Payer: Medicare Other

## 2022-08-05 ENCOUNTER — Ambulatory Visit (INDEPENDENT_AMBULATORY_CARE_PROVIDER_SITE_OTHER): Payer: Medicare Other | Admitting: Family Medicine

## 2022-08-05 VITALS — BP 126/82 | HR 58 | Ht 73.0 in | Wt 177.0 lb

## 2022-08-05 DIAGNOSIS — M25511 Pain in right shoulder: Secondary | ICD-10-CM | POA: Diagnosis not present

## 2022-08-05 NOTE — Patient Instructions (Signed)
Thank you for coming in today.   Continue PT for now.   Call or go to the ER if you develop a large red swollen joint with extreme pain or oozing puss.    Recheck as needed.

## 2022-08-09 ENCOUNTER — Other Ambulatory Visit: Payer: Self-pay | Admitting: Family Medicine

## 2022-08-28 DIAGNOSIS — M25511 Pain in right shoulder: Secondary | ICD-10-CM | POA: Diagnosis not present

## 2022-08-28 DIAGNOSIS — M5459 Other low back pain: Secondary | ICD-10-CM | POA: Diagnosis not present

## 2022-09-01 ENCOUNTER — Ambulatory Visit
Admission: EM | Admit: 2022-09-01 | Discharge: 2022-09-01 | Disposition: A | Discharge status: Home / Self Care | Payer: Medicare Other | Attending: Emergency Medicine | Admitting: Emergency Medicine

## 2022-09-01 DIAGNOSIS — J0100 Acute maxillary sinusitis, unspecified: Secondary | ICD-10-CM | POA: Diagnosis not present

## 2022-09-01 DIAGNOSIS — J209 Acute bronchitis, unspecified: Secondary | ICD-10-CM | POA: Diagnosis not present

## 2022-09-01 MED ORDER — PREDNISONE 10 MG (21) PO TBPK: ORAL_TABLET | Freq: Every day | ORAL | 0 refills | Status: DC

## 2022-09-01 MED ORDER — AZITHROMYCIN 250 MG PO TABS
250.0000 mg | ORAL_TABLET | Freq: Every day | ORAL | 0 refills | Status: DC
Start: 2022-09-01 — End: 2022-09-17

## 2022-09-01 NOTE — Discharge Instructions (Addendum)
Take the Zithromax and prednisone as directed.  Follow up with your primary care provider if your symptoms are not improving.

## 2022-09-01 NOTE — ED Triage Notes (Addendum)
Patient to Urgent Care with complaints of cough and chest congestion x10 days. Reports being sick with a cold over a week ago and symptoms have been lingering.   Denies any known fevers, has been taking his temperature. Describes cough as dry, was able to produce some yellow/ green mucus this morning.  Has been taking mucinex/ nyquil/ daily flonase.

## 2022-09-01 NOTE — ED Provider Notes (Signed)
Benjamin Robles    CSN: [id number] Arrival date & time: 09/01/22  0856      History   Chief Complaint Chief Complaint  Patient presents with   Cough   Nasal Congestion    HPI Benjamin Robles is a 67 y.o. male.  Patient presents with 10-day history of chest congestion and cough.  His cough is occasionally productive.  No fever, earache, sore throat, chest pain, shortness of breath, vomiting, diarrhea, or other symptoms.  Treatment at home with OTC cold medications.  His medical history includes chronic lymphocytic leukemia, GERD, IBS, BPH.  The history is provided by the patient and medical records.    Past Medical History:  Diagnosis Date   Allergy    SINUS ALLEGIES   Anticoagulant disorder (HCC)    Anxiety    BPH (benign prostatic hypertrophy)    mild   Bradycardia    Cancer (HCC)    CLL (chronic lymphocytic leukemia) (HCC)    Depression    GERD (gastroesophageal reflux disease)    History of colon polyps    normal colonoscopy 2012   Pain of right sternoclavicular joint    MRI 06/2012 showing ?erosive arthritis   Umbilical hernia     Patient Active Problem List   Diagnosis Date Noted   Splenomegaly 05/12/2022   Goals of care, counseling/discussion 01/08/2022   Alcohol use 01/08/2022   CLL (chronic lymphocytic leukemia) ([place]) 01/08/2022   Lymphocytosis 12/04/2021   Renal insufficiency 12/04/2021   Abscess of back 11/19/2021   Low serum vitamin B12 11/14/2021   Skin lesion 11/14/2021   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   Benign prostatic hyperplasia    Encounter for general adult medical examination with abnormal findings 07/13/2012   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   TINNITUS, CHRONIC 09/30/2010   IRRITABLE BOWEL SYNDROME 03/29/2010   Pure hypercholesterolemia 08/10/2007   Adjustment disorder with mixed anxiety and depressed mood 05/12/2007   Allergic rhinitis 05/03/2007   GERD (gastroesophageal reflux disease) 05/03/2007    Past  Surgical History:  Procedure Laterality Date   COLONOSCOPY  08/07/2011   WNL, rpt 10 yrs   HERNIA REPAIR     x2   UPPER GASTROINTESTINAL ENDOSCOPY  08/07/2011   chronic superficial gastritis, oozing nodule clipped, neg H pylori       Home Medications    Prior to Admission medications   Medication Sig Start Date End Date Taking? Authorizing Provider  azithromycin (ZITHROMAX) 250 MG tablet Take 1 tablet (250 mg total) by mouth daily. Take first 2 tablets together, then 1 every day until finished. 09/01/22  Yes Sharion Balloon, NP  predniSONE (STERAPRED UNI-PAK 21 TAB) 10 MG (21) TBPK tablet Take by mouth daily. As directed 09/01/22  Yes Sharion Balloon, NP  calcium carbonate (TUMS - DOSED IN MG ELEMENTAL CALCIUM) 500 MG chewable tablet Chew 1 tablet by mouth as needed.      [provider]  Cholecalciferol (VITAMIN D3 PO) Take 1 capsule by mouth daily.    [provider]  cyanocobalamin (V-R VITAMIN B-12) 500 MCG tablet Take 1 tablet (500 mcg total) by mouth daily. 01/02/17   Ria Bush, MD  esomeprazole (NEXIUM) 20 MG capsule Take 1 capsule (20 mg total) by mouth daily at 12 noon. 12/21/19   Ria Bush, MD  fluticasone [facility]) 50 MCG/ACT nasal spray USE 2 SPRAYS NASALY DAILY 12/14/14   Ria Bush, MD  hydrOXYzine (ATARAX/VISTARIL) 25 MG tablet TAKE 1/2-1 TABLET BY MOUTH TWO TIMES A  DAY AS NEEDED FOR ANXIETY (SEDATION PRECAUTIONS) 06/26/21   Ria Bush, MD  magnesium 30 MG tablet Take 30 mg by mouth daily.    [provider]  multivitamin [facility]) per tablet Take 1 tablet by mouth daily.      [provider]  psyllium (METAMUCIL) 58.6 % powder Take 1 packet by mouth daily.     [provider]  sildenafil (VIAGRA) 50 MG tablet TAKE ONE TABLET BY MOUTH DAILY AS NEEDED FOR ERECTILE DYSFUNCTION 08/01/22   Ria Bush, MD    Family History Family History  Problem Relation Age of Onset   Hypertension Mother     Depression Father    Prostate cancer Father 77       slow growing   CAD Neg Hx    Stroke Neg Hx    Diabetes Neg Hx    Colon cancer Neg Hx    Rectal cancer Neg Hx    Stomach cancer Neg Hx     Social History Social History   Tobacco Use   Smoking status: Former    Packs/day: 0.50    Years: 7.00    Total pack years: 3.50    Types: Cigarettes    Quit date: 1983    Years since quitting: 41.0   Smokeless tobacco: Never  Substance Use Topics   Alcohol use: Yes    Comment: 1 beer/day   Drug use: No     Allergies   Amoxicillin-pot clavulanate   Review of Systems Review of Systems  Constitutional:  Negative for chills and fever.  HENT:  Positive for congestion. Negative for ear pain and sore throat.   Respiratory:  Positive for cough. Negative for shortness of breath.   Cardiovascular:  Negative for chest pain and palpitations.  Gastrointestinal:  Negative for diarrhea and vomiting.  Skin:  Negative for color change and rash.  All other systems reviewed and are negative.    Physical Exam Triage Vital Signs ED Triage Vitals  Enc Vitals Group     BP 09/01/22 0919 133/86     Pulse Rate 09/01/22 0910 74     Resp 09/01/22 0910 18     Temp 09/01/22 0910 98.4 F (36.9 C)     Temp src --      SpO2 09/01/22 0910 98 %     Weight --      Height 09/01/22 0917 '6\' 1"'$  (1.854 m)     Head Circumference --      Peak Flow --      Pain Score 09/01/22 0911 3     Pain Loc --      Pain Edu? --      Excl. in [place]? --    No data found.  Updated Vital Signs BP 133/86   Pulse 74   Temp 98.4 F (36.9 C)   Resp 18   Ht '6\' 1"'$  (1.854 m)   SpO2 98%   BMI 23.35 kg/m   Visual Acuity Right Eye Distance:   Left Eye Distance:   Bilateral Distance:    Right Eye Near:   Left Eye Near:    Bilateral Near:     Physical Exam Vitals and nursing note reviewed.  Constitutional:      General: He is not in acute distress.    Appearance: Normal appearance. He is well-developed. He is  not ill-appearing.  HENT:     Right Ear: Tympanic membrane normal.     Left Ear: Tympanic membrane normal.  Nose: Congestion and rhinorrhea present.     Mouth/Throat:     Mouth: Mucous membranes are moist.     Pharynx: Oropharynx is clear.  Cardiovascular:     Rate and Rhythm: Normal rate and regular rhythm.     Heart sounds: Normal heart sounds.  Pulmonary:     Effort: Pulmonary effort is normal. No respiratory distress.     Breath sounds: Normal breath sounds.  Musculoskeletal:     Cervical back: Neck supple.  Skin:    General: Skin is warm and dry.  Neurological:     Mental Status: He is alert.  Psychiatric:        Mood and Affect: Mood normal.        Behavior: Behavior normal.      UC Treatments / Results  Labs (all labs ordered are listed, but only abnormal results are displayed) Labs Reviewed - No data to display  EKG   Radiology No results found.  Procedures Procedures (including critical care time)  Medications Ordered in UC Medications - No data to display  Initial Impression / Assessment and Plan / UC Course  I have reviewed the triage vital signs and the nursing notes.  Pertinent labs & imaging results that were available during my care of the patient were reviewed by me and considered in my medical decision making (see chart for details).    Acute sinusitis, acute bronchitis.  Treating with Zithromax and prednisone.  Education provided on sinus infection and bronchitis.  Instructed patient to follow up with him PCP if him symptoms are not improving.  He agrees to plan of care.    Final Clinical Impressions(s) / UC Diagnoses   Final diagnoses:  Acute non-recurrent maxillary sinusitis  Acute bronchitis, unspecified organism     Discharge Instructions      Take the Zithromax and prednisone as directed.  Follow up with your primary care provider if your symptoms are not improving.        ED Prescriptions     Medication Sig Dispense  Auth. Provider   azithromycin (ZITHROMAX) 250 MG tablet Take 1 tablet (250 mg total) by mouth daily. Take first 2 tablets together, then 1 every day until finished. 6 tablet Sharion Balloon, NP   predniSONE (STERAPRED UNI-PAK 21 TAB) 10 MG (21) TBPK tablet Take by mouth daily. As directed 21 tablet Sharion Balloon, NP      PDMP not reviewed this encounter.   Sharion Balloon, NP 09/01/22 [phone]

## 2022-09-17 ENCOUNTER — Encounter: Payer: Self-pay | Admitting: Family Medicine

## 2022-09-17 ENCOUNTER — Ambulatory Visit (INDEPENDENT_AMBULATORY_CARE_PROVIDER_SITE_OTHER): Payer: Medicare Other | Admitting: Family Medicine

## 2022-09-17 VITALS — BP 138/78 | HR 69 | Temp 97.6°F | Ht 73.0 in | Wt 180.4 lb

## 2022-09-17 DIAGNOSIS — R0982 Postnasal drip: Secondary | ICD-10-CM | POA: Diagnosis not present

## 2022-09-17 MED ORDER — LEVOCETIRIZINE DIHYDROCHLORIDE 5 MG PO TABS
5.0000 mg | ORAL_TABLET | Freq: Every evening | ORAL | 3 refills | Status: DC
Start: 2022-09-17 — End: 2022-11-10

## 2022-09-17 NOTE — Progress Notes (Signed)
Patient ID: Benjamin Robles, male    DOB: 04-17-1956, 67 y.o.   MRN: [id number]  This visit was conducted in person.  BP 138/78   Pulse 69   Temp 97.6 F (36.4 C) (Temporal)   Ht '6\' 1"'$  (1.854 m)   Wt 180 lb 6 oz (81.8 kg)   SpO2 98%   BMI 23.80 kg/m    CC: congestion Subjective:   HPI: Benjamin Robles is a 67 y.o. male presenting on 09/17/2022 for Nasal Congestion (C/o nasal/chest congestion and drainage. Seen on 09/01/22 at [facility], dx acute non-recurrent maxillary sinusitis; acute bronchitis. Treated with abx, sxs improved but have returned.  )   Known CLL followed by onc Tasia Catchings).   Seen at [place] [facility] 2 wks ago with dx acute bronchitis and sinusitis treated with azithromycin course + prednisone taper.  Symptoms initially improved but now notes recurrence of chest congestion as well as persistence of PNdrainage. Fluctuating symptoms. Coughing up clear to light yellow sputum. Yesterday had sinus pressure headache behind eyes. Currently no significant congestion.   No fevers/chills, ear or tooth pain, ST, facial pain.   He continues using flonase (chronic) every morning as well as sinus rinse without relief. Also using guaifenesin for the past month. Takes nyquil at night time.      Relevant past medical, surgical, family and social history reviewed and updated as indicated. Interim medical history since our last visit reviewed. Allergies and medications reviewed and updated. Outpatient Medications Prior to Visit  Medication Sig Dispense Refill   calcium carbonate (TUMS - DOSED IN MG ELEMENTAL CALCIUM) 500 MG chewable tablet Chew 1 tablet by mouth as needed.       Cholecalciferol (VITAMIN D3 PO) Take 1 capsule by mouth daily.     cyanocobalamin (V-R VITAMIN B-12) 500 MCG tablet Take 1 tablet (500 mcg total) by mouth daily.     esomeprazole (NEXIUM) 20 MG capsule Take 1 capsule (20 mg total) by mouth daily at 12 noon.     fluticasone (FLONASE) 50 MCG/ACT nasal spray USE 2 SPRAYS  NASALY DAILY 48 g 3   hydrOXYzine (ATARAX/VISTARIL) 25 MG tablet TAKE 1/2-1 TABLET BY MOUTH TWO TIMES A DAY AS NEEDED FOR ANXIETY (SEDATION PRECAUTIONS) 30 tablet 1   magnesium 30 MG tablet Take 30 mg by mouth daily.     multivitamin (THERAGRAN) per tablet Take 1 tablet by mouth daily.       psyllium (METAMUCIL) 58.6 % powder Take 1 packet by mouth daily.      sildenafil (VIAGRA) 50 MG tablet TAKE ONE TABLET BY MOUTH DAILY AS NEEDED FOR ERECTILE DYSFUNCTION 10 tablet 3   predniSONE (STERAPRED UNI-PAK 21 TAB) 10 MG (21) TBPK tablet Take by mouth daily. As directed 21 tablet 0   azithromycin (ZITHROMAX) 250 MG tablet Take 1 tablet (250 mg total) by mouth daily. Take first 2 tablets together, then 1 every day until finished. 6 tablet 0   No facility-administered medications prior to visit.     Per HPI unless specifically indicated in ROS section below Review of Systems  Objective:  BP 138/78   Pulse 69   Temp 97.6 F (36.4 C) (Temporal)   Ht '6\' 1"'$  (1.854 m)   Wt 180 lb 6 oz (81.8 kg)   SpO2 98%   BMI 23.80 kg/m   Wt Readings from Last 3 Encounters:  09/17/22 180 lb 6 oz (81.8 kg)  08/05/22 177 lb (80.3 kg)  07/03/22 174 lb (78.9 kg)  Physical Exam Vitals and nursing note reviewed.  Constitutional:      Appearance: Normal appearance. He is not ill-appearing.  HENT:     Head: Normocephalic and atraumatic.     Right Ear: Hearing, tympanic membrane, ear canal and external ear normal. There is no impacted cerumen.     Left Ear: Hearing, tympanic membrane, ear canal and external ear normal. There is no impacted cerumen.     Ears:     Comments: Wears hearing aides    Nose: Mucosal edema present. No congestion or rhinorrhea.     Right Turbinates: Not enlarged or swollen.     Left Turbinates: Not enlarged or swollen.     Right Sinus: No maxillary sinus tenderness or frontal sinus tenderness.     Left Sinus: No maxillary sinus tenderness or frontal sinus tenderness.      Mouth/Throat:     Mouth: Mucous membranes are moist.     Pharynx: Oropharynx is clear. No oropharyngeal exudate or posterior oropharyngeal erythema.     Tonsils: No tonsillar exudate.     Comments: Posterior oropharyngeal cobblestoning Eyes:     Extraocular Movements: Extraocular movements intact.     Conjunctiva/sclera: Conjunctivae normal.     Pupils: Pupils are equal, round, and reactive to light.  Cardiovascular:     Rate and Rhythm: Normal rate and regular rhythm.     Pulses: Normal pulses.     Heart sounds: Normal heart sounds. No murmur heard. Pulmonary:     Effort: Pulmonary effort is normal. No respiratory distress.     Breath sounds: Normal breath sounds. No wheezing, rhonchi or rales.  Musculoskeletal:     Cervical back: Normal range of motion and neck supple. No rigidity.     Right lower leg: No edema.     Left lower leg: No edema.  Lymphadenopathy:     Cervical: No cervical adenopathy.  Skin:    General: Skin is warm and dry.     Findings: No rash.  Neurological:     Mental Status: He is alert.  Psychiatric:        Mood and Affect: Mood normal.        Behavior: Behavior normal.       Results for orders placed or performed in visit on 05/12/22  Miscellaneous [facility] test (send-out)  Result Value Ref Range   [facility] test code [id number]    [facility] test name IGVH Somatic Hypermutation    Misc [facility] result COMMENT   Lactate dehydrogenase  Result Value Ref Range   LDH 107 98 - 192 U/L  CBC with Differential/Platelet  Result Value Ref Range   WBC 14.6 (H) 4.0 - 10.5 K/uL   RBC 4.64 4.22 - 5.81 MIL/uL   Hemoglobin 14.2 13.0 - 17.0 g/dL   HCT 42.8 39.0 - 52.0 %   MCV 92.2 80.0 - 100.0 fL   MCH 30.6 26.0 - 34.0 pg   MCHC 33.2 30.0 - 36.0 g/dL   RDW 12.6 11.5 - 15.5 %   Platelets 286 150 - 400 K/uL   nRBC 0.0 0.0 - 0.2 %   Neutrophils Relative % 41 %   Neutro Abs 6.0 1.7 - 7.7 K/uL   Lymphocytes Relative 54 %   Lymphs Abs 7.8 (H) 0.7 - 4.0 K/uL   Monocytes  Relative 4 %   Monocytes Absolute 0.6 0.1 - 1.0 K/uL   Eosinophils Relative 1 %   Eosinophils Absolute 0.1 0.0 - 0.5 K/uL   Basophils Relative 0 %  Basophils Absolute 0.1 0.0 - 0.1 K/uL   WBC Morphology SMUDGE CELLS    RBC Morphology MORPHOLOGY UNREMARKABLE    Smear Review Normal platelet morphology    Immature Granulocytes 0 %   Abs Immature Granulocytes 0.02 0.00 - 0.07 K/uL  Comprehensive metabolic panel  Result Value Ref Range   Sodium 138 135 - 145 mmol/L   Potassium 4.3 3.5 - 5.1 mmol/L   Chloride 103 98 - 111 mmol/L   CO2 28 22 - 32 mmol/L   Glucose, Bld 145 (H) 70 - 99 mg/dL   BUN 21 8 - 23 mg/dL   Creatinine, Ser 1.02 0.61 - 1.24 mg/dL   Calcium 8.9 8.9 - 10.3 mg/dL   Total Protein 7.3 6.5 - 8.1 g/dL   Albumin 4.5 3.5 - 5.0 g/dL   AST 20 15 - 41 U/L   ALT 20 0 - 44 U/L   Alkaline Phosphatase 72 38 - 126 U/L   Total Bilirubin 0.7 0.3 - 1.2 mg/dL   GFR, Estimated >60 >60 mL/min   Anion gap 7 5 - 15    Assessment & Plan:   Problem List Items Addressed This Visit     Post-nasal drainage - Primary    Anticipate ongoing PNdrainage after recent sinusitis/bronchitis.  No signs of ongoing bacterial infection. Supportive measures reviewed. Continue flonase, nasal saline, add antihistamine xyzal - Rx to pharmacy.  Update if ongoing or worsening symptoms. Pt agrees with plan.         Meds ordered this encounter  Medications   levocetirizine (XYZAL) 5 MG tablet    Sig: Take 1 tablet (5 mg total) by mouth every evening. For nasal/postnasal drainage    Dispense:  30 tablet    Refill:  3    No orders of the defined types were placed in this encounter.   Patient Instructions  I think you have persistent irritation to throat and sinuses from ongoing drainage. Continue flonase daily, nasal saline irrigation as needed, add antihistamine daily for 1-2 weeks (xyzal or allegra).  Let us know if not improving with this, or if fever >101, worsening productive cough or  sinus congestion ongoing.   Follow up plan: Return if symptoms worsen or fail to improve.  Ria Bush, MD

## 2022-09-17 NOTE — Patient Instructions (Addendum)
I think you have persistent irritation to throat and sinuses from ongoing drainage. Continue flonase daily, nasal saline irrigation as needed, add antihistamine daily for 1-2 weeks (xyzal or allegra).  Let us know if not improving with this, or if fever >101, worsening productive cough or sinus congestion ongoing.

## 2022-09-17 NOTE — Assessment & Plan Note (Signed)
Anticipate ongoing PNdrainage after recent sinusitis/bronchitis.  No signs of ongoing bacterial infection. Supportive measures reviewed. Continue flonase, nasal saline, add antihistamine xyzal - Rx to pharmacy.  Update if ongoing or worsening symptoms. Pt agrees with plan.

## 2022-11-10 ENCOUNTER — Encounter: Payer: Self-pay | Admitting: Oncology

## 2022-11-10 ENCOUNTER — Inpatient Hospital Stay (HOSPITAL_BASED_OUTPATIENT_CLINIC_OR_DEPARTMENT_OTHER): Payer: Medicare Other | Admitting: Oncology

## 2022-11-10 ENCOUNTER — Inpatient Hospital Stay: Payer: Medicare Other | Attending: Oncology

## 2022-11-10 VITALS — BP 143/89 | HR 56 | Temp 96.0°F | Resp 18 | Wt 179.9 lb

## 2022-11-10 DIAGNOSIS — Z789 Other specified health status: Secondary | ICD-10-CM | POA: Diagnosis not present

## 2022-11-10 DIAGNOSIS — M25519 Pain in unspecified shoulder: Secondary | ICD-10-CM | POA: Diagnosis not present

## 2022-11-10 DIAGNOSIS — R161 Splenomegaly, not elsewhere classified: Secondary | ICD-10-CM | POA: Diagnosis not present

## 2022-11-10 DIAGNOSIS — C9110 Chronic lymphocytic leukemia of B-cell type not having achieved remission: Secondary | ICD-10-CM | POA: Diagnosis not present

## 2022-11-10 DIAGNOSIS — Z87891 Personal history of nicotine dependence: Secondary | ICD-10-CM | POA: Insufficient documentation

## 2022-11-10 DIAGNOSIS — Z8042 Family history of malignant neoplasm of prostate: Secondary | ICD-10-CM | POA: Diagnosis not present

## 2022-11-10 DIAGNOSIS — R5382 Chronic fatigue, unspecified: Secondary | ICD-10-CM | POA: Diagnosis not present

## 2022-11-10 LAB — CBC WITH DIFFERENTIAL/PLATELET
Abs Immature Granulocytes: 0.03 10*3/uL (ref 0.00–0.07)
Basophils Absolute: 0 10*3/uL (ref 0.0–0.1)
Basophils Relative: 0 %
Eosinophils Absolute: 0.1 10*3/uL (ref 0.0–0.5)
Eosinophils Relative: 1 %
HCT: 41.8 % (ref 39.0–52.0)
Hemoglobin: 14.2 g/dL (ref 13.0–17.0)
Immature Granulocytes: 0 %
Lymphocytes Relative: 52 %
Lymphs Abs: 6.4 10*3/uL — ABNORMAL HIGH (ref 0.7–4.0)
MCH: 30.7 pg (ref 26.0–34.0)
MCHC: 34 g/dL (ref 30.0–36.0)
MCV: 90.5 fL (ref 80.0–100.0)
Monocytes Absolute: 0.5 10*3/uL (ref 0.1–1.0)
Monocytes Relative: 4 %
Neutro Abs: 5.4 10*3/uL (ref 1.7–7.7)
Neutrophils Relative %: 43 %
Platelets: 292 10*3/uL (ref 150–400)
RBC: 4.62 MIL/uL (ref 4.22–5.81)
RDW: 12.7 % (ref 11.5–15.5)
Smear Review: NORMAL
WBC: 12.5 10*3/uL — ABNORMAL HIGH (ref 4.0–10.5)
nRBC: 0 % (ref 0.0–0.2)

## 2022-11-10 LAB — COMPREHENSIVE METABOLIC PANEL
ALT: 20 U/L (ref 0–44)
AST: 24 U/L (ref 15–41)
Albumin: 4.4 g/dL (ref 3.5–5.0)
Alkaline Phosphatase: 65 U/L (ref 38–126)
Anion gap: 7 (ref 5–15)
BUN: 15 mg/dL (ref 8–23)
CO2: 26 mmol/L (ref 22–32)
Calcium: 9 mg/dL (ref 8.9–10.3)
Chloride: 104 mmol/L (ref 98–111)
Creatinine, Ser: 0.98 mg/dL (ref 0.61–1.24)
GFR, Estimated: >60 mL/min (ref >60)
Glucose, Bld: 102 mg/dL — ABNORMAL HIGH (ref 70–99)
Potassium: 4.2 mmol/L (ref 3.5–5.1)
Sodium: 137 mmol/L (ref 135–145)
Total Bilirubin: 0.7 mg/dL (ref 0.3–1.2)
Total Protein: 7.2 g/dL (ref 6.5–8.1)

## 2022-11-10 LAB — LACTATE DEHYDROGENASE: LDH: 115 U/L (ref 98–192)

## 2022-11-10 NOTE — Assessment & Plan Note (Signed)
Mild splenomegaly.  Monitor.  

## 2022-11-10 NOTE — Assessment & Plan Note (Addendum)
CLL, IGVH mutation detected.  No significant constitutional symptoms.  Continue watchful waiting.   Check CLL FISH

## 2022-11-10 NOTE — Progress Notes (Signed)
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  No significant constitutional symptoms.  Continue watchful waiting.   Check CLL FISH  Splenomegaly Mild splenomegaly.  Monitor.   Alcohol use Patient has cut back on alcohol consumption.  Encourage his alcohol cessation referred.  Orders Placed This Encounter  Procedures   CBC with Differential ([place] Only)    Standing Status:   Future    Standing Expiration Date:   11/10/2023   CMP ([place] only)    Standing Status:   Future    Standing Expiration Date:   11/10/2023   Lactate dehydrogenase    Standing Status:   Future    Standing Expiration Date:   11/10/2023   Follow up in 6 months.  All questions were answered. The patient knows to call the clinic with any problems, questions or concerns.  Earlie Server, MD, PhD [facility] Hematology Oncology 11/10/2022   Ria Bush, MD  CHIEF COMPLAINTS/REASON FOR VISIT:  Follow-up for CLL  HISTORY OF PRESENTING ILLNESS:  67 y.o.male presents for CLL.  Oncology History  CLL (chronic lymphocytic leukemia) ([place])  01/08/2022 Initial Diagnosis   CLL (chronic lymphocytic leukemia) ([place])   01/08/2022 Cancer Staging   Staging form: Chronic Lymphocytic Leukemia / Small Lymphocytic Lymphoma, AJCC 8th Edition - Clinical: Modified Rai Stage II (Modified Rai risk: Intermediate, Lymphocytosis: Present, Adenopathy: Absent, Organomegaly: Present, Anemia: Absent, Thrombocytopenia: Absent) - Signed by Earlie Server, MD on 05/12/2022 Stage prefix: Initial diagnosis    01/27/2022 Imaging   US abdomen complete showed Borderline size of the spleen.      Former smoker, he stopped in the 1980s.  He drinks 1 beer per day.  Recently has tried to cut back on alcohol consumption. Patient has some  chronic fatigue which is unchanged. + Arthralgia-shoulder pain   INTERVAL HISTORY Benjamin Robles is a 67 y.o. male who has above history reviewed by me today presents for follow up visit to review results. Patient has no new complaints.  Accompanied by wife. Increased fatigue.  Denies unintentional weight loss, fever, night sweats. His weight has been stable.   Review of Systems  Constitutional:  Positive for fatigue. Negative for appetite change, chills, fever and unexpected weight change.  HENT:   Negative for hearing loss and voice change.   Eyes:  Negative for eye problems and icterus.  Respiratory:  Negative for chest tightness, cough and shortness of breath.   Cardiovascular:  Negative for chest pain and leg swelling.  Gastrointestinal:  Negative for abdominal distention and abdominal pain.  Endocrine: Negative for hot flashes.  Genitourinary:  Negative for difficulty urinating, dysuria and frequency.   Musculoskeletal:  Positive for arthralgias.  Skin:  Negative for itching and rash.  Neurological:  Negative for light-headedness and numbness.  Hematological:  Negative for adenopathy. Does not bruise/bleed easily.  Psychiatric/Behavioral:  Negative for confusion.     MEDICAL HISTORY:  Past Medical History:  Diagnosis Date   Allergy    SINUS ALLEGIES   Anticoagulant disorder (HCC)    Anxiety    BPH (benign prostatic hypertrophy)    mild   Bradycardia    Cancer (HCC)    CLL (chronic lymphocytic leukemia) (HCC)    Depression    GERD (gastroesophageal reflux disease)    History of colon  polyps    normal colonoscopy 2012   Pain of right sternoclavicular joint    MRI 06/2012 showing ?erosive arthritis   Umbilical hernia     SURGICAL HISTORY: Past Surgical History:  Procedure Laterality Date   COLONOSCOPY  08/07/2011   WNL, rpt 10 yrs   HERNIA REPAIR     x2   UPPER GASTROINTESTINAL ENDOSCOPY  08/07/2011   chronic superficial gastritis, oozing nodule clipped, neg H  pylori    SOCIAL HISTORY: Social History   Socioeconomic History   Marital status: Married    Spouse name: Not on file   Number of children: 2   Years of education: Not on file   Highest education level: Not on file  Occupational History   Occupation: Database administrator: [employer]  Tobacco Use   Smoking status: Former    Packs/day: 0.50    Years: 7.00    Additional pack years: 0.00    Total pack years: 3.50    Types: Cigarettes    Quit date: 1983    Years since quitting: 41.2   Smokeless tobacco: Never  Substance and Sexual Activity   Alcohol use: Yes    Comment: 1 beer/day   Drug use: No   Sexual activity: Yes  Other Topics Concern   Not on file  Social History Narrative   "Will"   Lives with wife, 2 cats and 2 dogs, grown children   Occupation: Air cabin crew support   Activity: no regular exercise routine   Diet: good water, fruits/vegetables daily   Social Determinants of Health   Financial Resource Strain: Not on file  Food Insecurity: Not on file  Transportation Needs: Not on file  Physical Activity: Not on file  Stress: Not on file  Social Connections: Not on file  Intimate Partner Violence: Not on file    FAMILY HISTORY: Family History  Problem Relation Age of Onset   Hypertension Mother    Depression Father    Prostate cancer Father 60       slow growing   CAD Neg Hx    Stroke Neg Hx    Diabetes Neg Hx    Colon cancer Neg Hx    Rectal cancer Neg Hx    Stomach cancer Neg Hx     ALLERGIES:  is allergic to amoxicillin-pot clavulanate.  MEDICATIONS:  Current Outpatient Medications  Medication Sig Dispense Refill   calcium carbonate (TUMS - DOSED IN MG ELEMENTAL CALCIUM) 500 MG chewable tablet Chew 1 tablet by mouth as needed.       Cholecalciferol (VITAMIN D3 PO) Take 1 capsule by mouth daily.     cyanocobalamin (V-R VITAMIN B-12) 500 MCG tablet Take 1 tablet (500 mcg total) by mouth daily.     esomeprazole (NEXIUM) 20 MG  capsule Take 1 capsule (20 mg total) by mouth daily at 12 noon.     fluticasone (FLONASE) 50 MCG/ACT nasal spray USE 2 SPRAYS NASALY DAILY 48 g 3   magnesium 30 MG tablet Take 30 mg by mouth daily.     multivitamin (THERAGRAN) per tablet Take 1 tablet by mouth daily.       psyllium (METAMUCIL) 58.6 % powder Take 1 packet by mouth daily.      Red Yeast Rice Extract (RED YEAST RICE PO) Take by mouth.     sildenafil (VIAGRA) 50 MG tablet TAKE ONE TABLET BY MOUTH DAILY AS NEEDED FOR ERECTILE DYSFUNCTION 10 tablet 3   No current facility-administered medications  for this visit.     PHYSICAL EXAMINATION: ECOG PERFORMANCE STATUS: 0 - Asymptomatic Vitals:   11/10/22 1304  BP: (!) 143/89  Pulse: (!) 56  Resp: 18  Temp: (!) 96 F (35.6 C)  SpO2: 100%   Filed Weights   11/10/22 1304  Weight: 179 lb 14.4 oz (81.6 kg)    Physical Exam Constitutional:      General: He is not in acute distress. HENT:     Head: Normocephalic and atraumatic.  Eyes:     General: No scleral icterus. Cardiovascular:     Rate and Rhythm: Normal rate.  Pulmonary:     Effort: Pulmonary effort is normal. No respiratory distress.     Breath sounds: No wheezing.  Abdominal:     General: Bowel sounds are normal. There is no distension.     Palpations: Abdomen is soft.     Comments: Palpable liver edge just below costal margin.  Musculoskeletal:        General: No deformity. Normal range of motion.     Cervical back: Normal range of motion.  Skin:    General: Skin is warm and dry.  Neurological:     Mental Status: He is alert and oriented to person, place, and time. Mental status is at baseline.     Cranial Nerves: No cranial nerve deficit.  Psychiatric:        Mood and Affect: Mood normal.     LABORATORY DATA:  I have reviewed the data as listed    Latest Ref Rng & Units 11/10/2022   12:50 PM 05/12/2022    1:05 PM 12/24/2021    9:56 AM  CBC  WBC 4.0 - 10.5 K/uL 12.5  14.6  15.1   Hemoglobin 13.0 -  17.0 g/dL 14.2  14.2  15.4   Hematocrit 39.0 - 52.0 % 41.8  42.8  45.2   Platelets 150 - 400 K/uL 292  286  319       Latest Ref Rng & Units 11/10/2022   12:50 PM 05/12/2022    1:05 PM 12/24/2021    9:56 AM  CMP  Glucose 70 - 99 mg/dL 102  145  119   BUN 8 - 23 mg/dL 15  21  13    Creatinine 0.61 - 1.24 mg/dL 0.98  1.02  1.01   Sodium 135 - 145 mmol/L 137  138  135   Potassium 3.5 - 5.1 mmol/L 4.2  4.3  3.8   Chloride 98 - 111 mmol/L 104  103  101   CO2 22 - 32 mmol/L 26  28  28    Calcium 8.9 - 10.3 mg/dL 9.0  8.9  9.4   Total Protein 6.5 - 8.1 g/dL 7.2  7.3  7.7   Total Bilirubin 0.3 - 1.2 mg/dL 0.7  0.7  0.8   Alkaline Phos 38 - 126 U/L 65  72  67   AST 15 - 41 U/L 24  20  21    ALT 0 - 44 U/L 20  20  18    ;  RADIOGRAPHIC STUDIES: I have personally reviewed the radiological images as listed and agreed with the findings in the report. No results found.

## 2022-11-10 NOTE — Assessment & Plan Note (Addendum)
Patient has cut back on alcohol consumption.  Encourage his alcohol cessation referred.

## 2022-11-17 LAB — FISH HES LEUKEMIA, 4Q12 REA
Cells Analyzed: 200
Cells Counted:: 200

## 2022-11-30 ENCOUNTER — Other Ambulatory Visit: Payer: Self-pay | Admitting: Family Medicine

## 2022-11-30 DIAGNOSIS — N401 Enlarged prostate with lower urinary tract symptoms: Secondary | ICD-10-CM

## 2022-11-30 DIAGNOSIS — E7800 Pure hypercholesterolemia, unspecified: Secondary | ICD-10-CM

## 2022-11-30 DIAGNOSIS — E538 Deficiency of other specified B group vitamins: Secondary | ICD-10-CM

## 2022-11-30 DIAGNOSIS — C9110 Chronic lymphocytic leukemia of B-cell type not having achieved remission: Secondary | ICD-10-CM

## 2022-12-02 ENCOUNTER — Other Ambulatory Visit (INDEPENDENT_AMBULATORY_CARE_PROVIDER_SITE_OTHER): Payer: Medicare Other

## 2022-12-02 DIAGNOSIS — E7800 Pure hypercholesterolemia, unspecified: Secondary | ICD-10-CM | POA: Diagnosis not present

## 2022-12-02 DIAGNOSIS — N401 Enlarged prostate with lower urinary tract symptoms: Secondary | ICD-10-CM | POA: Diagnosis not present

## 2022-12-02 DIAGNOSIS — E538 Deficiency of other specified B group vitamins: Secondary | ICD-10-CM

## 2022-12-02 DIAGNOSIS — R350 Frequency of micturition: Secondary | ICD-10-CM | POA: Diagnosis not present

## 2022-12-02 LAB — LIPID PANEL
Cholesterol: 208 mg/dL — ABNORMAL HIGH (ref 0–200)
HDL: 50.2 mg/dL (ref >39.00)
LDL Cholesterol: 138 mg/dL — ABNORMAL HIGH (ref 0–99)
NonHDL: 157.55
Total CHOL/HDL Ratio: 4
Triglycerides: 98 mg/dL (ref 0.0–149.0)
VLDL: 19.6 mg/dL (ref 0.0–40.0)

## 2022-12-02 LAB — PSA: PSA: 2.14 ng/mL (ref 0.10–4.00)

## 2022-12-02 LAB — VITAMIN B12: Vitamin B-12: 778 pg/mL (ref 211–911)

## 2022-12-09 ENCOUNTER — Encounter: Payer: Self-pay | Admitting: Family Medicine

## 2022-12-09 ENCOUNTER — Ambulatory Visit (INDEPENDENT_AMBULATORY_CARE_PROVIDER_SITE_OTHER): Payer: Medicare Other | Admitting: Family Medicine

## 2022-12-09 VITALS — BP 138/76 | HR 56 | Temp 97.4°F | Ht 72.5 in | Wt 175.5 lb

## 2022-12-09 DIAGNOSIS — Z8249 Family history of ischemic heart disease and other diseases of the circulatory system: Secondary | ICD-10-CM

## 2022-12-09 DIAGNOSIS — C9110 Chronic lymphocytic leukemia of B-cell type not having achieved remission: Secondary | ICD-10-CM

## 2022-12-09 DIAGNOSIS — N529 Male erectile dysfunction, unspecified: Secondary | ICD-10-CM

## 2022-12-09 DIAGNOSIS — Z7189 Other specified counseling: Secondary | ICD-10-CM | POA: Diagnosis not present

## 2022-12-09 DIAGNOSIS — Z0000 Encounter for general adult medical examination without abnormal findings: Secondary | ICD-10-CM

## 2022-12-09 DIAGNOSIS — Z789 Other specified health status: Secondary | ICD-10-CM

## 2022-12-09 DIAGNOSIS — E538 Deficiency of other specified B group vitamins: Secondary | ICD-10-CM

## 2022-12-09 DIAGNOSIS — R350 Frequency of micturition: Secondary | ICD-10-CM

## 2022-12-09 DIAGNOSIS — E7800 Pure hypercholesterolemia, unspecified: Secondary | ICD-10-CM

## 2022-12-09 DIAGNOSIS — N401 Enlarged prostate with lower urinary tract symptoms: Secondary | ICD-10-CM

## 2022-12-09 DIAGNOSIS — K219 Gastro-esophageal reflux disease without esophagitis: Secondary | ICD-10-CM

## 2022-12-09 MED ORDER — RED YEAST RICE 600 MG PO CAPS: 1.0000 | ORAL_CAPSULE | Freq: Two times a day (BID) | ORAL | Status: DC

## 2022-12-09 MED ORDER — SILDENAFIL CITRATE 100 MG PO TABS: 50.0000 mg | ORAL_TABLET | ORAL | 6 refills | Status: DC | PRN

## 2022-12-09 MED ORDER — ATORVASTATIN CALCIUM 20 MG PO TABS: 20.0000 mg | ORAL_TABLET | Freq: Every day | ORAL | 3 refills | Status: DC

## 2022-12-09 NOTE — Assessment & Plan Note (Signed)

## 2022-12-09 NOTE — Patient Instructions (Addendum)
Bring [place] a copy of your living will to update your chart.  You are doing well today  May try higher viagra dose  sent to pharmacy.  Trial atorvastatin  daily in place of red yeast rice.  Continue nexium and other medicines.  Return in 1 year for next physical/wellness visit

## 2022-12-09 NOTE — Assessment & Plan Note (Addendum)
Monitoring CLL, appreciate onc care Cathie Hoops).

## 2022-12-09 NOTE — Assessment & Plan Note (Addendum)
Chronic on RYR twice daily. Reviewed diet choices to improve cholesterol levels. Reviewed ASCVD risk score - agrees to trial atorvastatin  daily in place of RYR. The 10-year ASCVD risk score (Arnett DK, et al., 2019) is: 15.8%   Values used to calculate the score:     Age: 67 years     Sex: Male     Is Non-Hispanic African American: No     Diabetic: No     Tobacco smoker: No     Systolic Blood Pressure: 138 mmHg     Is BP treated: No     HDL Cholesterol: 50.2 mg/dL     Total Cholesterol: 208 mg/dL

## 2022-12-09 NOTE — Assessment & Plan Note (Addendum)
Requests coronary calcium score ordered at [facility] outpatient imaging. Denies current symptoms. Aware this may not be covered by insurance.

## 2022-12-09 NOTE — Assessment & Plan Note (Signed)
PSA stable - continue yearly check.

## 2022-12-09 NOTE — Assessment & Plan Note (Signed)
Advanced directive - has this at home. Wife is HCPOA. Asked to bring [place] copy. Full code. Ok with temporary measures, no prolonged life support if terminal condition. Unsure about feeding tube - depends.

## 2022-12-09 NOTE — Assessment & Plan Note (Signed)
Continues OTC nexium  daily with PRN tums. Recent reassuring EGD.

## 2022-12-09 NOTE — Assessment & Plan Note (Signed)
Has significantly cut down.  ?

## 2022-12-09 NOTE — Assessment & Plan Note (Signed)
Continues b12 daily.

## 2022-12-09 NOTE — Progress Notes (Unsigned)
Ph: [phone]       Fax: [phone]   Patient ID: Benjamin Robles, male    DOB: [birth year], 67 y.o.   MRN: [id number]  This visit was conducted in person.  BP 138/76   Pulse (!) 56   Temp (!) 97.4 F (36.3 C) (Temporal)   Ht 6' 0.5" (1.842 m)   Wt 175 lb 8 oz (79.6 kg)   SpO2 99%   BMI 23.47 kg/m    CC: welcome to medicare visit  Subjective:   HPI: Benjamin Robles is a 67 y.o. male presenting on 12/09/2022 for Welcome to Medicare Exam (Wants to discuss sildenafil. )   Got medicare 01/2022.   Vision Screening   Right eye Left eye Both eyes  Without correction     With correction 20/20 20/20 20/15   Hearing Screening - Comments:: Wears B hearing aids. Wearing at today's OV.   Flowsheet Row Office Visit from 12/09/2022 [facility]  PHQ-2 Total Score 0          12/09/2022    9:58 AM  Fall Risk   Falls in the past year? 1  Number falls in past yr: 0  Injury with Fall? 1    New dx CLL last year, now followed by Dr Cathie Hoops oncology - watchful waiting.   ED - on viagra 50mg  daily - notes decreased effect.   Requests calcium coronary score done in [facility] outpatient imaging center as mother with h/o CAD/stents.   Preventative:  Colonoscopy 12/2021 - HP x1, rpt 10 yrs Marina Goodell)  EGD 12/2021 - WNL Marina Goodell) Prostate cancer screening - father with h/o slow growing prostate cancer. H/o BPH with some frequency and nocturia x1-2 depending on fluids at night. PSA yearly.  Lung cancer screening - not eligible  Flu shot - yearly  Tetanus shot - 2003, 2010, Tdap 11/2019  COVID vaccine - Moderna 10/2019, 11/2019, boosters 06/2020, 02/2021, bivalent Pfizer 07/2021 Prevnar-20 11/2021 RSV - discussed  Shingrix - 11/2019, 05/2020 Advanced directive - has this at home. Wife is HCPOA. Asked to bring [place] copy. Full code. Ok with temporary measures, no prolonged life support if terminal condition. Unsure about feeding tube - depends.  Seat belt use discussed   Sunscreen use discussed - no changing moles on skin, sees derm yearly  Sleep - averaging 6-7 hours/night Ex smoker - quit remotely (10 PY hx)  Alcohol - minimal - cut down significantly  Dentist - q6 mo  Eye exam yearly  Bowel - no constipation Bladder - no incontinence   Lives with wife, 2 cats and 2 dogs, grown children   Occupation: Orthoptist support   Activity: aerobics and weight training at [employer] 3x/wk Diet: good water, fruits/vegetables daily     Relevant past medical, surgical, family and social history reviewed and updated as indicated. Interim medical history since our last visit reviewed. Allergies and medications reviewed and updated. Outpatient Medications Prior to Visit  Medication Sig Dispense Refill   calcium carbonate (TUMS - DOSED IN MG ELEMENTAL CALCIUM) 500 MG chewable tablet Chew 1 tablet by mouth as needed.       Cholecalciferol (VITAMIN D3 PO) Take 1 capsule by mouth daily.     cyanocobalamin (V-R VITAMIN B-12) 500 MCG tablet Take 1 tablet (500 mcg total) by mouth daily.     esomeprazole (NEXIUM) 20 MG capsule Take 1 capsule (20 mg total) by mouth daily at 12 noon.     fluticasone (  FLONASE) 50 MCG/ACT nasal spray USE 2 SPRAYS NASALY DAILY 48 g 3   magnesium 30 MG tablet Take 30 mg by mouth daily.     multivitamin (THERAGRAN) per tablet Take 1 tablet by mouth daily.       psyllium (METAMUCIL) 58.6 % powder Take 1 packet by mouth daily.      Red Yeast Rice Extract (RED YEAST RICE PO) Take by mouth.     sildenafil (VIAGRA) 50 MG tablet TAKE ONE TABLET BY MOUTH DAILY AS NEEDED FOR ERECTILE DYSFUNCTION 10 tablet 3   No facility-administered medications prior to visit.     Per HPI unless specifically indicated in ROS section below Review of Systems  Objective:  BP 138/76   Pulse (!) 56   Temp (!) 97.4 F (36.3 C) (Temporal)   Ht 6' 0.5" (1.842 m)   Wt 175 lb 8 oz (79.6 kg)   SpO2 99%   BMI 23.47 kg/m   Wt Readings from Last 3 Encounters:  12/09/22  175 lb 8 oz (79.6 kg)  11/10/22 179 lb 14.4 oz (81.6 kg)  09/17/22 180 lb 6 oz (81.8 kg)      Physical Exam Vitals and nursing note reviewed.  Constitutional:      General: He is not in acute distress.    Appearance: Normal appearance. He is well-developed. He is not ill-appearing.  HENT:     Head: Normocephalic and atraumatic.     Right Ear: Hearing, tympanic membrane, ear canal and external ear normal.     Left Ear: Hearing, tympanic membrane, ear canal and external ear normal.     Nose: Nose normal.     Mouth/Throat:     Mouth: Mucous membranes are moist.     Pharynx: Oropharynx is clear. No oropharyngeal exudate or posterior oropharyngeal erythema.  Eyes:     General: No scleral icterus.    Extraocular Movements: Extraocular movements intact.     Conjunctiva/sclera: Conjunctivae normal.     Pupils: Pupils are equal, round, and reactive to light.  Neck:     Thyroid: No thyroid mass or thyromegaly.     Vascular: No carotid bruit.  Cardiovascular:     Rate and Rhythm: Normal rate and regular rhythm.     Pulses: Normal pulses.          Radial pulses are 2+ on the right side and 2+ on the left side.     Heart sounds: Normal heart sounds. No murmur heard. Pulmonary:     Effort: Pulmonary effort is normal. No respiratory distress.     Breath sounds: Normal breath sounds. No wheezing, rhonchi or rales.  Abdominal:     General: Bowel sounds are normal. There is no distension.     Palpations: Abdomen is soft. There is no mass.     Tenderness: There is no abdominal tenderness. There is no guarding or rebound.     Hernia: No hernia is present.  Musculoskeletal:        General: Normal range of motion.     Cervical back: Normal range of motion and neck supple.     Right lower leg: No edema.     Left lower leg: No edema.  Lymphadenopathy:     Cervical: No cervical adenopathy.  Skin:    General: Skin is warm and dry.     Findings: No rash.  Neurological:     General: No focal  deficit present.     Mental Status: He is alert and oriented  to person, place, and time.     Comments:  Recall 3/3 Calculation 5/5 DLROW  Psychiatric:        Mood and Affect: Mood normal.        Behavior: Behavior normal.        Thought Content: Thought content normal.        Judgment: Judgment normal.       Results for orders placed or performed in visit on 12/02/22  Vitamin B12  Result Value Ref Range   Vitamin B-12 778 211 - 911 pg/mL  PSA  Result Value Ref Range   PSA 2.14 0.10 - 4.00 ng/mL  Lipid panel  Result Value Ref Range   Cholesterol 208 (H) 0 - 200 mg/dL   Triglycerides 09.8 0.0 - 149.0 mg/dL   HDL 11.91 >47.82 mg/dL   VLDL 95.6 0.0 - 21.3 mg/dL   LDL Cholesterol 086 (H) 0 - 99 mg/dL   Total CHOL/HDL Ratio 4    NonHDL 157.55    Lab Results  Component Value Date   WBC 12.5 (H) 11/10/2022   HGB 14.2 11/10/2022   HCT 41.8 11/10/2022   MCV 90.5 11/10/2022   PLT 292 11/10/2022    EKG - NSR rate 50s, normal axis, intervals, no hypertrophy or acute ST/T changes  Assessment & Plan:   Problem List Items Addressed This Visit     CLL (chronic lymphocytic leukemia) (Chronic)    Monitoring CLL, appreciate onc care Cathie Hoops).       Welcome to Medicare preventive visit - Primary (Chronic)    I have personally reviewed the Medicare Annual Wellness questionnaire and have noted 1. The patient's medical and social history 2. Their use of alcohol, tobacco or illicit drugs 3. Their current medications and supplements 4. The patient's functional ability including ADL's, fall risks, home safety risks and hearing or visual impairment. Cognitive function has been assessed and addressed as indicated.  5. Diet and physical activity 6. Evidence for depression or mood disorders The patients weight, height, BMI have been recorded in the chart. I have made referrals, counseling and provided education to the patient based on review of the above and I have provided the pt with a  written personalized care plan for preventive services. Provider list updated.. See scanned questionairre as needed for further documentation. Reviewed preventative protocols and updated unless pt declined.       Relevant Orders   EKG 12-Lead (Completed)   Advanced directives, counseling/discussion (Chronic)    Advanced directive - has this at home. Wife is HCPOA. Asked to bring [place] copy. Full code. Ok with temporary measures, no prolonged life support if terminal condition. Unsure about feeding tube - depends.       Pure hypercholesterolemia    Chronic on RYR twice daily. Reviewed diet choices to improve cholesterol levels. Reviewed ASCVD risk score - agrees to trial atorvastatin  daily in place of RYR. The 10-year ASCVD risk score (Arnett DK, et al., 2019) is: 15.8%   Values used to calculate the score:     Age: 20 years     Sex: Male     Is Non-Hispanic African American: No     Diabetic: No     Tobacco smoker: No     Systolic Blood Pressure: 138 mmHg     Is BP treated: No     HDL Cholesterol: 50.2 mg/dL     Total Cholesterol: 208 mg/dL       Relevant Medications   sildenafil (VIAGRA) 100  MG tablet   atorvastatin (LIPITOR) 20 MG tablet   Other Relevant Orders   CT CARDIAC SCORING (SELF PAY ONLY)   GERD (gastroesophageal reflux disease)    Continues OTC nexium  daily with PRN tums. Recent reassuring EGD.       Benign prostatic hyperplasia    PSA stable - continue yearly check.       Low serum vitamin B12    Continues b12 daily.       Alcohol use    Has significantly cut down.       Family history of coronary artery disease    Requests coronary calcium score ordered at [facility] outpatient imaging. Denies current symptoms. Aware this may not be covered by insurance.       Relevant Orders   CT CARDIAC SCORING (SELF PAY ONLY)   Erectile dysfunction    Will trial viagra  dose.       Relevant Medications   sildenafil (VIAGRA) 100 MG tablet      Meds ordered this encounter  Medications   sildenafil (VIAGRA) 100 MG tablet    Sig: Take 0.5-1 tablets (50-100 mg total) by mouth as needed for erectile dysfunction.    Dispense:  10 tablet    Refill:  6   DISCONTD: Red Yeast Rice 600 MG CAPS    Sig: Take 1 capsule (600 mg total) by mouth in the morning and at bedtime.   atorvastatin (LIPITOR) 20 MG tablet    Sig: Take 1 tablet (20 mg total) by mouth daily.    Dispense:  90 tablet    Refill:  3    Orders Placed This Encounter  Procedures   CT CARDIAC SCORING (SELF PAY ONLY)    Standing Status:   Future    Standing Expiration Date:   12/10/2023    Order Specific Question:   Preferred imaging location?    Answer:   [facility]   EKG 12-Lead    Patient Instructions  Bring [place] a copy of your living will to update your chart.  You are doing well today  May try higher viagra dose  sent to pharmacy.  Trial atorvastatin  daily in place of red yeast rice.  Continue nexium and other medicines.  Return in 1 year for next physical/wellness visit  Follow up plan: Return in about 1 year (around 12/09/2023) for annual exam, prior fasting for blood work, medicare wellness visit.  [name], MD

## 2022-12-10 ENCOUNTER — Encounter: Payer: Self-pay | Admitting: *Deleted

## 2022-12-10 DIAGNOSIS — N529 Male erectile dysfunction, unspecified: Secondary | ICD-10-CM | POA: Insufficient documentation

## 2022-12-10 NOTE — Assessment & Plan Note (Signed)
Will trial viagra  dose.

## 2022-12-24 ENCOUNTER — Ambulatory Visit
Admission: RE | Admit: 2022-12-24 | Discharge: 2022-12-24 | Disposition: A | Discharge status: Home / Self Care | Payer: Medicare Other | Source: Ambulatory Visit | Attending: Family Medicine | Admitting: Family Medicine

## 2022-12-24 DIAGNOSIS — Z8249 Family history of ischemic heart disease and other diseases of the circulatory system: Secondary | ICD-10-CM

## 2022-12-24 DIAGNOSIS — E7800 Pure hypercholesterolemia, unspecified: Secondary | ICD-10-CM

## 2022-12-30 ENCOUNTER — Encounter: Payer: Self-pay | Admitting: Family Medicine

## 2022-12-30 DIAGNOSIS — R918 Other nonspecific abnormal finding of lung field: Secondary | ICD-10-CM | POA: Insufficient documentation

## 2023-01-21 DIAGNOSIS — K080 Exfoliation of teeth due to systemic causes: Secondary | ICD-10-CM | POA: Diagnosis not present

## 2023-01-22 DIAGNOSIS — B351 Tinea unguium: Secondary | ICD-10-CM | POA: Diagnosis not present

## 2023-01-22 DIAGNOSIS — Z08 Encounter for follow-up examination after completed treatment for malignant neoplasm: Secondary | ICD-10-CM | POA: Diagnosis not present

## 2023-01-22 DIAGNOSIS — L720 Epidermal cyst: Secondary | ICD-10-CM | POA: Diagnosis not present

## 2023-01-22 DIAGNOSIS — Z85828 Personal history of other malignant neoplasm of skin: Secondary | ICD-10-CM | POA: Diagnosis not present

## 2023-03-06 NOTE — Progress Notes (Signed)
[name] [address] [place] [address] Phone: [phone] Subjective:    I'm seeing this patient by the request  of:  [name], MD  CC: Bilateral shoulder pain  [id number]  Elija Howorth Heffington is a 67 y.o. male coming in with complaint of B lateral shoulder pain. Has seen Dr. Denyse Amass before. Was given exercises and does them at least 3 times a week. Pain is at the tip of the shoulders both sides. Abduction produces pain. Pain will wake him at night. Only exercises and tylenol for management thus far.       Past Medical History:  Diagnosis Date   Allergy    SINUS ALLEGIES   Anticoagulant disorder (HCC)    Anxiety    BPH (benign prostatic hypertrophy)    mild   Bradycardia    Cancer (HCC)    CLL (chronic lymphocytic leukemia) (HCC)    Depression    GERD (gastroesophageal reflux disease)    History of colon polyps    normal colonoscopy 2012   Pain of right sternoclavicular joint    MRI 06/2012 showing ?erosive arthritis   Umbilical hernia    Past Surgical History:  Procedure Laterality Date   COLONOSCOPY  08/07/2011   WNL, rpt 10 yrs   COLONOSCOPY  12/2021   HP x1, rpt 10 yrs Marina Goodell)   ESOPHAGOGASTRODUODENOSCOPY  12/2021   WNL Marina Goodell)   HERNIA REPAIR     x2   UPPER GASTROINTESTINAL ENDOSCOPY  08/07/2011   chronic superficial gastritis, oozing nodule clipped, neg H pylori   Social History   Socioeconomic History   Marital status: Married    Spouse name: Not on file   Number of children: 2   Years of education: Not on file   Highest education level: Not on file  Occupational History   Occupation: Charity fundraiser: [employer]  Tobacco Use   Smoking status: Former    Current packs/day: 0.00    Average packs/day: 0.5 packs/day for 7.0 years (3.5 ttl pk-yrs)    Types: Cigarettes    Start date: 41    Quit date: 1983    Years since quitting: 41.5   Smokeless tobacco: Never  Substance and Sexual  Activity   Alcohol use: Yes    Comment: 1 beer/day   Drug use: No   Sexual activity: Yes  Other Topics Concern   Not on file  Social History Narrative   "Will"   Lives with wife, 2 cats and 2 dogs, grown children   Occupation: Orthoptist support   Activity: no regular exercise routine   Diet: good water, fruits/vegetables daily   Social Determinants of Health   Financial Resource Strain: Not on file  Food Insecurity: Not on file  Transportation Needs: Not on file  Physical Activity: Not on file  Stress: Not on file  Social Connections: Not on file   Allergies  Allergen Reactions   Amoxicillin-Pot Clavulanate     REACTION: achey, itch   Family History  Problem Relation Age of Onset   Hypertension Mother    Depression Father    Prostate cancer Father 77       slow growing   CAD Neg Hx    Stroke Neg Hx    Diabetes Neg Hx    Colon cancer Neg Hx    Rectal cancer Neg Hx    Stomach cancer Neg Hx      Current Outpatient Medications (Cardiovascular):  atorvastatin (LIPITOR) 20 MG tablet, Take 1 tablet (20 mg total) by mouth daily.   sildenafil (VIAGRA) 100 MG tablet, Take 0.5-1 tablets (50-100 mg total) by mouth as needed for erectile dysfunction.  Current Outpatient Medications (Respiratory):    fluticasone (FLONASE) 50 MCG/ACT nasal spray, USE 2 SPRAYS NASALY DAILY   Current Outpatient Medications (Hematological):    cyanocobalamin (V-R VITAMIN B-12) 500 MCG tablet, Take 1 tablet (500 mcg total) by mouth daily.  Current Outpatient Medications (Other):    calcium carbonate (TUMS - DOSED IN MG ELEMENTAL CALCIUM) 500 MG chewable tablet, Chew 1 tablet by mouth as needed.     Cholecalciferol (VITAMIN D3 PO), Take 1 capsule by mouth daily.   esomeprazole (NEXIUM) 20 MG capsule, Take 1 capsule (20 mg total) by mouth daily at 12 noon.   magnesium 30 MG tablet, Take 30 mg by mouth daily.   multivitamin (THERAGRAN) per tablet, Take 1 tablet by mouth daily.      psyllium (METAMUCIL) 58.6 % powder, Take 1 packet by mouth daily.    Reviewed prior external information including notes and imaging from  primary care provider As well as notes that were available from care everywhere and other healthcare systems.  Past medical history, social, surgical and family history all reviewed in electronic medical record.  No pertanent information unless stated regarding to the chief complaint.   Review of Systems:  No headache, visual changes, nausea, vomiting, diarrhea, constipation, dizziness, abdominal pain, skin rash, fevers, chills, night sweats, weight loss, swollen lymph nodes, body aches, joint swelling, chest pain, shortness of breath, mood changes. POSITIVE muscle aches  Objective  There were no vitals taken for this visit.   General: No apparent distress alert and oriented x3 mood and affect normal, dressed appropriately.  HEENT: Pupils equal, extraocular movements intact  Respiratory: Patient's speak in full sentences and does not appear short of breath  Cardiovascular: No lower extremity edema, non tender, no erythema  Bilateral shoulder show the patient does have some hypertrophy of the acromioclavicular joint bilaterally.  Patient does have a positive crossover.  The patient does have a positive O'Brien's on the right side.  Limited muscular skeletal ultrasound was performed and interpreted by Antoine Primas, M  Limited ultrasound of patient's shoulder shows that there is a chronic calcific change of the posterior labrum on the right side with some degenerative changes of the supraspinatus.  No true tear or retraction noted.  Patient does have severe narrowing of the acromioclavicular joint. 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  Verbal informed consent obtained.  Time-out conducted.  Noted no overlying erythema, induration, or other signs of local infection.  Skin prepped in a sterile fashion.  Local anesthesia: Topical Ethyl chloride.  With sterile technique and under real time ultrasound guidance: With a 25-gauge half inch needle injected with 0.5 cc of 0.5% Marcaine and 0.5 cc of Kenalog 40 mg/mL Completed without difficulty  Pain immediately improved suggesting accurate placement of the medication.  Advised to call if fevers/chills, erythema, induration, drainage, or persistent bleeding.  Impression: Technically successful ultrasound guided injection.   Procedure: Real-time Ultrasound Guided Injection of left acromioclavicular joint Device: GE Logiq Q7 Ultrasound guided injection is preferred  based studies that show increased duration, increased effect, greater accuracy, decreased procedural pain, increased response rate, and decreased cost with ultrasound guided versus blind injection.  Verbal informed consent obtained.  Time-out conducted.  Noted no overlying erythema, induration, or other signs of local infection.  Skin prepped in a sterile fashion.  Local anesthesia: Topical Ethyl chloride.  With sterile technique and under real time ultrasound guidance: With a 25-gauge half inch needle injected with 0.5 cc of 0.5% Marcaine and 0.5 cc of Kenalog 40 mg/mL Completed without difficulty  Advised to call if fevers/chills, erythema, induration, drainage, or persistent bleeding.  Impression: Technically successful ultrasound guided injection.   Impression and Recommendations:      The above documentation has been reviewed and is accurate and complete Judi Saa, DO

## 2023-03-12 ENCOUNTER — Encounter: Payer: Self-pay | Admitting: Family Medicine

## 2023-03-12 ENCOUNTER — Ambulatory Visit: Payer: Medicare Other | Admitting: Family Medicine

## 2023-03-12 ENCOUNTER — Other Ambulatory Visit: Payer: Self-pay

## 2023-03-12 VITALS — BP 120/72 | HR 62 | Ht 72.0 in | Wt 173.0 lb

## 2023-03-12 DIAGNOSIS — M19011 Primary osteoarthritis, right shoulder: Secondary | ICD-10-CM

## 2023-03-12 DIAGNOSIS — M19012 Primary osteoarthritis, left shoulder: Secondary | ICD-10-CM

## 2023-03-12 DIAGNOSIS — M25512 Pain in left shoulder: Secondary | ICD-10-CM

## 2023-03-12 DIAGNOSIS — M25511 Pain in right shoulder: Secondary | ICD-10-CM

## 2023-03-12 NOTE — Patient Instructions (Signed)
Injection in shoulders Do prescribed exercises at least 3x a week See you again in 6-8 weeks

## 2023-03-12 NOTE — Assessment & Plan Note (Signed)
Bilateral injection given today and tolerated the procedure well.  Do think that this is contributing to some of the limited range of motion and some of the pain that patient is having.  Discussed with patient about icing regimen and home exercises, discussed posture and ergonomics otherwise.  Patient does have some underlying degenerative changes of the supraspinatus on the right side that we will continue to monitor.  Worsening pain will consider the possibility of advanced imaging but hopefully patient will respond well to this and conservative home exercises.

## 2023-04-02 ENCOUNTER — Other Ambulatory Visit: Payer: Managed Care, Other (non HMO)

## 2023-04-09 ENCOUNTER — Encounter: Payer: Managed Care, Other (non HMO) | Admitting: Family Medicine

## 2023-04-10 ENCOUNTER — Ambulatory Visit: Payer: Managed Care, Other (non HMO) | Admitting: Family Medicine

## 2023-04-17 NOTE — Progress Notes (Unsigned)
[facility] [address] [place] [address] Phone: [phone] Subjective:   [facility], am serving as a scribe for Dr. Antoine Robles.  I'm seeing this patient by the request  of:  Benjamin Boyden, MD  CC: shoulder pain   [id number]  03/12/2023 Bilateral injection given today and tolerated the procedure well. Do think that this is contributing to some of the limited range of motion and some of the pain that patient is having. Discussed with patient about icing regimen and home exercises, discussed posture and ergonomics otherwise. Patient does have some underlying degenerative changes of the supraspinatus on the right side that we Benjamin Robles continue to monitor. Worsening pain Benjamin Robles consider the possibility of advanced imaging but hopefully patient Benjamin Robles respond well to this and conservative home exercises.   Updated 04/21/2023 Benjamin Robles is a 67 y.o. male coming in with complaint of B shoulder pain. Injections were helpful. R shoulder pain over superior aspect still present with abduction.   Chronic lower back pain. Walking increases his pain in L side of back. When tightening core his back Benjamin Robles feel better. Denies any radiating symptoms. Was seeing chiro was is not going to see him any longer.      Past Medical History:  Diagnosis Date   Allergy    SINUS ALLEGIES   Anticoagulant disorder (HCC)    Anxiety    BPH (benign prostatic hypertrophy)    mild   Bradycardia    Cancer (HCC)    CLL (chronic lymphocytic leukemia) (HCC)    Depression    GERD (gastroesophageal reflux disease)    History of colon polyps    normal colonoscopy 2012   Pain of right sternoclavicular joint    MRI 06/2012 showing ?erosive arthritis   Umbilical hernia    Past Surgical History:  Procedure Laterality Date   COLONOSCOPY  08/07/2011   WNL, rpt 10 yrs   COLONOSCOPY  12/2021   HP x1, rpt 10 yrs Benjamin Robles)   ESOPHAGOGASTRODUODENOSCOPY  12/2021   WNL Benjamin Robles)    HERNIA REPAIR     x2   UPPER GASTROINTESTINAL ENDOSCOPY  08/07/2011   chronic superficial gastritis, oozing nodule clipped, neg H pylori   Social History   Socioeconomic History   Marital status: Married    Spouse name: Not on file   Number of children: 2   Years of education: Not on file   Highest education level: Not on file  Occupational History   Occupation: Charity fundraiser: [employer]  Tobacco Use   Smoking status: Former    Current packs/day: 0.00    Average packs/day: 0.5 packs/day for 7.0 years (3.5 ttl pk-yrs)    Types: Cigarettes    Start date: 54    Quit date: 1983    Years since quitting: 41.6   Smokeless tobacco: Never  Substance and Sexual Activity   Alcohol use: Yes    Comment: 1 beer/day   Drug use: No   Sexual activity: Yes  Other Topics Concern   Not on file  Social History Narrative   "Benjamin Robles"   Lives with wife, 2 cats and 2 dogs, grown children   Occupation: Orthoptist support   Activity: no regular exercise routine   Diet: good water, fruits/vegetables daily   Social Determinants of Health   Financial Resource Strain: Not on file  Food Insecurity: Not on file  Transportation Needs: Not on file  Physical Activity: Not on file  Stress: Not on file  Social Connections: Not on file   Allergies  Allergen Reactions   Amoxicillin-Pot Clavulanate     REACTION: achey, itch   Family History  Problem Relation Age of Onset   Hypertension Mother    Depression Father    Prostate cancer Father 75       slow growing   CAD Neg Hx    Stroke Neg Hx    Diabetes Neg Hx    Colon cancer Neg Hx    Rectal cancer Neg Hx    Stomach cancer Neg Hx      Current Outpatient Medications (Cardiovascular):    atorvastatin (LIPITOR) 20 MG tablet, Take 1 tablet (20 mg total) by mouth daily.   sildenafil (VIAGRA) 100 MG tablet, Take 0.5-1 tablets (50-100 mg total) by mouth as needed for erectile dysfunction.  Current Outpatient Medications  (Respiratory):    fluticasone (FLONASE) 50 MCG/ACT nasal spray, USE 2 SPRAYS NASALY DAILY   Current Outpatient Medications (Hematological):    cyanocobalamin (V-R VITAMIN B-12) 500 MCG tablet, Take 1 tablet (500 mcg total) by mouth daily.  Current Outpatient Medications (Other):    calcium carbonate (TUMS - DOSED IN MG ELEMENTAL CALCIUM) 500 MG chewable tablet, Chew 1 tablet by mouth as needed.     Cholecalciferol (VITAMIN D3 PO), Take 1 capsule by mouth daily.   esomeprazole (NEXIUM) 20 MG capsule, Take 1 capsule (20 mg total) by mouth daily at 12 noon.   magnesium 30 MG tablet, Take 30 mg by mouth daily.   multivitamin (THERAGRAN) per tablet, Take 1 tablet by mouth daily.     psyllium (METAMUCIL) 58.6 % powder, Take 1 packet by mouth daily.    Reviewed prior external information including notes and imaging from  primary care provider As well as notes that were available from care everywhere and other healthcare systems.  Past medical history, social, surgical and family history all reviewed in electronic medical record.  No pertanent information unless stated regarding to the chief complaint.   Review of Systems:  No headache, visual changes, nausea, vomiting, diarrhea, constipation, dizziness, abdominal pain, skin rash, fevers, chills, night sweats, weight loss, swollen lymph nodes, body aches, joint swelling, chest pain, shortness of breath, mood changes. POSITIVE muscle aches  Objective  Blood pressure 122/78, pulse 66, height 6' (1.829 m), SpO2 97%.   General: No apparent distress alert and oriented x3 mood and affect normal, dressed appropriately.  HEENT: Pupils equal, extraocular movements intact  Respiratory: Patient's speak in full sentences and does not appear short of breath  Cardiovascular: No lower extremity edema, non tender, no erythema  Shoulder exam shows  impingement minorly on the right side. Patient low back tender to palpation over the sacroiliac joint.  Patient  still has tightness noted on the sacroiliac joint as stated.  Low back does have some mild loss lordosis.  Tightness with Pearlean Brownie left greater than right  Limited muscular skeletal ultrasound was performed and interpreted by Benjamin Robles, M   Limited ultrasound still shows the arthritic changes noted of the acromioclavicular joint on the right side but not as severe. Patient still has calcific changes noted of the posterior labrum on the right shoulder as well which is fairly significant. Impression: Improvement in the acromioclavicular joint but continued labral pathology of the right shoulder.  Osteopathic findings Sacrum left on left   Impression and Recommendations:    The above documentation has been reviewed and is accurate and complete Judi Saa, DO

## 2023-04-21 ENCOUNTER — Ambulatory Visit: Payer: Medicare Other | Admitting: Family Medicine

## 2023-04-21 ENCOUNTER — Other Ambulatory Visit: Payer: Self-pay

## 2023-04-21 ENCOUNTER — Encounter: Payer: Self-pay | Admitting: Family Medicine

## 2023-04-21 VITALS — BP 122/78 | HR 66 | Ht 72.0 in

## 2023-04-21 DIAGNOSIS — M19011 Primary osteoarthritis, right shoulder: Secondary | ICD-10-CM

## 2023-04-21 DIAGNOSIS — M25511 Pain in right shoulder: Secondary | ICD-10-CM

## 2023-04-21 DIAGNOSIS — M533 Sacrococcygeal disorders, not elsewhere classified: Secondary | ICD-10-CM

## 2023-04-21 DIAGNOSIS — G8929 Other chronic pain: Secondary | ICD-10-CM

## 2023-04-21 DIAGNOSIS — M25512 Pain in left shoulder: Secondary | ICD-10-CM

## 2023-04-21 DIAGNOSIS — M9904 Segmental and somatic dysfunction of sacral region: Secondary | ICD-10-CM | POA: Diagnosis not present

## 2023-04-21 DIAGNOSIS — M19012 Primary osteoarthritis, left shoulder: Secondary | ICD-10-CM

## 2023-04-21 NOTE — Assessment & Plan Note (Signed)
Discussed HEP Discussed which activities to do and which ones to avoid.  Discussed which activities to do and which ones to avoid.  Increase activity slowly.  Follow-up with me again in 6 to 8 weeks I do believe that patient still has some labral pathology of the right shoulder that is likely contributing as well based on the ultrasound.  May need injections in the glenohumeral joint if this continues

## 2023-04-21 NOTE — Patient Instructions (Signed)
Good to see you  Ice 20 minutes 2 times daily. Usually after activity and before bed. Try some of the core strength exercises Watch the shoulder See me again in 6-8 weeks

## 2023-04-21 NOTE — Assessment & Plan Note (Signed)
   Decision today to treat with OMT was based on Physical Exam  After verbal consent patient was treated with HVLA, ME, FPR techniques in sacral areas, all areas are chronic   Patient tolerated the procedure well with improvement in symptoms  Patient given exercises, stretches and lifestyle modifications  See medications in patient instructions if given  Patient will follow up in 4-8 weeks

## 2023-04-21 NOTE — Assessment & Plan Note (Signed)
Seems to be more of a sacroiliac joint dysfunction noted.  Discussed which activities to do, discussed Pearlean Brownie.  Discussed which activities to avoid.  Follow-up again in 6 to 8 weeks

## 2023-05-13 ENCOUNTER — Inpatient Hospital Stay (HOSPITAL_BASED_OUTPATIENT_CLINIC_OR_DEPARTMENT_OTHER): Payer: Medicare Other | Admitting: Oncology

## 2023-05-13 ENCOUNTER — Inpatient Hospital Stay: Payer: Medicare Other | Attending: Oncology

## 2023-05-13 ENCOUNTER — Encounter: Payer: Self-pay | Admitting: Oncology

## 2023-05-13 VITALS — BP 132/89 | HR 70 | Temp 96.0°F | Resp 18 | Wt 173.8 lb

## 2023-05-13 DIAGNOSIS — C9110 Chronic lymphocytic leukemia of B-cell type not having achieved remission: Secondary | ICD-10-CM

## 2023-05-13 DIAGNOSIS — M25519 Pain in unspecified shoulder: Secondary | ICD-10-CM | POA: Diagnosis not present

## 2023-05-13 DIAGNOSIS — R161 Splenomegaly, not elsewhere classified: Secondary | ICD-10-CM | POA: Insufficient documentation

## 2023-05-13 DIAGNOSIS — Z8042 Family history of malignant neoplasm of prostate: Secondary | ICD-10-CM | POA: Insufficient documentation

## 2023-05-13 DIAGNOSIS — Z87891 Personal history of nicotine dependence: Secondary | ICD-10-CM | POA: Diagnosis not present

## 2023-05-13 DIAGNOSIS — R5382 Chronic fatigue, unspecified: Secondary | ICD-10-CM | POA: Diagnosis not present

## 2023-05-13 LAB — CBC WITH DIFFERENTIAL (CANCER CENTER ONLY)
Abs Immature Granulocytes: 0.08 10*3/uL — ABNORMAL HIGH (ref 0.00–0.07)
Basophils Absolute: 0.1 10*3/uL (ref 0.0–0.1)
Basophils Relative: 0 %
Eosinophils Absolute: 0.1 10*3/uL (ref 0.0–0.5)
Eosinophils Relative: 0 %
HCT: 41.3 % (ref 39.0–52.0)
Hemoglobin: 13.8 g/dL (ref 13.0–17.0)
Immature Granulocytes: 1 %
Lymphocytes Relative: 55 %
Lymphs Abs: 9.2 10*3/uL — ABNORMAL HIGH (ref 0.7–4.0)
MCH: 30.7 pg (ref 26.0–34.0)
MCHC: 33.4 g/dL (ref 30.0–36.0)
MCV: 92 fL (ref 80.0–100.0)
Monocytes Absolute: 0.6 10*3/uL (ref 0.1–1.0)
Monocytes Relative: 3 %
Neutro Abs: 6.9 10*3/uL (ref 1.7–7.7)
Neutrophils Relative %: 41 %
Platelet Count: 283 10*3/uL (ref 150–400)
RBC: 4.49 MIL/uL (ref 4.22–5.81)
RDW: 12.7 % (ref 11.5–15.5)
Smear Review: NORMAL
WBC Count: 16.9 10*3/uL — ABNORMAL HIGH (ref 4.0–10.5)
nRBC: 0 % (ref 0.0–0.2)

## 2023-05-13 LAB — CMP (CANCER CENTER ONLY)
ALT: 25 U/L (ref 0–44)
AST: 23 U/L (ref 15–41)
Albumin: 4.3 g/dL (ref 3.5–5.0)
Alkaline Phosphatase: 70 U/L (ref 38–126)
Anion gap: 8 (ref 5–15)
BUN: 21 mg/dL (ref 8–23)
CO2: 24 mmol/L (ref 22–32)
Calcium: 9.1 mg/dL (ref 8.9–10.3)
Chloride: 102 mmol/L (ref 98–111)
Creatinine: 1.19 mg/dL (ref 0.61–1.24)
GFR, Estimated: >60 mL/min (ref >60)
Glucose, Bld: 130 mg/dL — ABNORMAL HIGH (ref 70–99)
Potassium: 4.8 mmol/L (ref 3.5–5.1)
Sodium: 134 mmol/L — ABNORMAL LOW (ref 135–145)
Total Bilirubin: 1 mg/dL (ref 0.3–1.2)
Total Protein: 6.7 g/dL (ref 6.5–8.1)

## 2023-05-13 LAB — LACTATE DEHYDROGENASE: LDH: 112 U/L (ref 98–192)

## 2023-05-13 NOTE — Assessment & Plan Note (Signed)
Mild splenomegaly.  Monitor.

## 2023-05-13 NOTE — Progress Notes (Signed)
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 Labs are reviewed and discussed with patient. Wbc/lymphocyte counts gradually increase Physical examination did not appreciate significant lymphadenopathy. No significant constitutional symptoms.  Continue watchful waiting.    Splenomegaly Mild splenomegaly.  Monitor.   Orders Placed This Encounter  Procedures   CBC with Differential (Cancer Center Only)    Standing Status:   Future    Standing Expiration Date:   05/12/2024   CMP (Cancer Center only)    Standing Status:   Future    Standing Expiration Date:   05/12/2024   Lactate dehydrogenase    Standing Status:   Future    Standing Expiration Date:   05/12/2024   Follow up in 6 months.  All questions were answered. The patient knows to call the clinic with any problems, questions or concerns.  Benjamin Patience, MD, PhD [facility] Hematology Oncology 05/13/2023   Benjamin Boyden, MD  CHIEF COMPLAINTS/REASON FOR VISIT:  Follow-up for CLL  HISTORY OF PRESENTING ILLNESS:  67 y.o.male presents for CLL.  Oncology History  CLL (chronic lymphocytic leukemia) (HCC)  01/08/2022 Initial Diagnosis   CLL (chronic lymphocytic leukemia) (HCC)   01/08/2022 Cancer Staging   Staging form: Chronic Lymphocytic Leukemia / Small Lymphocytic Lymphoma, AJCC 8th Edition - Clinical: Modified Rai Stage II (Modified Rai risk: Intermediate, Lymphocytosis: Present, Adenopathy: Absent, Organomegaly: Present, Anemia: Absent, Thrombocytopenia: Absent) - Signed by Benjamin Patience, MD on 05/12/2022 Stage prefix: Initial diagnosis    01/27/2022 Imaging   US abdomen complete showed Borderline size of the spleen.      Former smoker, he stopped in the 1980s.  He drinks 1 beer per day.  Recently has  tried to cut back on alcohol consumption. Patient has some chronic fatigue which is unchanged. + Arthralgia-shoulder pain   INTERVAL HISTORY Benjamin Robles is a 67 y.o. male who has above history reviewed by me today presents for follow up visit to review results. Patient has no new complaints.  Accompanied by wife. Increased fatigue.  Denies fever, night sweats.  Patient has lost 6 pounds since last visit. His weight has been stable.   Review of Systems  Constitutional:  Positive for fatigue. Negative for appetite change, chills, fever and unexpected weight change.  HENT:   Negative for hearing loss and voice change.   Eyes:  Negative for eye problems and icterus.  Respiratory:  Negative for chest tightness, cough and shortness of breath.   Cardiovascular:  Negative for chest pain and leg swelling.  Gastrointestinal:  Negative for abdominal distention and abdominal pain.  Endocrine: Negative for hot flashes.  Genitourinary:  Negative for difficulty urinating, dysuria and frequency.   Musculoskeletal:  Positive for arthralgias.  Skin:  Negative for itching and rash.  Neurological:  Negative for light-headedness and numbness.  Hematological:  Negative for adenopathy. Does not bruise/bleed easily.  Psychiatric/Behavioral:  Negative for confusion.     MEDICAL HISTORY:  Past Medical History:  Diagnosis Date   Allergy    SINUS ALLEGIES   Anticoagulant disorder (HCC)    Anxiety    BPH (benign prostatic hypertrophy)    mild   Bradycardia    Cancer (HCC)    CLL (chronic lymphocytic leukemia) (HCC)    Depression    GERD (gastroesophageal reflux  disease)    History of colon polyps    normal colonoscopy 2012   Pain of right sternoclavicular joint    MRI 06/2012 showing ?erosive arthritis   Umbilical hernia     SURGICAL HISTORY: Past Surgical History:  Procedure Laterality Date   COLONOSCOPY  08/07/2011   WNL, rpt 10 yrs   COLONOSCOPY  12/2021   HP x1, rpt 10 yrs Marina Goodell)    ESOPHAGOGASTRODUODENOSCOPY  12/2021   WNL Marina Goodell)   HERNIA REPAIR     x2   UPPER GASTROINTESTINAL ENDOSCOPY  08/07/2011   chronic superficial gastritis, oozing nodule clipped, neg H pylori    SOCIAL HISTORY: Social History   Socioeconomic History   Marital status: Married    Spouse name: Not on file   Number of children: 2   Years of education: Not on file   Highest education level: Not on file  Occupational History   Occupation: Charity fundraiser: [employer]  Tobacco Use   Smoking status: Former    Current packs/day: 0.00    Average packs/day: 0.5 packs/day for 7.0 years (3.5 ttl pk-yrs)    Types: Cigarettes    Start date: 65    Quit date: 1983    Years since quitting: 41.7   Smokeless tobacco: Never  Substance and Sexual Activity   Alcohol use: Yes    Comment: 1 beer/day   Drug use: No   Sexual activity: Yes  Other Topics Concern   Not on file  Social History Narrative   "Benjamin Robles"   Lives with wife, 2 cats and 2 dogs, grown children   Occupation: Orthoptist support   Activity: no regular exercise routine   Diet: good water, fruits/vegetables daily   Social Determinants of Health   Financial Resource Strain: Not on file  Food Insecurity: Not on file  Transportation Needs: Not on file  Physical Activity: Not on file  Stress: Not on file  Social Connections: Not on file  Intimate Partner Violence: Not on file    FAMILY HISTORY: Family History  Problem Relation Age of Onset   Hypertension Mother    Depression Father    Prostate cancer Father 60       slow growing   CAD Neg Hx    Stroke Neg Hx    Diabetes Neg Hx    Colon cancer Neg Hx    Rectal cancer Neg Hx    Stomach cancer Neg Hx     ALLERGIES:  is allergic to amoxicillin-pot clavulanate.  MEDICATIONS:  Current Outpatient Medications  Medication Sig Dispense Refill   atorvastatin (LIPITOR) 20 MG tablet Take 1 tablet (20 mg total) by mouth daily. 90 tablet 3   calcium carbonate  (TUMS - DOSED IN MG ELEMENTAL CALCIUM) 500 MG chewable tablet Chew 1 tablet by mouth as needed.       Cholecalciferol (VITAMIN D3 PO) Take 1 capsule by mouth daily.     cyanocobalamin (V-R VITAMIN B-12) 500 MCG tablet Take 1 tablet (500 mcg total) by mouth daily.     esomeprazole (NEXIUM) 20 MG capsule Take 1 capsule (20 mg total) by mouth daily at 12 noon.     fluticasone (FLONASE) 50 MCG/ACT nasal spray USE 2 SPRAYS NASALY DAILY 48 g 3   magnesium 30 MG tablet Take 30 mg by mouth daily.     multivitamin (THERAGRAN) per tablet Take 1 tablet by mouth daily.       psyllium (METAMUCIL) 58.6 % powder Take 1  packet by mouth daily.      sildenafil (VIAGRA) 100 MG tablet Take 0.5-1 tablets (50-100 mg total) by mouth as needed for erectile dysfunction. 10 tablet 6   No current facility-administered medications for this visit.     PHYSICAL EXAMINATION: ECOG PERFORMANCE STATUS: 0 - Asymptomatic Vitals:   05/13/23 1324  BP: 132/89  Pulse: 70  Resp: 18  Temp: (!) 96 F (35.6 C)  SpO2: 100%   Filed Weights   05/13/23 1324  Weight: 173 lb 12.8 oz (78.8 kg)    Physical Exam Constitutional:      General: He is not in acute distress. HENT:     Head: Normocephalic and atraumatic.  Eyes:     General: No scleral icterus. Cardiovascular:     Rate and Rhythm: Normal rate.  Pulmonary:     Effort: Pulmonary effort is normal. No respiratory distress.     Breath sounds: No wheezing.  Abdominal:     General: Bowel sounds are normal. There is no distension.     Palpations: Abdomen is soft.     Comments: Palpable liver edge just below costal margin.  Musculoskeletal:        General: No deformity. Normal range of motion.     Cervical back: Normal range of motion.  Skin:    General: Skin is warm and dry.  Neurological:     Mental Status: He is alert and oriented to person, place, and time. Mental status is at baseline.     Cranial Nerves: No cranial nerve deficit.  Psychiatric:        Mood  and Affect: Mood normal.     LABORATORY DATA:  I have reviewed the data as listed    Latest Ref Rng & Units 05/13/2023    1:11 PM 11/10/2022   12:50 PM 05/12/2022    1:05 PM  CBC  WBC 4.0 - 10.5 K/uL 16.9  12.5  14.6   Hemoglobin 13.0 - 17.0 g/dL 84.1  66.0  63.0   Hematocrit 39.0 - 52.0 % 41.3  41.8  42.8   Platelets 150 - 400 K/uL 283  292  286       Latest Ref Rng & Units 05/13/2023    1:11 PM 11/10/2022   12:50 PM 05/12/2022    1:05 PM  CMP  Glucose 70 - 99 mg/dL 160  109  323   BUN 8 - 23 mg/dL 21  15  21    Creatinine 0.61 - 1.24 mg/dL 5.57  3.22  0.25   Sodium 135 - 145 mmol/L 134  137  138   Potassium 3.5 - 5.1 mmol/L 4.8  4.2  4.3   Chloride 98 - 111 mmol/L 102  104  103   CO2 22 - 32 mmol/L 24  26  28    Calcium 8.9 - 10.3 mg/dL 9.1  9.0  8.9   Total Protein 6.5 - 8.1 g/dL 6.7  7.2  7.3   Total Bilirubin 0.3 - 1.2 mg/dL 1.0  0.7  0.7   Alkaline Phos 38 - 126 U/L 70  65  72   AST 15 - 41 U/L 23  24  20    ALT 0 - 44 U/L 25  20  20    ;  RADIOGRAPHIC STUDIES: I have personally reviewed the radiological images as listed and agreed with the findings in the report. [place] LIMITED JOINT SPACE STRUCTURES UP BILAT(NO LINKED CHARGES)  Result Date: 04/21/2023 Limited muscular skeletal ultrasound was performed and interpreted  by Antoine Primas, M  Limited ultrasound still shows the arthritic changes noted of the acromioclavicular joint on the right side but not as severe. Patient still has calcific changes noted of the posterior labrum on the right shoulder as well which is fairly significant. Impression: Improvement in the acromioclavicular joint but continued labral pathology of the right shoulder.

## 2023-05-13 NOTE — Assessment & Plan Note (Addendum)
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 Labs are reviewed and discussed with patient. Wbc/lymphocyte counts gradually increase Physical examination did not appreciate significant lymphadenopathy. No significant constitutional symptoms.  Continue watchful waiting.

## 2023-06-04 NOTE — Progress Notes (Signed)
[name] [address] [place] [address] Phone: [phone] Subjective:   Benjamin Robles, am serving as a scribe for Dr. Antoine Primas.  I'm seeing this patient by the request  of:  [name], MD  CC:   [id number]  Benjamin Robles is a 67 y.o. male coming in with complaint of back and neck pain. OMT on 04/21/2023. Patient states manipulation last visit helped a little. More aggravating on R side lower back today. Doing better. No new concerns.  Medications patient has been prescribed:   Taking:     Since we have seen patient they have seen oncology for CLL in September.  No significant change in treatments with mostly watchman at this point.    Reviewed prior external information including notes and imaging from previsou exam, outside providers and external EMR if available.   As well as notes that were available from care everywhere and other healthcare systems.  Past medical history, social, surgical and family history all reviewed in electronic medical record.  No pertanent information unless stated regarding to the chief complaint.   Past Medical History:  Diagnosis Date   Allergy    SINUS ALLEGIES   Anticoagulant disorder (HCC)    Anxiety    BPH (benign prostatic hypertrophy)    mild   Bradycardia    Cancer (HCC)    CLL (chronic lymphocytic leukemia) (HCC)    Depression    GERD (gastroesophageal reflux disease)    History of colon polyps    normal colonoscopy 2012   Pain of right sternoclavicular joint    MRI 06/2012 showing ?erosive arthritis   Umbilical hernia     Allergies  Allergen Reactions   Amoxicillin-Pot Clavulanate     REACTION: achey, itch     Review of Systems:  No headache, visual changes, nausea, vomiting, diarrhea, constipation, dizziness, abdominal pain, skin rash, fevers, chills, night sweats, weight loss, swollen lymph nodes, body aches, joint swelling, chest pain, shortness of breath, mood  changes. POSITIVE muscle aches  Objective  Blood pressure 114/68, pulse 64, height 6' (1.829 m), weight 177 lb (80.3 kg), SpO2 97%.   General: No apparent distress alert and oriented x3 mood and affect normal, dressed appropriately.  HEENT: Pupils equal, extraocular movements intact  Respiratory: Patient's speak in full sentences and does not appear short of breath  Cardiovascular: No lower extremity edema, non tender, no erythema  Patient does have some mild loss lordosis noted.  Some tenderness to palpation in the paraspinal musculature.  Nothing was severe at the moment.  Osteopathic findings  C2 flexed rotated and side bent right C7 flexed rotated and side bent left T3 extended rotated and side bent right inhaled rib T7 extended rotated and side bent left L1 flexed rotated and side bent right L5 flexed rotated and side bent left Sacrum right on right     Assessment and Plan:  SI (sacroiliac) joint dysfunction Low back continues to have some tightness noted.  Is responding extremely well though to osteopathic manipulation.  Discussed posture and ergonomics otherwise.  Increase activity slowly no significant changes in management today.  Follow-up again in 6 to 8 weeks otherwise.     Nonallopathic problems  Decision today to treat with OMT was based on Physical Exam  After verbal consent patient was treated with HVLA, ME, FPR techniques in cervical, rib, thoracic, lumbar, and sacral  areas  Patient tolerated the procedure well with improvement in symptoms  Patient given  exercises, stretches and lifestyle modifications  See medications in patient instructions if given  Patient will follow up in 4-8 weeks    The above documentation has been reviewed and is accurate and complete Judi Saa, DO          Note: This dictation was prepared with Dragon dictation along with smaller phrase technology. Any transcriptional errors that result from this process are  unintentional.

## 2023-06-09 ENCOUNTER — Ambulatory Visit: Payer: Medicare Other | Admitting: Family Medicine

## 2023-06-09 ENCOUNTER — Encounter: Payer: Self-pay | Admitting: Family Medicine

## 2023-06-09 VITALS — BP 114/68 | HR 64 | Ht 72.0 in | Wt 177.0 lb

## 2023-06-09 DIAGNOSIS — M9904 Segmental and somatic dysfunction of sacral region: Secondary | ICD-10-CM | POA: Diagnosis not present

## 2023-06-09 DIAGNOSIS — M9908 Segmental and somatic dysfunction of rib cage: Secondary | ICD-10-CM | POA: Diagnosis not present

## 2023-06-09 DIAGNOSIS — M533 Sacrococcygeal disorders, not elsewhere classified: Secondary | ICD-10-CM

## 2023-06-09 DIAGNOSIS — M9903 Segmental and somatic dysfunction of lumbar region: Secondary | ICD-10-CM | POA: Diagnosis not present

## 2023-06-09 DIAGNOSIS — M9901 Segmental and somatic dysfunction of cervical region: Secondary | ICD-10-CM

## 2023-06-09 DIAGNOSIS — M9902 Segmental and somatic dysfunction of thoracic region: Secondary | ICD-10-CM

## 2023-06-09 NOTE — Assessment & Plan Note (Signed)
Low back continues to have some tightness noted.  Is responding extremely well though to osteopathic manipulation.  Discussed posture and ergonomics otherwise.  Increase activity slowly no significant changes in management today.  Follow-up again in 6 to 8 weeks otherwise.

## 2023-06-09 NOTE — Patient Instructions (Signed)
Good to see you! Thanks for showing me pics See you again in 2 months

## 2023-08-10 ENCOUNTER — Encounter: Payer: Self-pay | Admitting: Family Medicine

## 2023-08-10 ENCOUNTER — Ambulatory Visit: Payer: Medicare Other | Admitting: Family Medicine

## 2023-08-10 VITALS — BP 134/68 | HR 68 | Ht 72.0 in | Wt 181.0 lb

## 2023-08-10 DIAGNOSIS — M9902 Segmental and somatic dysfunction of thoracic region: Secondary | ICD-10-CM

## 2023-08-10 DIAGNOSIS — M9903 Segmental and somatic dysfunction of lumbar region: Secondary | ICD-10-CM | POA: Diagnosis not present

## 2023-08-10 DIAGNOSIS — M533 Sacrococcygeal disorders, not elsewhere classified: Secondary | ICD-10-CM

## 2023-08-10 DIAGNOSIS — M9904 Segmental and somatic dysfunction of sacral region: Secondary | ICD-10-CM

## 2023-08-10 DIAGNOSIS — M9908 Segmental and somatic dysfunction of rib cage: Secondary | ICD-10-CM

## 2023-08-10 NOTE — Assessment & Plan Note (Signed)
Sacroiliac dysfunction still noted.  Discussed with patient about icing regimen, home exercises.  Continue to work on core strengthening exercises.  Discussed avoiding certain activities that I think will cause more discomfort.  Follow-up with me again in 6 to 8 weeks otherwise.

## 2023-08-10 NOTE — Progress Notes (Signed)
[name] [address] [place] [address] Phone: [phone] Subjective:    I'm seeing this patient by the request  of:  [name], MD  CC: Back and neck pain  [id number]  Benjamin Robles is a 67 y.o. male coming in with complaint of back and neck pain. OMT on 06/09/2023. Patient states same per usual. No new concerns.  Medications patient has been prescribed:   Taking:         Reviewed prior external information including notes and imaging from previsou exam, outside providers and external EMR if available.   As well as notes that were available from care everywhere and other healthcare systems.  Past medical history, social, surgical and family history all reviewed in electronic medical record.  No pertanent information unless stated regarding to the chief complaint.   Past Medical History:  Diagnosis Date   Allergy    SINUS ALLEGIES   Anticoagulant disorder (HCC)    Anxiety    BPH (benign prostatic hypertrophy)    mild   Bradycardia    Cancer (HCC)    CLL (chronic lymphocytic leukemia) (HCC)    Depression    GERD (gastroesophageal reflux disease)    History of colon polyps    normal colonoscopy 2012   Pain of right sternoclavicular joint    MRI 06/2012 showing ?erosive arthritis   Umbilical hernia     Allergies  Allergen Reactions   Amoxicillin-Pot Clavulanate     REACTION: achey, itch     Review of Systems:  No headache, visual changes, nausea, vomiting, diarrhea, constipation, dizziness, abdominal pain, skin rash, fevers, chills, night sweats, weight loss, swollen lymph nodes, body aches, joint swelling, chest pain, shortness of breath, mood changes. POSITIVE muscle aches  Objective  Blood pressure 134/68, pulse 68, height 6' (1.829 m), weight 181 lb (82.1 kg), SpO2 98%.   General: No apparent distress alert and oriented x3 mood and affect normal, dressed appropriately.  HEENT: Pupils equal, extraocular  movements intact  Respiratory: Patient's speak in full sentences and does not appear short of breath  Cardiovascular: No lower extremity edema, non tender, no erythema  Gait relatively normal MSK:  Back low back still seems to be more tenderness on the left side of the back.  Seems to be from the L4-L5 area.  Negative straight leg test but does have a positive FABER test noted.  Osteopathic findings   T9 extended rotated and side bent left L2 flexed rotated and side bent right Sacrum left on left       Assessment and Plan:  SI (sacroiliac) joint dysfunction Sacroiliac dysfunction still noted.  Discussed with patient about icing regimen, home exercises.  Continue to work on core strengthening exercises.  Discussed avoiding certain activities that I think will cause more discomfort.  Follow-up with me again in 6 to 8 weeks otherwise.    Nonallopathic problems  Decision today to treat with OMT was based on Physical Exam  After verbal consent patient was treated with HVLA, ME, FPR techniques in  thoracic, lumbar, and sacral  areas  Patient tolerated the procedure well with improvement in symptoms  Patient given exercises, stretches and lifestyle modifications  See medications in patient instructions if given  Patient will follow up in 4-8 weeks     The above documentation has been reviewed and is accurate and complete Judi Saa, DO         Note: This dictation was prepared with Reubin Milan  dictation along with smaller phrase technology. Any transcriptional errors that result from this process are unintentional.

## 2023-08-10 NOTE — Patient Instructions (Signed)
See you again in 6-12 weeks

## 2023-08-11 ENCOUNTER — Ambulatory Visit: Payer: Medicare Other | Admitting: Family Medicine

## 2023-10-02 ENCOUNTER — Ambulatory Visit
Admission: EM | Admit: 2023-10-02 | Discharge: 2023-10-02 | Disposition: A | Discharge status: Home / Self Care | Payer: Medicare Other | Attending: Emergency Medicine | Admitting: Emergency Medicine

## 2023-10-02 DIAGNOSIS — J101 Influenza due to other identified influenza virus with other respiratory manifestations: Secondary | ICD-10-CM | POA: Diagnosis not present

## 2023-10-02 LAB — POC COVID19/FLU A&B COMBO
Covid Antigen, POC: NEGATIVE
Influenza A Antigen, POC: POSITIVE — AB
Influenza B Antigen, POC: NEGATIVE

## 2023-10-02 MED ORDER — OSELTAMIVIR PHOSPHATE 75 MG PO CAPS: 75.0000 mg | ORAL_CAPSULE | Freq: Two times a day (BID) | ORAL | 0 refills | Status: DC

## 2023-10-02 NOTE — Discharge Instructions (Signed)
 Take the Tamiflu as directed.  Follow-up with your primary care provider if your symptoms are not improving.

## 2023-10-02 NOTE — ED Provider Notes (Signed)
 Benjamin Robles    CSN: [id number] Arrival date & time: 10/02/23  9066      History   Chief Complaint Chief Complaint  Patient presents with   URI    HPI Benjamin Robles is a 68 y.o. male.  Patient presents with chills, postnasal drip, runny nose, sore throat, mild cough since yesterday.  No fever or shortness of breath.  No OTC medication taken today; took Tylenol  last night.  The history is provided by the patient and medical records.    Past Medical History:  Diagnosis Date   Allergy    SINUS ALLEGIES   Anticoagulant disorder (HCC)    Anxiety    BPH (benign prostatic hypertrophy)    mild   Bradycardia    Cancer (HCC)    CLL (chronic lymphocytic leukemia) (HCC)    Depression    GERD (gastroesophageal reflux disease)    History of colon polyps    normal colonoscopy 2012   Pain of right sternoclavicular joint    MRI 06/2012 showing ?erosive arthritis   Umbilical hernia     Patient Active Problem List   Diagnosis Date Noted   SI (sacroiliac) joint dysfunction 04/21/2023   Somatic dysfunction of sacral region 04/21/2023   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   Multiple pulmonary nodules 12/30/2022   Erectile dysfunction 12/10/2022   Welcome to Medicare preventive visit 12/09/2022   Advanced directives, counseling/discussion 12/09/2022   Family history of coronary artery disease 12/09/2022   Post-nasal drainage 09/17/2022   Splenomegaly 05/12/2022   Alcohol use 01/08/2022   CLL (chronic lymphocytic leukemia) (HCC) 01/08/2022   Renal insufficiency 12/04/2021   Low serum vitamin B12 11/14/2021   Skin lesion 11/14/2021   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   Benign prostatic hyperplasia    Encounter for general adult medical examination with abnormal findings 07/13/2012   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   Tinnitus 09/30/2010   IRRITABLE BOWEL SYNDROME 03/29/2010   Pure hypercholesterolemia 08/10/2007   Adjustment disorder  with mixed anxiety and depressed mood 05/12/2007   Allergic rhinitis 05/03/2007   GERD (gastroesophageal reflux disease) 05/03/2007    Past Surgical History:  Procedure Laterality Date   COLONOSCOPY  08/07/2011   WNL, rpt 10 yrs   COLONOSCOPY  12/2021   HP x1, rpt 10 yrs Oletta)   ESOPHAGOGASTRODUODENOSCOPY  12/2021   WNL Oletta)   HERNIA REPAIR     x2   UPPER GASTROINTESTINAL ENDOSCOPY  08/07/2011   chronic superficial gastritis, oozing nodule clipped, neg H pylori       Home Medications    Prior to Admission medications   Medication Sig Start Date End Date Taking? Authorizing Provider  oseltamivir  (TAMIFLU ) 75 MG capsule Take 1 capsule (75 mg total) by mouth every 12 (twelve) hours. 10/02/23  Yes Corlis Burnard DEL, NP  atorvastatin  (LIPITOR) 20 MG tablet Take 1 tablet (20 mg total) by mouth daily. 12/09/22   Rilla Baller, MD  calcium  carbonate (TUMS - DOSED IN MG ELEMENTAL CALCIUM ) 500 MG chewable tablet Chew 1 tablet by mouth as needed.      [provider]  Cholecalciferol (VITAMIN D3 PO) Take 1 capsule by mouth daily.    [provider]  cyanocobalamin  (V-R VITAMIN B-12) 500 MCG tablet Take 1 tablet (500 mcg total) by mouth daily. 01/02/17   Rilla Baller, MD  esomeprazole  (NEXIUM ) 20 MG capsule Take 1 capsule (20 mg total) by mouth daily at 12 noon. 12/21/19   Rilla Baller, MD  fluticasone  (  FLONASE ) 50 MCG/ACT nasal spray USE 2 SPRAYS NASALY DAILY 12/14/14   Rilla Baller, MD  magnesium  30 MG tablet Take 30 mg by mouth daily.    [provider]  multivitamin [facility]) per tablet Take 1 tablet by mouth daily.      [provider]  psyllium (METAMUCIL) 58.6 % powder Take 1 packet by mouth daily.     [provider]  sildenafil  (VIAGRA ) 100 MG tablet Take 0.5-1 tablets (50-100 mg total) by mouth as needed for erectile dysfunction. 12/09/22   Rilla Baller, MD    Family History Family History  Problem Relation Age of  Onset   Hypertension Mother    Depression Father    Prostate cancer Father 53       slow growing   CAD Neg Hx    Stroke Neg Hx    Diabetes Neg Hx    Colon cancer Neg Hx    Rectal cancer Neg Hx    Stomach cancer Neg Hx     Social History Social History   Tobacco Use   Smoking status: Former    Current packs/day: 0.00    Average packs/day: 0.5 packs/day for 7.0 years (3.5 ttl pk-yrs)    Types: Cigarettes    Start date: 27    Quit date: 74    Years since quitting: 42.1   Smokeless tobacco: Never  Substance Use Topics   Alcohol use: Yes    Comment: 1 beer/day   Drug use: No     Allergies   Amoxicillin-pot clavulanate   Review of Systems Review of Systems  Constitutional:  Positive for chills. Negative for fever.  HENT:  Positive for congestion, ear pain, postnasal drip, rhinorrhea and sore throat.   Respiratory:  Positive for cough. Negative for shortness of breath.      Physical Exam Triage Vital Signs ED Triage Vitals [10/02/23 0943]  Encounter Vitals Group     BP 138/85     Systolic BP Percentile      Diastolic BP Percentile      Pulse Rate 85     Resp 18     Temp 99.9 F (37.7 C)     Temp src      SpO2 96 %     Weight      Height      Head Circumference      Peak Flow      Pain Score      Pain Loc      Pain Education      Exclude from Growth Chart    No data found.  Updated Vital Signs BP 138/85   Pulse 85   Temp 99.9 F (37.7 C)   Resp 18   SpO2 96%   Visual Acuity Right Eye Distance:   Left Eye Distance:   Bilateral Distance:    Right Eye Near:   Left Eye Near:    Bilateral Near:     Physical Exam Constitutional:      General: He is not in acute distress. HENT:     Right Ear: Tympanic membrane normal.     Left Ear: Tympanic membrane normal.     Nose: Rhinorrhea present.     Mouth/Throat:     Mouth: Mucous membranes are moist.     Pharynx: Oropharynx is clear.  Cardiovascular:     Rate and Rhythm: Normal rate and  regular rhythm.     Heart sounds: Normal heart sounds.  Pulmonary:  Effort: Pulmonary effort is normal. No respiratory distress.     Breath sounds: Normal breath sounds.  Neurological:     Mental Status: He is alert.      UC Treatments / Results  Labs (all labs ordered are listed, but only abnormal results are displayed) Labs Reviewed  POC COVID19/FLU A&B COMBO - Abnormal; Notable for the following components:      Result Value   Influenza A Antigen, POC Positive (*)    All other components within normal limits    EKG   Radiology No results found.  Procedures Procedures (including critical care time)  Medications Ordered in UC Medications - No data to display  Initial Impression / Assessment and Plan / UC Course  I have reviewed the triage vital signs and the nursing notes.  Pertinent labs & imaging results that were available during my care of the patient were reviewed by me and considered in my medical decision making (see chart for details).    Influenza A.  Rapid flu positive for influenza A.  Lungs are clear and O2 sat is 96% on room air.  Treating today with Tamiflu .  Tylenol  as needed, rest, hydration.  Education provided on influenza.  ED precautions given.  Instructed him to follow-up with his PCP if he is not improving.  Patient agrees to plan of care.  Final Clinical Impressions(s) / UC Diagnoses   Final diagnoses:  Influenza A     Discharge Instructions      Take the Tamiflu  as directed.  Follow-up with your primary care provider if your symptoms are not improving.      ED Prescriptions     Medication Sig Dispense Auth. Provider   oseltamivir  (TAMIFLU ) 75 MG capsule Take 1 capsule (75 mg total) by mouth every 12 (twelve) hours. 10 capsule Corlis Burnard DEL, NP      PDMP not reviewed this encounter.   Corlis Burnard DEL, NP 10/02/23 1023

## 2023-10-02 NOTE — ED Triage Notes (Addendum)
 Patient to Urgent Care with complaints of nasal congestion/ drainage/ scratchy throat and irritation. Experienced some chills and possible fevers (99.5 max temp).   Symptoms started yesterday. Has some left sided ear soreness/ throat pain on Wednesday. Reports concerns d/t CLL.  Dose of tylenol  last night.

## 2023-10-04 ENCOUNTER — Ambulatory Visit
Admission: EM | Admit: 2023-10-04 | Discharge: 2023-10-04 | Disposition: A | Discharge status: Home / Self Care | Payer: Medicare Other

## 2023-10-04 DIAGNOSIS — J029 Acute pharyngitis, unspecified: Secondary | ICD-10-CM | POA: Diagnosis not present

## 2023-10-04 DIAGNOSIS — J101 Influenza due to other identified influenza virus with other respiratory manifestations: Secondary | ICD-10-CM

## 2023-10-04 DIAGNOSIS — H9203 Otalgia, bilateral: Secondary | ICD-10-CM

## 2023-10-04 NOTE — ED Provider Notes (Signed)
 [name]    CSN: [id number] Arrival date & time: 10/04/23  0859      History   Chief Complaint Chief Complaint  Patient presents with   Otalgia    HPI Benjamin Robles is a 68 y.o. male.  Patient presents with ear pain since last night.  The ear pain radiates to his throat.  He has ongoing mild cough.  No shortness of breath.  Patient was seen here yesterday and diagnosed with influenza; treated with Tamiflu .  The history is provided by the patient and medical records.    Past Medical History:  Diagnosis Date   Allergy    SINUS ALLEGIES   Anticoagulant disorder (HCC)    Anxiety    BPH (benign prostatic hypertrophy)    mild   Bradycardia    Cancer (HCC)    CLL (chronic lymphocytic leukemia) (HCC)    Depression    GERD (gastroesophageal reflux disease)    History of colon polyps    normal colonoscopy 2012   Pain of right sternoclavicular joint    MRI 06/2012 showing ?erosive arthritis   Umbilical hernia     Patient Active Problem List   Diagnosis Date Noted   SI (sacroiliac) joint dysfunction 04/21/2023   Somatic dysfunction of sacral region 04/21/2023   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   Multiple pulmonary nodules 12/30/2022   Erectile dysfunction 12/10/2022   Welcome to Medicare preventive visit 12/09/2022   Advanced directives, counseling/discussion 12/09/2022   Family history of coronary artery disease 12/09/2022   Post-nasal drainage 09/17/2022   Splenomegaly 05/12/2022   Alcohol use 01/08/2022   CLL (chronic lymphocytic leukemia) (HCC) 01/08/2022   Renal insufficiency 12/04/2021   Low serum vitamin B12 11/14/2021   Skin lesion 11/14/2021   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   Benign prostatic hyperplasia    Encounter for general adult medical examination with abnormal findings 07/13/2012   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   Tinnitus 09/30/2010   IRRITABLE BOWEL SYNDROME 03/29/2010   Pure  hypercholesterolemia 08/10/2007   Adjustment disorder with mixed anxiety and depressed mood 05/12/2007   Allergic rhinitis 05/03/2007   GERD (gastroesophageal reflux disease) 05/03/2007    Past Surgical History:  Procedure Laterality Date   COLONOSCOPY  08/07/2011   WNL, rpt 10 yrs   COLONOSCOPY  12/2021   HP x1, rpt 10 yrs Oletta)   ESOPHAGOGASTRODUODENOSCOPY  12/2021   WNL Oletta)   HERNIA REPAIR     x2   UPPER GASTROINTESTINAL ENDOSCOPY  08/07/2011   chronic superficial gastritis, oozing nodule clipped, neg H pylori       Home Medications    Prior to Admission medications   Medication Sig Start Date End Date Taking? Authorizing Provider  atorvastatin  (LIPITOR) 20 MG tablet Take 1 tablet (20 mg total) by mouth daily. 12/09/22   Rilla Baller, MD  calcium  carbonate (TUMS - DOSED IN MG ELEMENTAL CALCIUM ) 500 MG chewable tablet Chew 1 tablet by mouth as needed.      [provider]  Cholecalciferol (VITAMIN D3 PO) Take 1 capsule by mouth daily.    [provider]  cyanocobalamin  (V-R VITAMIN B-12) 500 MCG tablet Take 1 tablet (500 mcg total) by mouth daily. 01/02/17   Rilla Baller, MD  esomeprazole  (NEXIUM ) 20 MG capsule Take 1 capsule (20 mg total) by mouth daily at 12 noon. 12/21/19   Rilla Baller, MD  fluticasone  (FLONASE ) 50 MCG/ACT nasal spray USE 2 SPRAYS NASALY DAILY 12/14/14   Rilla Baller, MD  magnesium  30 MG tablet Take 30 mg by mouth daily.    [provider]  multivitamin [facility]) per tablet Take 1 tablet by mouth daily.      [provider]  oseltamivir  (TAMIFLU ) 75 MG capsule Take 1 capsule (75 mg total) by mouth every 12 (twelve) hours. 10/02/23   Corlis Burnard DEL, NP  psyllium (METAMUCIL) 58.6 % powder Take 1 packet by mouth daily.     [provider]  sildenafil  (VIAGRA ) 100 MG tablet Take 0.5-1 tablets (50-100 mg total) by mouth as needed for erectile dysfunction. 12/09/22   Rilla Baller, MD     Family History Family History  Problem Relation Age of Onset   Hypertension Mother    Depression Father    Prostate cancer Father 71       slow growing   CAD Neg Hx    Stroke Neg Hx    Diabetes Neg Hx    Colon cancer Neg Hx    Rectal cancer Neg Hx    Stomach cancer Neg Hx     Social History Social History   Tobacco Use   Smoking status: Former    Current packs/day: 0.00    Average packs/day: 0.5 packs/day for 7.0 years (3.5 ttl pk-yrs)    Types: Cigarettes    Start date: 10    Quit date: 1983    Years since quitting: 42.1   Smokeless tobacco: Never  Substance Use Topics   Alcohol use: Yes    Comment: 1 beer/day   Drug use: No     Allergies   Amoxicillin-pot clavulanate   Review of Systems Review of Systems  HENT:  Positive for ear pain and sore throat.   Respiratory:  Positive for cough. Negative for shortness of breath.      Physical Exam Triage Vital Signs ED Triage Vitals  Encounter Vitals Group     BP 10/04/23 0905 139/86     Systolic BP Percentile --      Diastolic BP Percentile --      Pulse Rate 10/04/23 0905 80     Resp 10/04/23 0905 18     Temp 10/04/23 0905 98.2 F (36.8 C)     Temp src --      SpO2 10/04/23 0905 98 %     Weight --      Height --      Head Circumference --      Peak Flow --      Pain Score 10/04/23 0908 5     Pain Loc --      Pain Education --      Exclude from Growth Chart --    No data found.  Updated Vital Signs BP 139/86   Pulse 80   Temp 98.2 F (36.8 C)   Resp 18   SpO2 98%   Visual Acuity Right Eye Distance:   Left Eye Distance:   Bilateral Distance:    Right Eye Near:   Left Eye Near:    Bilateral Near:     Physical Exam Constitutional:      General: He is not in acute distress. HENT:     Right Ear: Tympanic membrane normal.     Left Ear: Tympanic membrane normal.     Nose: Rhinorrhea present.     Mouth/Throat:     Mouth: Mucous membranes are moist.     Pharynx: Oropharynx is  clear.  Cardiovascular:     Rate and Rhythm: Normal rate and regular  rhythm.     Heart sounds: Normal heart sounds.  Pulmonary:     Effort: Pulmonary effort is normal. No respiratory distress.     Breath sounds: Normal breath sounds.  Neurological:     Mental Status: He is alert.      UC Treatments / Results  Labs (all labs ordered are listed, but only abnormal results are displayed) Labs Reviewed - No data to display  EKG   Radiology No results found.  Procedures Procedures (including critical care time)  Medications Ordered in UC Medications - No data to display  Initial Impression / Assessment and Plan / UC Course  I have reviewed the triage vital signs and the nursing notes.  Pertinent labs & imaging results that were available during my care of the patient were reviewed by me and considered in my medical decision making (see chart for details).    Bilateral otalgia, sore throat, influenza A.  Afebrile and vital signs are stable.  Lungs are clear and O2 sat is 98% on room air.  No indication of bacterial infection.  Discussed Tylenol  as needed for discomfort.  Instructed patient to continue taking Tamiflu  as directed.  Instructed him to follow-up with his PCP tomorrow.  Education provided on earache, sore throat, influenza.  Patient agrees to plan of care.  Final Clinical Impressions(s) / UC Diagnoses   Final diagnoses:  Otalgia of both ears  Sore throat  Influenza A     Discharge Instructions      Take Tylenol  as needed for discomfort.  Continue taking the Tamiflu  as directed.  Follow-up with your primary care provider tomorrow.     ED Prescriptions   None    PDMP not reviewed this encounter.   Corlis Burnard DEL, NP 10/04/23 [phone]

## 2023-10-04 NOTE — ED Triage Notes (Signed)
 Patient to Urgent Care with complaints of bilateral ear pain that started during the night. Poor sleep d/t pain. Describes pain being worse when swallowing.   Currently diagnosed w/ Flu A.

## 2023-10-04 NOTE — Discharge Instructions (Signed)
 Take Tylenol  as needed for discomfort.  Continue taking the Tamiflu  as directed.  Follow-up with your primary care provider tomorrow.

## 2023-10-28 DIAGNOSIS — K080 Exfoliation of teeth due to systemic causes: Secondary | ICD-10-CM | POA: Diagnosis not present

## 2023-11-10 ENCOUNTER — Ambulatory Visit: Payer: Medicare Other | Admitting: Family Medicine

## 2023-11-11 ENCOUNTER — Inpatient Hospital Stay: Payer: Medicare Other | Attending: Oncology

## 2023-11-11 ENCOUNTER — Encounter: Payer: Self-pay | Admitting: Oncology

## 2023-11-11 ENCOUNTER — Inpatient Hospital Stay (HOSPITAL_BASED_OUTPATIENT_CLINIC_OR_DEPARTMENT_OTHER): Payer: Medicare Other | Admitting: Oncology

## 2023-11-11 VITALS — BP 128/78 | HR 60 | Temp 97.2°F | Resp 18 | Wt 174.7 lb

## 2023-11-11 DIAGNOSIS — Z8042 Family history of malignant neoplasm of prostate: Secondary | ICD-10-CM | POA: Insufficient documentation

## 2023-11-11 DIAGNOSIS — C9110 Chronic lymphocytic leukemia of B-cell type not having achieved remission: Secondary | ICD-10-CM | POA: Diagnosis not present

## 2023-11-11 DIAGNOSIS — Z87891 Personal history of nicotine dependence: Secondary | ICD-10-CM | POA: Insufficient documentation

## 2023-11-11 DIAGNOSIS — R5382 Chronic fatigue, unspecified: Secondary | ICD-10-CM | POA: Diagnosis not present

## 2023-11-11 DIAGNOSIS — R161 Splenomegaly, not elsewhere classified: Secondary | ICD-10-CM | POA: Insufficient documentation

## 2023-11-11 DIAGNOSIS — M25519 Pain in unspecified shoulder: Secondary | ICD-10-CM | POA: Diagnosis not present

## 2023-11-11 LAB — CBC WITH DIFFERENTIAL (CANCER CENTER ONLY)
Abs Immature Granulocytes: 0.06 10*3/uL (ref 0.00–0.07)
Basophils Absolute: 0.1 10*3/uL (ref 0.0–0.1)
Basophils Relative: 0 %
Eosinophils Absolute: 0.1 10*3/uL (ref 0.0–0.5)
Eosinophils Relative: 0 %
HCT: 41 % (ref 39.0–52.0)
Hemoglobin: 13.8 g/dL (ref 13.0–17.0)
Immature Granulocytes: 0 %
Lymphocytes Relative: 49 %
Lymphs Abs: 6.4 10*3/uL — ABNORMAL HIGH (ref 0.7–4.0)
MCH: 30.7 pg (ref 26.0–34.0)
MCHC: 33.7 g/dL (ref 30.0–36.0)
MCV: 91.1 fL (ref 80.0–100.0)
Monocytes Absolute: 0.5 10*3/uL (ref 0.1–1.0)
Monocytes Relative: 4 %
Neutro Abs: 6.4 10*3/uL (ref 1.7–7.7)
Neutrophils Relative %: 47 %
Platelet Count: 299 10*3/uL (ref 150–400)
RBC: 4.5 MIL/uL (ref 4.22–5.81)
RDW: 13 % (ref 11.5–15.5)
Smear Review: NORMAL
WBC Count: 13.5 10*3/uL — ABNORMAL HIGH (ref 4.0–10.5)
nRBC: 0 % (ref 0.0–0.2)

## 2023-11-11 LAB — CMP (CANCER CENTER ONLY)
ALT: 26 U/L (ref 0–44)
AST: 24 U/L (ref 15–41)
Albumin: 4.6 g/dL (ref 3.5–5.0)
Alkaline Phosphatase: 69 U/L (ref 38–126)
Anion gap: 5 (ref 5–15)
BUN: 23 mg/dL (ref 8–23)
CO2: 27 mmol/L (ref 22–32)
Calcium: 9.3 mg/dL (ref 8.9–10.3)
Chloride: 106 mmol/L (ref 98–111)
Creatinine: 1.14 mg/dL (ref 0.61–1.24)
GFR, Estimated: >60 mL/min (ref >60)
Glucose, Bld: 103 mg/dL — ABNORMAL HIGH (ref 70–99)
Potassium: 4.6 mmol/L (ref 3.5–5.1)
Sodium: 138 mmol/L (ref 135–145)
Total Bilirubin: 0.8 mg/dL (ref 0.0–1.2)
Total Protein: 6.9 g/dL (ref 6.5–8.1)

## 2023-11-11 LAB — LACTATE DEHYDROGENASE: LDH: 122 U/L (ref 98–192)

## 2023-11-11 NOTE — Progress Notes (Signed)
 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 Labs are reviewed and discussed with patient. Wbc/lymphocyte counts gradually increase Physical examination did not appreciate significant lymphadenopathy. No significant constitutional symptoms.  Continue watchful waiting.    Splenomegaly Mild splenomegaly.  Monitor.   Orders Placed This Encounter  Procedures   CBC with Differential (Cancer Center Only)    Standing Status:   Future    Expected Date:   05/13/2024    Expiration Date:   11/10/2024   CMP (Cancer Center only)    Standing Status:   Future    Expected Date:   05/13/2024    Expiration Date:   11/10/2024   Lactate dehydrogenase    Standing Status:   Future    Expected Date:   05/13/2024    Expiration Date:   11/10/2024   Follow up in 6 months.  All questions were answered. The patient knows to call the clinic with any problems, questions or concerns.  Rickard Patience, MD, PhD [facility] Hematology Oncology 11/11/2023   [name], MD  CHIEF COMPLAINTS/REASON FOR VISIT:  Follow-up for CLL  HISTORY OF PRESENTING ILLNESS:  68 y.o.male presents for CLL.  Oncology History  CLL (chronic lymphocytic leukemia) (HCC)  01/08/2022 Initial Diagnosis   CLL (chronic lymphocytic leukemia) (HCC)   01/08/2022 Cancer Staging   Staging form: Chronic Lymphocytic Leukemia / Small Lymphocytic Lymphoma, AJCC 8th Edition - Clinical: Modified Rai Stage II (Modified Rai risk: Intermediate, Lymphocytosis: Present, Adenopathy: Absent, Organomegaly: Present, Anemia: Absent, Thrombocytopenia: Absent) - Signed by Rickard Patience, MD on 05/12/2022 Stage prefix: Initial diagnosis    01/27/2022 Imaging   US abdomen complete  showed Borderline size of the spleen.      Former smoker, he stopped in the 1980s.  He drinks 1 beer per day.  Recently has tried to cut back on alcohol consumption. Patient has some chronic fatigue which is unchanged. + Arthralgia-shoulder pain   INTERVAL HISTORY Benjamin Robles is a 68 y.o. male who has above history reviewed by me today presents for follow up visit to review results. Patient has no new complaints.  Accompanied by wife. Increased fatigue.  Denies fever, night sweats.  His weight has been stable compared to 6 months ago.      Review of Systems  Constitutional:  Positive for fatigue. Negative for appetite change, chills, fever and unexpected weight change.  HENT:   Negative for hearing loss and voice change.   Eyes:  Negative for eye problems and icterus.  Respiratory:  Negative for chest tightness, cough and shortness of breath.   Cardiovascular:  Negative for chest pain and leg swelling.  Gastrointestinal:  Negative for abdominal distention and abdominal pain.  Endocrine: Negative for hot flashes.  Genitourinary:  Negative for difficulty urinating, dysuria and frequency.   Musculoskeletal:  Positive for arthralgias.  Skin:  Negative for itching and rash.  Neurological:  Negative for light-headedness and numbness.  Hematological:  Negative for adenopathy. Does not bruise/bleed easily.  Psychiatric/Behavioral:  Negative for confusion.     MEDICAL HISTORY:  Past Medical History:  Diagnosis Date   Allergy    SINUS ALLEGIES   Anticoagulant disorder (HCC)    Anxiety    BPH (benign prostatic hypertrophy)  mild   Bradycardia    Cancer (HCC)    CLL (chronic lymphocytic leukemia) (HCC)    Depression    GERD (gastroesophageal reflux disease)    History of colon polyps    normal colonoscopy 2012   Pain of right sternoclavicular joint    MRI 06/2012 showing ?erosive arthritis   Umbilical hernia     SURGICAL HISTORY: Past Surgical History:  Procedure  Laterality Date   COLONOSCOPY  08/07/2011   WNL, rpt 10 yrs   COLONOSCOPY  12/2021   HP x1, rpt 10 yrs Marina Goodell)   ESOPHAGOGASTRODUODENOSCOPY  12/2021   WNL Marina Goodell)   HERNIA REPAIR     x2   UPPER GASTROINTESTINAL ENDOSCOPY  08/07/2011   chronic superficial gastritis, oozing nodule clipped, neg H pylori    SOCIAL HISTORY: Social History   Socioeconomic History   Marital status: Married    Spouse name: Not on file   Number of children: 2   Years of education: Not on file   Highest education level: Not on file  Occupational History   Occupation: Charity fundraiser: [employer]  Tobacco Use   Smoking status: Former    Current packs/day: 0.00    Average packs/day: 0.5 packs/day for 7.0 years (3.5 ttl pk-yrs)    Types: Cigarettes    Start date: 72    Quit date: 22    Years since quitting: 42.2   Smokeless tobacco: Never  Substance and Sexual Activity   Alcohol use: Yes    Comment: 1 beer/day   Drug use: No   Sexual activity: Yes  Other Topics Concern   Not on file  Social History Narrative   "Will"   Lives with wife, 2 cats and 2 dogs, grown children   Occupation: Orthoptist support   Activity: no regular exercise routine   Diet: good water, fruits/vegetables daily   Social Drivers of Corporate investment banker Strain: Not on file  Food Insecurity: Not on file  Transportation Needs: Not on file  Physical Activity: Not on file  Stress: Not on file  Social Connections: Not on file  Intimate Partner Violence: Not on file    FAMILY HISTORY: Family History  Problem Relation Age of Onset   Hypertension Mother    Depression Father    Prostate cancer Father 60       slow growing   CAD Neg Hx    Stroke Neg Hx    Diabetes Neg Hx    Colon cancer Neg Hx    Rectal cancer Neg Hx    Stomach cancer Neg Hx     ALLERGIES:  is allergic to amoxicillin-pot clavulanate.  MEDICATIONS:  Current Outpatient Medications  Medication Sig Dispense Refill    atorvastatin (LIPITOR) 20 MG tablet Take 1 tablet (20 mg total) by mouth daily. 90 tablet 3   calcium carbonate (TUMS - DOSED IN MG ELEMENTAL CALCIUM) 500 MG chewable tablet Chew 1 tablet by mouth as needed.       Cholecalciferol (VITAMIN D3 PO) Take 1 capsule by mouth daily.     cyanocobalamin (V-R VITAMIN B-12) 500 MCG tablet Take 1 tablet (500 mcg total) by mouth daily.     esomeprazole (NEXIUM) 20 MG capsule Take 1 capsule (20 mg total) by mouth daily at 12 noon.     fluticasone (FLONASE) 50 MCG/ACT nasal spray USE 2 SPRAYS NASALY DAILY 48 g 3   magnesium 30 MG tablet Take 30 mg by mouth daily.  multivitamin (THERAGRAN) per tablet Take 1 tablet by mouth daily.       psyllium (METAMUCIL) 58.6 % powder Take 1 packet by mouth daily.      sildenafil (VIAGRA) 100 MG tablet Take 0.5-1 tablets (50-100 mg total) by mouth as needed for erectile dysfunction. 10 tablet 6   No current facility-administered medications for this visit.     PHYSICAL EXAMINATION: ECOG PERFORMANCE STATUS: 0 - Asymptomatic Vitals:   11/11/23 1309  BP: 128/78  Pulse: 60  Resp: 18  Temp: (!) 97.2 F (36.2 C)   Filed Weights   11/11/23 1309  Weight: 174 lb 11.2 oz (79.2 kg)    Physical Exam Constitutional:      General: He is not in acute distress. HENT:     Head: Normocephalic and atraumatic.  Eyes:     General: No scleral icterus. Cardiovascular:     Rate and Rhythm: Normal rate.  Pulmonary:     Effort: Pulmonary effort is normal. No respiratory distress.     Breath sounds: No wheezing.  Abdominal:     General: Bowel sounds are normal. There is no distension.     Palpations: Abdomen is soft.     Comments: Palpable liver edge just below costal margin.  Musculoskeletal:        General: No deformity. Normal range of motion.     Cervical back: Normal range of motion.  Skin:    General: Skin is warm and dry.  Neurological:     Mental Status: He is alert and oriented to person, place, and time.  Mental status is at baseline.  Psychiatric:        Mood and Affect: Mood normal.     LABORATORY DATA:  I have reviewed the data as listed    Latest Ref Rng & Units 11/11/2023   12:42 PM 05/13/2023    1:11 PM 11/10/2022   12:50 PM  CBC  WBC 4.0 - 10.5 K/uL 13.5  16.9  12.5   Hemoglobin 13.0 - 17.0 g/dL 78.2  95.6  21.3   Hematocrit 39.0 - 52.0 % 41.0  41.3  41.8   Platelets 150 - 400 K/uL 299  283  292       Latest Ref Rng & Units 11/11/2023   12:43 PM 05/13/2023    1:11 PM 11/10/2022   12:50 PM  CMP  Glucose 70 - 99 mg/dL 086  578  469   BUN 8 - 23 mg/dL 23  21  15    Creatinine 0.61 - 1.24 mg/dL 6.29  5.28  4.13   Sodium 135 - 145 mmol/L 138  134  137   Potassium 3.5 - 5.1 mmol/L 4.6  4.8  4.2   Chloride 98 - 111 mmol/L 106  102  104   CO2 22 - 32 mmol/L 27  24  26    Calcium 8.9 - 10.3 mg/dL 9.3  9.1  9.0   Total Protein 6.5 - 8.1 g/dL 6.9  6.7  7.2   Total Bilirubin 0.0 - 1.2 mg/dL 0.8  1.0  0.7   Alkaline Phos 38 - 126 U/L 69  70  65   AST 15 - 41 U/L 24  23  24    ALT 0 - 44 U/L 26  25  20    ;  RADIOGRAPHIC STUDIES: I have personally reviewed the radiological images as listed and agreed with the findings in the report. No results found.

## 2023-11-11 NOTE — Assessment & Plan Note (Signed)
Mild splenomegaly.  Monitor.

## 2023-11-11 NOTE — Assessment & Plan Note (Signed)
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 Labs are reviewed and discussed with patient. Wbc/lymphocyte counts gradually increase Physical examination did not appreciate significant lymphadenopathy. No significant constitutional symptoms.  Continue watchful waiting.

## 2023-11-20 NOTE — Progress Notes (Unsigned)
 [name] [address] [place] [address] Phone: [phone] Subjective:   Bruce Donath, am serving as a scribe for Dr. Antoine Primas.  I'm seeing this patient by the request  of:  [name], MD  CC: Low back pain follow-up  [id number]  Benjamin Robles is a 68 y.o. male coming in with complaint of back and neck pain. OMT on 08/10/2023. Patient states that he has been sore due to yardwork. States that L SI joint might be out of place.   B shoulders have been sore as well from working outside. Continues to do HEP 3x a week.   Neck needs adjustment as he is uncomfortable when sleeping.   Also notes a pinch of medial aspect of R knee. Had a slight bruise that was over medial joint line into the VMO. Pain completely resolved now.   Medications patient has been prescribed:   Taking:         Reviewed prior external information including notes and imaging from previsou exam, outside providers and external EMR if available.   As well as notes that were available from care everywhere and other healthcare systems.  Past medical history, social, surgical and family history all reviewed in electronic medical record.  No pertanent information unless stated regarding to the chief complaint.   Past Medical History:  Diagnosis Date   Allergy    SINUS ALLEGIES   Anticoagulant disorder (HCC)    Anxiety    BPH (benign prostatic hypertrophy)    mild   Bradycardia    Cancer (HCC)    CLL (chronic lymphocytic leukemia) (HCC)    Depression    GERD (gastroesophageal reflux disease)    History of colon polyps    normal colonoscopy 2012   Pain of right sternoclavicular joint    MRI 06/2012 showing ?erosive arthritis   Umbilical hernia     Allergies  Allergen Reactions   Amoxicillin-Pot Clavulanate     REACTION: achey, itch     Review of Systems:  No headache, visual changes, nausea, vomiting, diarrhea, constipation, dizziness,  abdominal pain, skin rash, fevers, chills, night sweats, weight loss, swollen lymph nodes, body aches, joint swelling, chest pain, shortness of breath, mood changes. POSITIVE muscle aches  Objective  Blood pressure 120/74, pulse 62, height 6' (1.829 m), weight 177 lb (80.3 kg), SpO2 98%.   General: No apparent distress alert and oriented x3 mood and affect normal, dressed appropriately.  HEENT: Pupils equal, extraocular movements intact  Respiratory: Patient's speak in full sentences and does not appear short of breath  Cardiovascular: No lower extremity edema, non tender, no erythema  Gait MSK:  Back   Osteopathic findings  C2 flexed rotated and side bent right C6 flexed rotated and side bent left T3 extended rotated and side bent right inhaled rib T9 extended rotated and side bent left L2 flexed rotated and side bent right Sacrum right on right       Assessment and Plan:  No problem-specific Assessment & Plan notes found for this encounter.    Nonallopathic problems  Decision today to treat with OMT was based on Physical Exam  After verbal consent patient was treated with HVLA, ME, FPR techniques in cervical, rib, thoracic, lumbar, and sacral  areas  Patient tolerated the procedure well with improvement in symptoms  Patient given exercises, stretches and lifestyle modifications  See medications in patient instructions if given  Patient will follow up in 4-8 weeks  Note: This dictation was prepared with Dragon dictation along with smaller phrase technology. Any transcriptional errors that result from this process are unintentional.

## 2023-11-25 ENCOUNTER — Ambulatory Visit: Payer: Medicare Other | Admitting: Family Medicine

## 2023-11-25 VITALS — BP 120/74 | HR 62 | Ht 72.0 in | Wt 177.0 lb

## 2023-11-25 DIAGNOSIS — M9903 Segmental and somatic dysfunction of lumbar region: Secondary | ICD-10-CM | POA: Diagnosis not present

## 2023-11-25 DIAGNOSIS — M9902 Segmental and somatic dysfunction of thoracic region: Secondary | ICD-10-CM

## 2023-11-25 DIAGNOSIS — M9908 Segmental and somatic dysfunction of rib cage: Secondary | ICD-10-CM | POA: Diagnosis not present

## 2023-11-25 DIAGNOSIS — M533 Sacrococcygeal disorders, not elsewhere classified: Secondary | ICD-10-CM | POA: Diagnosis not present

## 2023-11-25 DIAGNOSIS — M9904 Segmental and somatic dysfunction of sacral region: Secondary | ICD-10-CM | POA: Diagnosis not present

## 2023-11-25 DIAGNOSIS — M9901 Segmental and somatic dysfunction of cervical region: Secondary | ICD-10-CM | POA: Diagnosis not present

## 2023-11-25 NOTE — Patient Instructions (Signed)
 Plica syndrome Ice [place]  See me in 7-8 weeks

## 2023-11-26 ENCOUNTER — Encounter: Payer: Self-pay | Admitting: Family Medicine

## 2023-11-26 NOTE — Assessment & Plan Note (Signed)
 Sacroiliac dysfunction noted.  Discussed icing regimen of home exercises, which activities to do and which ones to avoid.  Discussed increasing activity slowly.  Discussed with FABER test.  Follow-up again in 6 to 8 weeks

## 2023-12-03 ENCOUNTER — Other Ambulatory Visit: Payer: Self-pay | Admitting: Family Medicine

## 2023-12-03 DIAGNOSIS — E7800 Pure hypercholesterolemia, unspecified: Secondary | ICD-10-CM

## 2023-12-03 NOTE — Telephone Encounter (Signed)
 E-scribed refill.   Plz schedule CPE and fasting labs (no food/drink- except water and/or blk coffee 5 hrs prior) to prevent delays in future refills.

## 2023-12-03 NOTE — Telephone Encounter (Signed)
 Noted.

## 2023-12-03 NOTE — Telephone Encounter (Signed)
 Patient has been scheduled

## 2023-12-04 ENCOUNTER — Ambulatory Visit: Payer: Medicare Other

## 2023-12-04 VITALS — BP 130/78 | Ht 72.0 in | Wt 176.2 lb

## 2023-12-04 DIAGNOSIS — Z0000 Encounter for general adult medical examination without abnormal findings: Secondary | ICD-10-CM

## 2023-12-04 NOTE — Progress Notes (Signed)
 Subjective:   Benjamin Robles is a 68 y.o. who presents for a Medicare Wellness preventive visit.  Visit Complete: In person  Persons Participating in Visit: Patient.  AWV Questionnaire: No: Patient Medicare AWV questionnaire was not completed prior to this visit.  Cardiac Risk Factors include: advanced age (>61men, >25 women);dyslipidemia;male gender     Objective:    Today's Vitals   12/04/23 1424  BP: 130/78  Weight: 176 lb 3.2 oz (79.9 kg)  Height: 6' (1.829 m)   Body mass index is 23.9 kg/m.     12/04/2023    2:44 PM 11/11/2023    1:03 PM 10/04/2023    9:05 AM 10/02/2023    9:54 AM 11/10/2022    1:10 PM 09/01/2022    9:19 AM 05/12/2022    1:20 PM  Advanced Directives  Does Patient Have a Medical Advance Directive? Yes No No No Yes Yes Yes  Type of Estate agent of [place];Living will    Healthcare Power of [place];Living will Healthcare Power of [place];Living will Living will;Healthcare Power of Attorney  Copy of Healthcare Power of Attorney in Chart? No - copy requested    No - copy requested      Current Medications (verified) Outpatient Encounter Medications as of 12/04/2023  Medication Sig   atorvastatin (LIPITOR) 20 MG tablet TAKE 1 TABLET BY MOUTH DAILY   calcium carbonate (TUMS - DOSED IN MG ELEMENTAL CALCIUM) 500 MG chewable tablet Chew 1 tablet by mouth as needed.     Cholecalciferol (VITAMIN D3 PO) Take 1 capsule by mouth daily.   cyanocobalamin (V-R VITAMIN B-12) 500 MCG tablet Take 1 tablet (500 mcg total) by mouth daily.   esomeprazole (NEXIUM) 20 MG capsule Take 1 capsule (20 mg total) by mouth daily at 12 noon.   fluticasone (FLONASE) 50 MCG/ACT nasal spray USE 2 SPRAYS NASALY DAILY   magnesium 30 MG tablet Take 30 mg by mouth daily.   multivitamin (THERAGRAN) per tablet Take 1 tablet by mouth daily.     psyllium (METAMUCIL) 58.6 % powder Take 1 packet by mouth daily.    sildenafil (VIAGRA) 100 MG tablet Take 0.5-1 tablets (50-100 mg  total) by mouth as needed for erectile dysfunction.   No facility-administered encounter medications on file as of 12/04/2023.    Allergies (verified) Amoxicillin-pot clavulanate   History: Past Medical History:  Diagnosis Date   Allergy    SINUS ALLEGIES   Anticoagulant disorder (HCC)    Anxiety    BPH (benign prostatic hypertrophy)    mild   Bradycardia    Cancer (HCC)    CLL (chronic lymphocytic leukemia) (HCC)    Depression    GERD (gastroesophageal reflux disease)    History of colon polyps    normal colonoscopy 2012   Pain of right sternoclavicular joint    MRI 06/2012 showing ?erosive arthritis   Umbilical hernia    Past Surgical History:  Procedure Laterality Date   COLONOSCOPY  08/07/2011   WNL, rpt 10 yrs   COLONOSCOPY  12/2021   HP x1, rpt 10 yrs Marina Goodell)   ESOPHAGOGASTRODUODENOSCOPY  12/2021   WNL Marina Goodell)   HERNIA REPAIR     x2   UPPER GASTROINTESTINAL ENDOSCOPY  08/07/2011   chronic superficial gastritis, oozing nodule clipped, neg H pylori   Family History  Problem Relation Age of Onset   Hypertension Mother    Depression Father    Prostate cancer Father 81       slow  growing   CAD Neg Hx    Stroke Neg Hx    Diabetes Neg Hx    Colon cancer Neg Hx    Rectal cancer Neg Hx    Stomach cancer Neg Hx    Social History   Socioeconomic History   Marital status: Married    Spouse name: Not on file   Number of children: 2   Years of education: Not on file   Highest education level: Not on file  Occupational History   Occupation: Charity fundraiser: [employer]  Tobacco Use   Smoking status: Former    Current packs/day: 0.00    Average packs/day: 0.5 packs/day for 7.0 years (3.5 ttl pk-yrs)    Types: Cigarettes    Start date: 65    Quit date: 26    Years since quitting: 42.3   Smokeless tobacco: Never  Substance and Sexual Activity   Alcohol use: Yes    Comment: 1 beer/day   Drug use: No   Sexual activity: Yes  Other Topics  Concern   Not on file  Social History Narrative   "Will"   Lives with wife, 2 cats and 2 dogs, grown children   Occupation: Orthoptist support   Activity: no regular exercise routine   Diet: good water, fruits/vegetables daily   Social Drivers of Health   Financial Resource Strain: Low Risk  (12/04/2023)   Overall Financial Resource Strain (CARDIA)    Difficulty of Paying Living Expenses: Not hard at all  Food Insecurity: No Food Insecurity (12/04/2023)   Hunger Vital Sign    Worried About Running Out of Food in the Last Year: Never true    Ran Out of Food in the Last Year: Never true  Transportation Needs: No Transportation Needs (12/04/2023)   PRAPARE - Administrator, Civil Service (Medical): No    Lack of Transportation (Non-Medical): No  Physical Activity: Sufficiently Active (12/04/2023)   Exercise Vital Sign    Days of Exercise per Week: 3 days    Minutes of Exercise per Session: 90 min  Stress: No Stress Concern Present (12/04/2023)   [employer] of Occupational Health - Occupational Stress Questionnaire    Feeling of Stress : Only a little  Social Connections: Socially Isolated (12/04/2023)   Social Connection and Isolation Panel [NHANES]    Frequency of Communication with Friends and Family: Once a week    Frequency of Social Gatherings with Friends and Family: Never    Attends Religious Services: Never    Diplomatic Services operational officer: No    Attends Engineer, structural: Never    Marital Status: Married    Tobacco Counseling Counseling given: Not Answered   Clinical Intake:  Pre-visit preparation completed: Yes  Pain : No/denies pain    BMI - recorded: 23.9 Nutritional Status: BMI of 19-24  Normal Nutritional Risks: None Diabetes: No  No results found for: "HGBA1C"   How often do you need to have someone help you when you read instructions, pamphlets, or other written materials from your doctor or pharmacy?: 1 -  Never  Interpreter Needed?: No  Comments: lives  with wife Information entered by :: B.Shylie Polo,LPN   Activities of Daily Living     12/04/2023    2:45 PM  In your present state of health, do you have any difficulty performing the following activities:  Hearing? 1  Vision? 0  Difficulty concentrating or making decisions? 0  Walking  or climbing stairs? 0  Dressing or bathing? 0  Doing errands, shopping? 0  Preparing Food and eating ? N  Using the Toilet? N  In the past six months, have you accidently leaked urine? N  Do you have problems with loss of bowel control? N  Managing your Medications? N  Managing your Finances? N  Housekeeping or managing your Housekeeping? N    Patient Care Team: [name], MD as PCP - Buren Kos, MD as Consulting Physician (Hematology and Oncology) [facility], [facility]  Indicate any recent Medical Services you may have received from other than [facility] providers in the past year (date may be approximate).     Assessment:   This is a routine wellness examination for Benjamin Robles.  Hearing/Vision screen Hearing Screening - Comments:: Pt says hearing is good with hearing aids Vision Screening - Comments:: Pt says his vision is good; just a little distance blur [facility]-needs a visit    Goals Addressed             This Visit's Progress    Patient Stated       12/04/23- I would like to maintain my health and routine       Depression Screen     12/04/2023    2:39 PM 12/09/2022    9:58 AM 12/04/2021   12:51 PM 12/21/2019    4:13 PM  PHQ 2/9 Scores  PHQ - 2 Score 0 0 1 1  PHQ- 9 Score  3 6 6     Fall Risk     12/04/2023    2:28 PM 12/09/2022    9:58 AM  Fall Risk   Falls in the past year? 0 1  Number falls in past yr: 0 0  Injury with Fall? 0 1  Risk for fall due to : No Fall Risks   Follow up Education provided;Falls prevention discussed     MEDICARE RISK AT HOME:  Medicare Risk at Home Any stairs in or around  the home?: Yes If so, are there any without handrails?: Yes Home free of loose throw rugs in walkways, pet beds, electrical cords, etc?: Yes Adequate lighting in your home to reduce risk of falls?: Yes Life alert?: No Use of a cane, walker or w/c?: No Grab bars in the bathroom?: No Shower chair or bench in shower?: No Elevated toilet seat or a handicapped toilet?: No  TIMED UP AND GO:  Was the test performed?  Yes  Length of time to ambulate 10 feet: 10 sec Gait steady and fast without use of assistive device  Cognitive Function: 6CIT completed        12/04/2023    2:46 PM  6CIT Screen  What Year? 0 points  What month? 0 points  What time? 0 points  Count back from 20 0 points  Months in reverse 0 points  Repeat phrase 0 points  Total Score 0 points    Immunizations Immunization History  Administered Date(s) Administered   Fluad Quad(high Dose 65+) 07/02/2022   Influenza Inj Mdck Quad Pf 06/15/2018   Influenza Whole 06/25/2002, 05/25/2013   Influenza, High Dose Seasonal PF 04/29/2021   Influenza,inj,Quad PF,6+ Mos 06/21/2020   Influenza-Unspecified 05/25/2014, 05/30/2015   Moderna Sars-Covid-2 Vaccination 11/09/2019, 12/07/2019, 07/10/2020, 03/06/2021   PNEUMOCOCCAL CONJUGATE-20 12/04/2021   Pfizer Covid-19 Vaccine Bivalent Booster 71yrs & up 08/14/2021   Td 01/10/2002, 08/25/2008   Tdap 12/21/2019   Zoster Recombinant(Shingrix) 12/21/2019, 06/21/2020    Screening Tests Health  Maintenance  Topic Date Due   COVID-19 Vaccine (6 - 2024-25 season) 04/26/2023   INFLUENZA VACCINE  03/25/2024   Medicare Annual Wellness (AWV)  12/03/2024   DTaP/Tdap/Td (4 - Td or Tdap) 12/20/2029   Colonoscopy  01/08/2032   Pneumonia Vaccine 56+ Years old  Completed   Hepatitis C Screening  Completed   Zoster Vaccines- Shingrix  Completed   HPV VACCINES  Aged Out   Meningococcal B Vaccine  Aged Out    Health Maintenance  Health Maintenance Due  Topic Date Due   COVID-19  Vaccine (6 - 2024-25 season) 04/26/2023   Health Maintenance Items Addressed:None needed  Additional Screening:  Vision Screening: Recommended annual ophthalmology exams for early detection of glaucoma and other disorders of the eye.  Dental Screening: Recommended annual dental exams for proper oral hygiene  Community Resource Referral / Chronic Care Management: CRR required this visit?  No   CCM required this visit?  No   Plan:     I have personally reviewed and noted the following in the patient's chart:   Medical and social history Use of alcohol, tobacco or illicit drugs  Current medications and supplements including opioid prescriptions. Patient is not currently taking opioid prescriptions. Functional ability and status Nutritional status Physical activity Advanced directives List of other physicians Hospitalizations, surgeries, and ER visits in previous 12 months Vitals Screenings to include cognitive, depression, and falls Referrals and appointments  In addition, I have reviewed and discussed with patient certain preventive protocols, quality metrics, and best practice recommendations. A written personalized care plan for preventive services as well as general preventive health recommendations were provided to patient.    Sue Lush, LPN   8/41/3244   After Visit Summary: (MyChart) Due to this being a telephonic visit, the after visit summary with patients personalized plan was offered to patient via MyChart   Notes: Pt has concerns about his prostate (whether worsening?) and about long term use of his atorvastatin and nexium. No other concerns or questions.

## 2023-12-04 NOTE — Patient Instructions (Signed)
 Mr. Benjamin Robles , Thank you for taking time to come for your Medicare Wellness Visit. I appreciate your ongoing commitment to your health goals. Please review the following plan we discussed and let me know if I can assist you in the future.   Referrals/Orders/Follow-Ups/Clinician Recommendations: none  This is a list of the screening recommended for you and due dates:  Health Maintenance  Topic Date Due   COVID-19 Vaccine (6 - 2024-25 season) 04/26/2023   Flu Shot  03/25/2024   Medicare Annual Wellness Visit  12/03/2024   DTaP/Tdap/Td vaccine (4 - Td or Tdap) 12/20/2029   Colon Cancer Screening  01/08/2032   Pneumonia Vaccine  Completed   Hepatitis C Screening  Completed   Zoster (Shingles) Vaccine  Completed   HPV Vaccine  Aged Out   Meningitis B Vaccine  Aged Out    Advanced directives: (Copy Requested) Please bring a copy of your health care power of attorney and living will to the office to be added to your chart at your convenience. You can mail to [facility] [address] [place], [place] [address] or email to [email or web address]  Next Medicare Annual Wellness Visit scheduled for next year: Yes 12/06/24 @ 1:40pm in person

## 2023-12-12 ENCOUNTER — Other Ambulatory Visit: Payer: Self-pay | Admitting: Family Medicine

## 2023-12-12 DIAGNOSIS — N401 Enlarged prostate with lower urinary tract symptoms: Secondary | ICD-10-CM

## 2023-12-12 DIAGNOSIS — E538 Deficiency of other specified B group vitamins: Secondary | ICD-10-CM

## 2023-12-12 DIAGNOSIS — E7800 Pure hypercholesterolemia, unspecified: Secondary | ICD-10-CM

## 2023-12-14 ENCOUNTER — Other Ambulatory Visit (INDEPENDENT_AMBULATORY_CARE_PROVIDER_SITE_OTHER)

## 2023-12-14 ENCOUNTER — Other Ambulatory Visit

## 2023-12-14 DIAGNOSIS — E7800 Pure hypercholesterolemia, unspecified: Secondary | ICD-10-CM

## 2023-12-14 DIAGNOSIS — E538 Deficiency of other specified B group vitamins: Secondary | ICD-10-CM

## 2023-12-14 DIAGNOSIS — N401 Enlarged prostate with lower urinary tract symptoms: Secondary | ICD-10-CM

## 2023-12-14 DIAGNOSIS — R350 Frequency of micturition: Secondary | ICD-10-CM

## 2023-12-14 LAB — LIPID PANEL
Cholesterol: 141 mg/dL (ref 0–200)
HDL: 51.4 mg/dL (ref >39.00)
LDL Cholesterol: 72 mg/dL (ref 0–99)
NonHDL: 89.56
Total CHOL/HDL Ratio: 3
Triglycerides: 90 mg/dL (ref 0.0–149.0)
VLDL: 18 mg/dL (ref 0.0–40.0)

## 2023-12-14 LAB — VITAMIN B12: Vitamin B-12: 1231 pg/mL — ABNORMAL HIGH (ref 211–911)

## 2023-12-14 LAB — PSA: PSA: 1.59 ng/mL (ref 0.10–4.00)

## 2023-12-15 ENCOUNTER — Other Ambulatory Visit: Payer: Self-pay | Admitting: Family Medicine

## 2023-12-21 ENCOUNTER — Encounter: Payer: Self-pay | Admitting: Family Medicine

## 2023-12-21 ENCOUNTER — Ambulatory Visit (INDEPENDENT_AMBULATORY_CARE_PROVIDER_SITE_OTHER): Admitting: Family Medicine

## 2023-12-21 VITALS — BP 126/68 | HR 71 | Temp 98.4°F | Ht 72.5 in | Wt 174.1 lb

## 2023-12-21 DIAGNOSIS — Z8249 Family history of ischemic heart disease and other diseases of the circulatory system: Secondary | ICD-10-CM

## 2023-12-21 DIAGNOSIS — R350 Frequency of micturition: Secondary | ICD-10-CM | POA: Diagnosis not present

## 2023-12-21 DIAGNOSIS — Z0000 Encounter for general adult medical examination without abnormal findings: Secondary | ICD-10-CM

## 2023-12-21 DIAGNOSIS — N401 Enlarged prostate with lower urinary tract symptoms: Secondary | ICD-10-CM

## 2023-12-21 DIAGNOSIS — E7800 Pure hypercholesterolemia, unspecified: Secondary | ICD-10-CM | POA: Diagnosis not present

## 2023-12-21 DIAGNOSIS — K219 Gastro-esophageal reflux disease without esophagitis: Secondary | ICD-10-CM

## 2023-12-21 DIAGNOSIS — B001 Herpesviral vesicular dermatitis: Secondary | ICD-10-CM | POA: Insufficient documentation

## 2023-12-21 DIAGNOSIS — N529 Male erectile dysfunction, unspecified: Secondary | ICD-10-CM | POA: Diagnosis not present

## 2023-12-21 DIAGNOSIS — C9110 Chronic lymphocytic leukemia of B-cell type not having achieved remission: Secondary | ICD-10-CM

## 2023-12-21 DIAGNOSIS — T753XXA Motion sickness, initial encounter: Secondary | ICD-10-CM | POA: Insufficient documentation

## 2023-12-21 DIAGNOSIS — Z7189 Other specified counseling: Secondary | ICD-10-CM

## 2023-12-21 DIAGNOSIS — E538 Deficiency of other specified B group vitamins: Secondary | ICD-10-CM

## 2023-12-21 MED ORDER — VALACYCLOVIR HCL 1 G PO TABS: 2000.0000 mg | ORAL_TABLET | Freq: Two times a day (BID) | ORAL | 0 refills | Status: AC

## 2023-12-21 MED ORDER — SILDENAFIL CITRATE 100 MG PO TABS
50.0000 mg | ORAL_TABLET | ORAL | 11 refills | Status: AC | PRN
Start: 2023-12-21 — End: ?

## 2023-12-21 MED ORDER — SCOPOLAMINE 1 MG/3DAYS TD PT72: 1.0000 | MEDICATED_PATCH | TRANSDERMAL | 0 refills | Status: DC

## 2023-12-21 MED ORDER — TAMSULOSIN HCL 0.4 MG PO CAPS: 0.4000 mg | ORAL_CAPSULE | Freq: Every day | ORAL | 6 refills | Status: DC

## 2023-12-21 NOTE — Progress Notes (Signed)
 Ph: [phone] Fax: [phone]   Patient ID: Benjamin Robles, male    DOB: 06/26/1956, 68 y.o.   MRN: [id number]  This visit was conducted in person.  BP 126/68   Pulse 71   Temp 98.4 F (36.9 C) (Oral)   Ht 6' 0.5" (1.842 m)   Wt 174 lb 2 oz (79 kg)   SpO2 98%   BMI 23.29 kg/m    CC: CPE Subjective:   HPI: Benjamin Robles is a 68 y.o. male presenting on 12/21/2023 for Annual Exam (MCR prt 2 [AWV- 12/04/23]. )   Upcoming trip to [place] for 2+ wks.  Saw health advisor 12/04/2023 for medicare wellness visit. Note reviewed.    No results found.  Flowsheet Row Office Visit from 12/21/2023 in [facility] [place]  PHQ-2 Total Score 0          12/21/2023    2:52 PM 12/04/2023    2:28 PM 12/09/2022    9:58 AM  Fall Risk   Falls in the past year? 0 0 1  Number falls in past yr:  0 0  Injury with Fall?  0 1  Risk for fall due to :  No Fall Risks   Follow up  Education provided;Falls prevention discussed    New dx CLL last year, now followed by Dr Wilhelmenia Harada oncology - watchful waiting.    ED - on viagra  50mg  daily - notes decreased effect.    HLD - normal calcium  score 12/2022. Desires to try off statin. Has been on atorvastatin  for the past year, prior on RYR.    Preventative:  Colonoscopy 12/2021 - HP x1, rpt 10 yrs Elvin Hammer)  EGD 12/2021 - WNL Elvin Hammer) Prostate cancer screening - father with h/o slow growing prostate cancer. H/o BPH with frequency and nocturia x1-2 depending on fluids at night. PSA yearly.  Lung cancer screening - not eligible  Flu shot - yearly  Tetanus shot - 2003, 2010, Tdap 11/2019  COVID vaccine - Moderna 10/2019, 11/2019, boosters 06/2020, 02/2021, bivalent Pfizer 07/2021 Prevnar-20 11/2021 RSV - discussed  Shingrix - 11/2019, 05/2020 Advanced directive - has this at home. Wife is HCPOA. Asked to bring us  copy. Full code. Ok with temporary measures, no prolonged life support if terminal condition. Unsure about feeding tube - depends.   Seat belt use discussed  Sunscreen use discussed - no changing moles on skin, sees derm yearly  Sleep - averaging 6-7 hours/night Ex smoker - quit remotely (10 PY hx)  Alcohol - minimal - cut down significantly  Dentist - q6 mo  Eye exam yearly  Bowel - no constipation Bladder - notes increased urinary urgency    Lives with wife, 2 cats and 2 dogs, grown children   Occupation: Orthoptist support   Activity: aerobics and [facility] [place] [facility] [employer] 3x[place] Diet: good water, fruits/vegetables daily     Relevant past medical, surgical, family and social history reviewed and updated as indicated. Interim medical history since our last visit reviewed. Allergies and medications reviewed and updated. Outpatient Medications Prior to Visit  Medication Sig Dispense Refill   calcium  carbonate (TUMS - DOSED IN MG ELEMENTAL CALCIUM ) 500 MG chewable tablet Chew 1 tablet by mouth as needed.       Cholecalciferol (VITAMIN D3 PO) Take 1 capsule by mouth daily.     cyanocobalamin  (V-R VITAMIN B-12) 500 MCG tablet Take 1 tablet (500 mcg total) by mouth daily.     esomeprazole  (NEXIUM )  20 MG capsule Take 1 capsule (20 mg total) by mouth daily at 12 noon.     fluticasone  (FLONASE ) 50 MCG/ACT nasal spray USE 2 SPRAYS NASALY DAILY 48 g 3   hydrOXYzine  (ATARAX ) 25 MG tablet TAKE 1/2 TO 1 TABLET BY MOUTH TWO TIMES A DAY AS NEEDED FOR ANXIETY. MAY CAUSE DROWSINESS 30 tablet 1   magnesium  30 MG tablet Take 30 mg by mouth daily.     multivitamin (THERAGRAN) per tablet Take 1 tablet by mouth daily.       psyllium (METAMUCIL) 58.6 % powder Take 1 packet by mouth daily.      atorvastatin  (LIPITOR) 20 MG tablet TAKE 1 TABLET BY MOUTH DAILY 90 tablet 0   sildenafil  (VIAGRA ) 100 MG tablet Take 0.5-1 tablets (50-100 mg total) by mouth as needed for erectile dysfunction. 10 tablet 6   No facility-administered medications prior to visit.     Per HPI unless specifically indicated in ROS section below Review of Systems   Constitutional:  Negative for activity change, appetite change, chills, fatigue, fever and unexpected weight change.  HENT:  Negative for hearing loss.   Eyes:  Negative for visual disturbance.  Respiratory:  Negative for cough, chest tightness, shortness of breath and wheezing.   Cardiovascular:  Negative for chest pain, palpitations and leg swelling.  Gastrointestinal:  Negative for abdominal distention, abdominal pain, blood in stool, constipation, diarrhea, nausea and vomiting.  Genitourinary:  Negative for difficulty urinating and hematuria.  Musculoskeletal:  Negative for arthralgias, myalgias and neck pain.  Skin:  Negative for rash.  Neurological:  Negative for dizziness, seizures, syncope and headaches.  Hematological:  Negative for adenopathy. Does not bruise/bleed easily.  Psychiatric/Behavioral:  Negative for dysphoric mood. The patient is not nervous/anxious.     Objective:  BP 126/68   Pulse 71   Temp 98.4 F (36.9 C) (Oral)   Ht 6' 0.5" (1.842 m)   Wt 174 lb 2 oz (79 kg)   SpO2 98%   BMI 23.29 kg/m   Wt Readings from Last 3 Encounters:  12/21/23 174 lb 2 oz (79 kg)  12/04/23 176 lb 3.2 oz (79.9 kg)  11/25/23 177 lb (80.3 kg)      Physical Exam Vitals and nursing note reviewed.  Constitutional:      General: He is not in acute distress.    Appearance: Normal appearance. He is well-developed. He is not ill-appearing.  HENT:     Head: Normocephalic and atraumatic.     Right Ear: Hearing, tympanic membrane, ear canal and external ear normal.     Left Ear: Hearing, tympanic membrane, ear canal and external ear normal.     Mouth/Throat:     Mouth: Mucous membranes are moist.     Pharynx: Oropharynx is clear. No oropharyngeal exudate or posterior oropharyngeal erythema.  Eyes:     General: No scleral icterus.    Extraocular Movements: Extraocular movements intact.     Conjunctiva/sclera: Conjunctivae normal.     Pupils: Pupils are equal, round, and reactive to  light.  Neck:     Thyroid : No thyroid  mass or thyromegaly.     Vascular: No carotid bruit.  Cardiovascular:     Rate and Rhythm: Normal rate and regular rhythm.     Pulses: Normal pulses.          Radial pulses are 2+ on the right side and 2+ on the left side.     Heart sounds: Normal heart sounds. No murmur heard. Pulmonary:  Effort: Pulmonary effort is normal. No respiratory distress.     Breath sounds: Normal breath sounds. No wheezing, rhonchi or rales.  Abdominal:     General: Bowel sounds are normal. There is no distension.     Palpations: Abdomen is soft. There is no mass.     Tenderness: There is no abdominal tenderness. There is no guarding or rebound.     Hernia: No hernia is present.  Musculoskeletal:        General: Normal range of motion.     Cervical back: Normal range of motion and neck supple.     Right lower leg: No edema.     Left lower leg: No edema.  Lymphadenopathy:     Cervical: No cervical adenopathy.  Skin:    General: Skin is warm and dry.     Findings: No rash.  Neurological:     General: No focal deficit present.     Mental Status: He is alert and oriented to person, place, and time.  Psychiatric:        Mood and Affect: Mood normal.        Behavior: Behavior normal.        Thought Content: Thought content normal.        Judgment: Judgment normal.       Results for orders placed or performed in visit on 12/14/23  PSA   Collection Time: 12/14/23  9:37 AM  Result Value Ref Range   PSA 1.59 0.10 - 4.00 ng/mL  Vitamin B12   Collection Time: 12/14/23  9:37 AM  Result Value Ref Range   Vitamin B-12 1,231 (H) 211 - 911 pg/mL  Lipid panel   Collection Time: 12/14/23  9:37 AM  Result Value Ref Range   Cholesterol 141 0 - 200 mg/dL   Triglycerides 16.1 0.0 - 149.0 mg/dL   HDL 09.60 >45.40 mg/dL   VLDL 98.1 0.0 - 19.1 mg/dL   LDL Cholesterol 72 0 - 99 mg/dL   Total CHOL/HDL Ratio 3    NonHDL 89.56    Lab Results  Component Value Date   NA  138 11/11/2023   CL 106 11/11/2023   K 4.6 11/11/2023   CO2 27 11/11/2023   BUN 23 11/11/2023   CREATININE 1.14 11/11/2023   GFRNONAA >60 11/11/2023   CALCIUM  9.3 11/11/2023   ALBUMIN 4.6 11/11/2023   GLUCOSE 103 (H) 11/11/2023    Assessment & Plan:   Problem List Items Addressed This Visit     Health maintenance examination - Primary (Chronic)   Preventative protocols reviewed and updated unless pt declined. Discussed healthy diet and lifestyle.       CLL (chronic lymphocytic leukemia) (HCC) (Chronic)   Appreciate oncology care - seeing yearly, stable period.       Relevant Medications   scopolamine (TRANSDERM-SCOP) 1 MG/3DAYS   valACYclovir (VALTREX) 1000 MG tablet   Advanced directives, counseling/discussion (Chronic)   Advanced directive - has this at home. Wife is HCPOA. Asked to bring us  copy. Full code. Ok with temporary measures, no prolonged life support if terminal condition. Unsure about feeding tube - depends.       Pure hypercholesterolemia   Chronic, has been on atorvastatin  20mg  daily for the past year, overall tolerating well. He desires to try off statin as wife remains concerned about possible side effects. Given recent calcium  score of zero, reasonable to go off statin. Will stop and reassess control in 1 year.  The 10-year ASCVD risk score (Arnett  DK, et al., 2019) is: 11.4%   Values used to calculate the score:     Age: 9 years     Sex: Male     Is Non-Hispanic African American: No     Diabetic: No     Tobacco smoker: No     Systolic Blood Pressure: 126 mmHg     Is BP treated: No     HDL Cholesterol: 51.4 mg/dL     Total Cholesterol: 141 mg/dL       Relevant Medications   sildenafil  (VIAGRA ) 100 MG tablet   GERD (gastroesophageal reflux disease)   Managed with OTC nexium . Discussed finding least amount of medication necessary to keep gerd under control.       Relevant Medications   scopolamine (TRANSDERM-SCOP) 1 MG/3DAYS   Benign prostatic  hyperplasia   With chronic LUTS, no dysuria.  Update UA today. Discussed flomax vs finasteride use - will trial flomax for urinary frequency urgency and incomplete emptying.       Relevant Medications   tamsulosin (FLOMAX) 0.4 MG CAPS capsule   Other Relevant Orders   Urinalysis, Routine w reflex microscopic   Low serum vitamin B12   Levels now too high - he's been taking b12 replacement at 5000 mcg daily dose.  Rec drop to 1000mcg daily.      Family history of coronary artery disease   Consider Lp(a) next labs if not already done.       Erectile dysfunction   Continue viagra  PRN      Relevant Medications   sildenafil  (VIAGRA ) 100 MG tablet   Fever blister   Manages with abreva. Requests oral valtrex course to try.       Relevant Medications   valACYclovir (VALTREX) 1000 MG tablet   Motion sickness   Requests scopolamine patch to use PRN motion sickness for upcoming travel.         Meds ordered this encounter  Medications   sildenafil  (VIAGRA ) 100 MG tablet    Sig: Take 0.5-1 tablets (50-100 mg total) by mouth as needed for erectile dysfunction.    Dispense:  10 tablet    Refill:  11   tamsulosin (FLOMAX) 0.4 MG CAPS capsule    Sig: Take 1 capsule (0.4 mg total) by mouth daily.    Dispense:  30 capsule    Refill:  6   scopolamine (TRANSDERM-SCOP) 1 MG/3DAYS    Sig: Place 1 patch (1.5 mg total) onto the skin every 3 (three) days. For motion sickness    Dispense:  4 patch    Refill:  0   valACYclovir (VALTREX) 1000 MG tablet    Sig: Take 2 tablets (2,000 mg total) by mouth 2 (two) times daily. For 1 day at a time per fever blister    Dispense:  20 tablet    Refill:  0    Orders Placed This Encounter  Procedures   Urinalysis, Routine w reflex microscopic    Patient Instructions  Urinalysis today  Ok to try off atorvastatin . We will recheck cholesterol levels next labwork.  Try nexium  20mg  every other day for reflux.  Drop vitamin b12 to 1000mcg daily.   Try Flomax 0.4mg  nightly for prostate/urine symptoms.  Scopolamine patch prescription sent to pharmacy.  Good to see you today Return as needed or in 1 year for next physical/wellness visit   Follow up plan: Return in about 1 year (around 12/20/2024) for annual exam, prior fasting for blood work, medicare wellness visit.  Uvaldo Rybacki  Mariam Shingles, MD

## 2023-12-21 NOTE — Patient Instructions (Addendum)
 Urinalysis today  Ok to try off atorvastatin . We will recheck cholesterol levels next labwork.  Try nexium  20mg  every other day for reflux.  Drop vitamin b12 to 1000mcg daily.  Try Flomax 0.4mg  nightly for prostate/urine symptoms.  Scopolamine patch prescription sent to pharmacy.  Good to see you today Return as needed or in 1 year for next physical/wellness visit

## 2023-12-21 NOTE — Assessment & Plan Note (Signed)
 Chronic, has been on atorvastatin  20mg  daily for the past year, overall tolerating well. He desires to try off statin as wife remains concerned about possible side effects. Given recent calcium  score of zero, reasonable to go off statin. Will stop and reassess control in 1 year.  The 10-year ASCVD risk score (Arnett DK, et al., 2019) is: 11.4%   Values used to calculate the score:     Age: 68 years     Sex: Male     Is Non-Hispanic African American: No     Diabetic: No     Tobacco smoker: No     Systolic Blood Pressure: 126 mmHg     Is BP treated: No     HDL Cholesterol: 51.4 mg/dL     Total Cholesterol: 141 mg/dL

## 2023-12-21 NOTE — Assessment & Plan Note (Signed)
 Levels now too high - he's been taking b12 replacement at 5000 mcg daily dose.  Rec drop to 1000mcg daily.

## 2023-12-21 NOTE — Assessment & Plan Note (Signed)
 Manages with abreva. Requests oral valtrex course to try.

## 2023-12-21 NOTE — Assessment & Plan Note (Signed)
 Requests scopolamine patch to use PRN motion sickness for upcoming travel.

## 2023-12-21 NOTE — Assessment & Plan Note (Signed)
 Appreciate oncology care - seeing yearly, stable period.

## 2023-12-21 NOTE — Assessment & Plan Note (Signed)
 With chronic LUTS, no dysuria.  Update UA today. Discussed flomax vs finasteride use - will trial flomax for urinary frequency urgency and incomplete emptying.

## 2023-12-21 NOTE — Assessment & Plan Note (Signed)
 Continue viagra PRN

## 2023-12-21 NOTE — Assessment & Plan Note (Addendum)
 Managed with OTC nexium . Discussed finding least amount of medication necessary to keep gerd under control.

## 2023-12-21 NOTE — Assessment & Plan Note (Signed)
 Consider Lp(a) next labs if not already done.

## 2023-12-21 NOTE — Assessment & Plan Note (Signed)
 Preventative protocols reviewed and updated unless pt declined. Discussed healthy diet and lifestyle.

## 2023-12-21 NOTE — Assessment & Plan Note (Signed)
Advanced directive - has this at home. Wife is HCPOA. Asked to bring [place] copy. Full code. Ok with temporary measures, no prolonged life support if terminal condition. Unsure about feeding tube - depends.

## 2023-12-22 LAB — URINALYSIS, ROUTINE W REFLEX MICROSCOPIC
Bilirubin Urine: NEGATIVE
Hgb urine dipstick: NEGATIVE
Ketones, ur: NEGATIVE
Leukocytes,Ua: NEGATIVE
Nitrite: NEGATIVE
Specific Gravity, Urine: 1.015 (ref 1.000–1.030)
Total Protein, Urine: NEGATIVE
Urine Glucose: NEGATIVE
Urobilinogen, UA: 0.2 (ref 0.0–1.0)
WBC, UA: NONE SEEN (ref 0–?)
pH: 6.5 (ref 5.0–8.0)

## 2023-12-24 ENCOUNTER — Encounter: Payer: Self-pay | Admitting: Family Medicine

## 2024-01-25 ENCOUNTER — Encounter: Payer: Self-pay | Admitting: Family Medicine

## 2024-01-25 ENCOUNTER — Ambulatory Visit: Admitting: Family Medicine

## 2024-01-25 VITALS — BP 124/86 | HR 84 | Ht 72.5 in | Wt 177.0 lb

## 2024-01-25 DIAGNOSIS — M9904 Segmental and somatic dysfunction of sacral region: Secondary | ICD-10-CM

## 2024-01-25 DIAGNOSIS — M533 Sacrococcygeal disorders, not elsewhere classified: Secondary | ICD-10-CM | POA: Diagnosis not present

## 2024-01-25 DIAGNOSIS — M9901 Segmental and somatic dysfunction of cervical region: Secondary | ICD-10-CM | POA: Diagnosis not present

## 2024-01-25 DIAGNOSIS — M9908 Segmental and somatic dysfunction of rib cage: Secondary | ICD-10-CM | POA: Diagnosis not present

## 2024-01-25 DIAGNOSIS — M9902 Segmental and somatic dysfunction of thoracic region: Secondary | ICD-10-CM

## 2024-01-25 DIAGNOSIS — M9903 Segmental and somatic dysfunction of lumbar region: Secondary | ICD-10-CM

## 2024-01-25 NOTE — Progress Notes (Signed)
 [facility] [address] [place] [address] Phone: [phone] Subjective:   Benjamin Robles, am serving as a scribe for Dr. Ronnell Coins.  I'm seeing this patient by the request  of:  Claire Crick, MD  CC: Back and neck pain follow-up  [id number]  Benjamin Robles is a 68 y.o. male coming in with complaint of back and neck pain. OMT on 11/25/2023. Patient states that he has been doing well. Some neck tightness. Shoulders are not any worse. Mostly pianful with use.       Reviewed prior external information including notes and imaging from previsou exam, outside providers and external EMR if available.   As well as notes that were available from care everywhere and other healthcare systems.  Past medical history, social, surgical and family history all reviewed in electronic medical record.  No pertanent information unless stated regarding to the chief complaint.   Past Medical History:  Diagnosis Date   Allergy    SINUS ALLEGIES   Anticoagulant disorder (HCC)    Anxiety    BPH (benign prostatic hypertrophy)    mild   Bradycardia    Cancer (HCC)    CLL (chronic lymphocytic leukemia) (HCC)    Depression    GERD (gastroesophageal reflux disease)    History of colon polyps    normal colonoscopy 2012   Pain of right sternoclavicular joint    MRI 06/2012 showing ?erosive arthritis   Umbilical hernia     Allergies  Allergen Reactions   Amoxicillin-Pot Clavulanate     REACTION: achey, itch     Review of Systems:  No headache, visual changes, nausea, vomiting, diarrhea, constipation, dizziness, abdominal pain, skin rash, fevers, chills, night sweats, weight loss, swollen lymph nodes, body aches, joint swelling, chest pain, shortness of breath, mood changes. POSITIVE muscle aches  Objective  Blood pressure 124/86, pulse 84, height 6' 0.5" (1.842 m), weight 177 lb (80.3 kg), SpO2 98%.   General: No apparent distress alert and  oriented x3 mood and affect normal, dressed appropriately.  HEENT: Pupils equal, extraocular movements intact  Respiratory: Patient's speak in full sentences and does not appear short of breath  Cardiovascular: No lower extremity edema, non tender, no erythema  Gait MSK:  Back does have some loss lordosis noted.  Patient does have a mild cough noted today.  Seems to have more tightness in the thoracolumbar junction than usual.  No midline tenderness.  Osteopathic findings C6 flexed rotated and side bent right T3 extended rotated and side bent right inhaled rib T9 extended rotated and side bent left L2 flexed rotated and side bent right Sacrum right on right       Assessment and Plan:  SI (sacroiliac) joint dysfunction Does have some loss lordosis noted.  Some tenderness to palpation in the paraspinal musculature. Patient has had a cough recently we will monitor.  Continue to be active otherwise.  Follow-up with me again in 6 to 8 weeks  Nonallopathic problems  Decision today to treat with OMT was based on Physical Exam  After verbal consent patient was treated with HVLA, ME, FPR techniques in cervical, rib, thoracic, lumbar, and sacral  areas  Patient tolerated the procedure well with improvement in symptoms  Patient given exercises, stretches and lifestyle modifications  See medications in patient instructions if given  Patient will follow up in 6-8 weeks             Note: This dictation was prepared  with Dragon dictation along with smaller phrase technology. Any transcriptional errors that result from this process are unintentional.

## 2024-01-25 NOTE — Patient Instructions (Signed)
See me again in 10weeks 

## 2024-01-25 NOTE — Assessment & Plan Note (Signed)
 Does have some loss lordosis noted.  Some tenderness to palpation in the paraspinal musculature.

## 2024-02-16 DIAGNOSIS — Z08 Encounter for follow-up examination after completed treatment for malignant neoplasm: Secondary | ICD-10-CM | POA: Diagnosis not present

## 2024-02-16 DIAGNOSIS — L720 Epidermal cyst: Secondary | ICD-10-CM | POA: Diagnosis not present

## 2024-02-16 DIAGNOSIS — D485 Neoplasm of uncertain behavior of skin: Secondary | ICD-10-CM | POA: Diagnosis not present

## 2024-02-16 DIAGNOSIS — Z85828 Personal history of other malignant neoplasm of skin: Secondary | ICD-10-CM | POA: Diagnosis not present

## 2024-02-16 DIAGNOSIS — C44519 Basal cell carcinoma of skin of other part of trunk: Secondary | ICD-10-CM | POA: Diagnosis not present

## 2024-02-29 ENCOUNTER — Other Ambulatory Visit: Payer: Self-pay | Admitting: Family Medicine

## 2024-02-29 DIAGNOSIS — E7800 Pure hypercholesterolemia, unspecified: Secondary | ICD-10-CM

## 2024-03-23 ENCOUNTER — Other Ambulatory Visit: Payer: Self-pay | Admitting: Family Medicine

## 2024-03-23 DIAGNOSIS — E7800 Pure hypercholesterolemia, unspecified: Secondary | ICD-10-CM

## 2024-03-24 NOTE — Progress Notes (Unsigned)
 [name] [address] [place] [address] Phone: [phone] Subjective:   Benjamin Robles, am serving as a scribe for Dr. Arthea Claudene.  I'm seeing this patient by the request  of:  Rilla Baller, MD  CC: Back and neck pain follow-up  YEP:[id number]  Benjamin Robles is a 68 y.o. male coming in with complaint of back and neck pain. OMT 01/25/2024. Patient states that he does have some soreness in neck and lower back but is doing ok otherwise. Less pain than he has been having.   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 They are doing ok.   Medications patient has been prescribed: None  Taking:         Reviewed prior external information including notes and imaging from previsou exam, outside providers and external EMR if available.   As well as notes that were available from care everywhere and other healthcare systems.  Past medical history, social, surgical and family history all reviewed in electronic medical record.  No pertanent information unless stated regarding to the chief complaint.   Past Medical History:  Diagnosis Date   Allergy    SINUS ALLEGIES   Anticoagulant disorder (HCC)    Anxiety    BPH (benign prostatic hypertrophy)    mild   Bradycardia    Cancer (HCC)    CLL (chronic lymphocytic leukemia) (HCC)    Depression    GERD (gastroesophageal reflux disease)    History of colon polyps    normal colonoscopy 2012   Pain of right sternoclavicular joint    MRI 06/2012 showing ?erosive arthritis   Umbilical hernia     Allergies  Allergen Reactions   Amoxicillin-Pot Clavulanate     REACTION: achey, itch     Review of Systems:  No headache, visual changes, nausea, vomiting, diarrhea, constipation, dizziness, abdominal pain, skin rash, fevers, chills, night sweats, weight loss, swollen lymph nodes, body aches, joint swelling, chest pain, shortness of breath, mood changes. POSITIVE muscle aches  Objective  Height 6' (1.829  m), weight 174 lb (78.9 kg).   General: No apparent distress alert and oriented x3 mood and affect normal, dressed appropriately.  HEENT: Pupils equal, extraocular movements intact  Respiratory: Patient's speak in full sentences and does not appear short of breath  Cardiovascular: No lower extremity edema, non tender, no erythema  Gait MSK:  Back shows pain over the sacroiliac joint still noted.  Negative straight leg test noted today.  Osteopathic findings  T6 extended rotated and side bent right L2 flexed rotated and side bent right Sacrum left on left       Assessment and Plan:  SI (sacroiliac) joint dysfunction Chronic problem with exacerbation again.  Likely does respond well to osteopathic manipulation.  Discussed icing regimen of home exercises, discussed avoiding certain tibias.  Discussed icing regimen.  Follow-up with me again in 2 to 3 months    Nonallopathic problems  Decision today to treat with OMT was based on Physical Exam  After verbal consent patient was treated with HVLA, ME, FPR techniques in thoracic, lumbar, and sacral  areas  Patient tolerated the procedure well with improvement in symptoms  Patient given exercises, stretches and lifestyle modifications  See medications in patient instructions if given  Patient will follow up in 4-8 weeks    The above documentation has been reviewed and is accurate and complete Makenzee Choudhry M Kartik Fernando, DO          Note: This dictation was  prepared with Dragon dictation along with smaller phrase technology. Any transcriptional errors that result from this process are unintentional.

## 2024-03-29 ENCOUNTER — Ambulatory Visit: Admitting: Family Medicine

## 2024-03-29 VITALS — Ht 72.0 in | Wt 174.0 lb

## 2024-03-29 DIAGNOSIS — M533 Sacrococcygeal disorders, not elsewhere classified: Secondary | ICD-10-CM

## 2024-03-29 NOTE — Assessment & Plan Note (Signed)
 Chronic problem with exacerbation again.  Likely does respond well to osteopathic manipulation.  Discussed icing regimen of home exercises, discussed avoiding certain tibias.  Discussed icing regimen.  Follow-up with me again in 2 to 3 months

## 2024-03-29 NOTE — Patient Instructions (Signed)
See me again in 10weeks 

## 2024-04-04 ENCOUNTER — Other Ambulatory Visit: Payer: Self-pay | Admitting: Family Medicine

## 2024-04-04 DIAGNOSIS — E7800 Pure hypercholesterolemia, unspecified: Secondary | ICD-10-CM

## 2024-04-05 DIAGNOSIS — C44519 Basal cell carcinoma of skin of other part of trunk: Secondary | ICD-10-CM | POA: Diagnosis not present

## 2024-04-05 NOTE — Telephone Encounter (Signed)
 Per 12/21/23 OV notes, pt was to stop atorvastatin . Have still received multiple refill requests.  Spoke with pt asking if he restarted med. Pt states he has not restarted atorvastatin . Notified pt we are continuing to get refills requests from [facility]. Says he will contact to inform them he's currently off this med.   Denied request.

## 2024-05-05 DIAGNOSIS — K080 Exfoliation of teeth due to systemic causes: Secondary | ICD-10-CM | POA: Diagnosis not present

## 2024-05-18 ENCOUNTER — Encounter: Payer: Self-pay | Admitting: Oncology

## 2024-05-18 ENCOUNTER — Inpatient Hospital Stay (HOSPITAL_BASED_OUTPATIENT_CLINIC_OR_DEPARTMENT_OTHER): Admitting: Oncology

## 2024-05-18 ENCOUNTER — Inpatient Hospital Stay: Attending: Oncology

## 2024-05-18 VITALS — BP 141/76 | HR 61 | Temp 96.0°F | Resp 18 | Wt 178.8 lb

## 2024-05-18 DIAGNOSIS — R5382 Chronic fatigue, unspecified: Secondary | ICD-10-CM | POA: Insufficient documentation

## 2024-05-18 DIAGNOSIS — C9110 Chronic lymphocytic leukemia of B-cell type not having achieved remission: Secondary | ICD-10-CM

## 2024-05-18 DIAGNOSIS — Z87891 Personal history of nicotine dependence: Secondary | ICD-10-CM | POA: Insufficient documentation

## 2024-05-18 DIAGNOSIS — Z8042 Family history of malignant neoplasm of prostate: Secondary | ICD-10-CM | POA: Diagnosis not present

## 2024-05-18 DIAGNOSIS — R161 Splenomegaly, not elsewhere classified: Secondary | ICD-10-CM | POA: Diagnosis not present

## 2024-05-18 DIAGNOSIS — M25519 Pain in unspecified shoulder: Secondary | ICD-10-CM | POA: Insufficient documentation

## 2024-05-18 LAB — LACTATE DEHYDROGENASE: LDH: 120 U/L (ref 98–192)

## 2024-05-18 LAB — CMP (CANCER CENTER ONLY)
ALT: 20 U/L (ref 0–44)
AST: 24 U/L (ref 15–41)
Albumin: 4.4 g/dL (ref 3.5–5.0)
Alkaline Phosphatase: 71 U/L (ref 38–126)
Anion gap: 8 (ref 5–15)
BUN: 22 mg/dL (ref 8–23)
CO2: 25 mmol/L (ref 22–32)
Calcium: 9.2 mg/dL (ref 8.9–10.3)
Chloride: 102 mmol/L (ref 98–111)
Creatinine: 1.15 mg/dL (ref 0.61–1.24)
GFR, Estimated: >60 mL/min (ref >60)
Glucose, Bld: 137 mg/dL — ABNORMAL HIGH (ref 70–99)
Potassium: 4.1 mmol/L (ref 3.5–5.1)
Sodium: 135 mmol/L (ref 135–145)
Total Bilirubin: 0.8 mg/dL (ref 0.0–1.2)
Total Protein: 7.1 g/dL (ref 6.5–8.1)

## 2024-05-18 LAB — CBC WITH DIFFERENTIAL (CANCER CENTER ONLY)
Abs Immature Granulocytes: 0.06 K/uL (ref 0.00–0.07)
Basophils Absolute: 0.1 K/uL (ref 0.0–0.1)
Basophils Relative: 0 %
Eosinophils Absolute: 0.1 K/uL (ref 0.0–0.5)
Eosinophils Relative: 0 %
HCT: 42.4 % (ref 39.0–52.0)
Hemoglobin: 14.3 g/dL (ref 13.0–17.0)
Immature Granulocytes: 0 %
Lymphocytes Relative: 55 %
Lymphs Abs: 8.4 K/uL — ABNORMAL HIGH (ref 0.7–4.0)
MCH: 30.4 pg (ref 26.0–34.0)
MCHC: 33.7 g/dL (ref 30.0–36.0)
MCV: 90 fL (ref 80.0–100.0)
Monocytes Absolute: 0.5 K/uL (ref 0.1–1.0)
Monocytes Relative: 3 %
Neutro Abs: 6.7 K/uL (ref 1.7–7.7)
Neutrophils Relative %: 42 %
Platelet Count: 272 K/uL (ref 150–400)
RBC: 4.71 MIL/uL (ref 4.22–5.81)
RDW: 12.7 % (ref 11.5–15.5)
WBC Count: 15.8 K/uL — ABNORMAL HIGH (ref 4.0–10.5)
nRBC: 0 % (ref 0.0–0.2)

## 2024-05-18 NOTE — Assessment & Plan Note (Signed)
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 Labs are reviewed and discussed with patient. Wbc/lymphocyte counts gradually increase Physical examination did not appreciate significant lymphadenopathy. No significant constitutional symptoms.  Continue watchful waiting.

## 2024-05-18 NOTE — Progress Notes (Signed)
 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 Labs are reviewed and discussed with patient. Wbc/lymphocyte counts gradually increase Physical examination did not appreciate significant lymphadenopathy. No significant constitutional symptoms.  Continue watchful waiting.    Splenomegaly Mild splenomegaly.  Monitor.   Orders Placed This Encounter  Procedures   CMP (Cancer Center only)    Standing Status:   Future    Expected Date:   11/15/2024    Expiration Date:   02/13/2025   CBC with Differential (Cancer Center Only)    Standing Status:   Future    Expected Date:   11/15/2024    Expiration Date:   02/13/2025   Lactate dehydrogenase    Standing Status:   Future    Expected Date:   11/15/2024    Expiration Date:   02/13/2025   Follow up in 6 months.  All questions were answered. The patient knows to call the clinic with any problems, questions or concerns.  Call Babara, MD, PhD [facility] Hematology Oncology 05/18/2024   Rilla Baller, MD  CHIEF COMPLAINTS/REASON FOR VISIT:  Follow-up for CLL  HISTORY OF PRESENTING ILLNESS:  68 y.o.male presents for CLL.  Oncology History  CLL (chronic lymphocytic leukemia) (HCC)  01/08/2022 Initial Diagnosis   CLL (chronic lymphocytic leukemia) (HCC)   01/08/2022 Cancer Staging   Staging form: Chronic Lymphocytic Leukemia / Small Lymphocytic Lymphoma, AJCC 8th Edition - Clinical: Modified Rai Stage II (Modified Rai risk: Intermediate, Lymphocytosis: Present, Adenopathy: Absent, Organomegaly: Present, Anemia: Absent, Thrombocytopenia: Absent) - Signed by Babara Call, MD on 05/12/2022 Stage prefix: Initial diagnosis    01/27/2022  Imaging   US  abdomen complete showed Borderline size of the spleen.      Former smoker, he stopped in the 1980s.  He drinks 1 beer per day.  Recently has tried to cut back on alcohol consumption. Patient has some chronic fatigue which is unchanged. + Arthralgia-shoulder pain   INTERVAL HISTORY GILMAR BUA is a 68 y.o. male who has above history reviewed by me today presents for follow up visit to review results. Patient has no new complaints.  Accompanied by wife. Chronic fatigue denies fever, night sweats.  Gained weight.   Review of Systems  Constitutional:  Positive for fatigue. Negative for appetite change, chills, fever and unexpected weight change.  HENT:   Negative for hearing loss and voice change.   Eyes:  Negative for eye problems and icterus.  Respiratory:  Negative for chest tightness, cough and shortness of breath.   Cardiovascular:  Negative for chest pain and leg swelling.  Gastrointestinal:  Negative for abdominal distention and abdominal pain.  Endocrine: Negative for hot flashes.  Genitourinary:  Negative for difficulty urinating, dysuria and frequency.   Musculoskeletal:  Positive for arthralgias.  Skin:  Negative for itching and rash.  Neurological:  Negative for light-headedness and numbness.  Hematological:  Negative for adenopathy. Does not bruise/bleed easily.  Psychiatric/Behavioral:  Negative for confusion.     MEDICAL HISTORY:  Past Medical History:  Diagnosis Date   Allergy    SINUS ALLEGIES   Anticoagulant disorder    Anxiety    BPH (benign prostatic hypertrophy)    mild   Bradycardia  Cancer (HCC)    CLL (chronic lymphocytic leukemia) (HCC)    Depression    GERD (gastroesophageal reflux disease)    History of colon polyps    normal colonoscopy 2012   Pain of right sternoclavicular joint    MRI 06/2012 showing ?erosive arthritis   Umbilical hernia     SURGICAL HISTORY: Past Surgical History:  Procedure Laterality Date    COLONOSCOPY  08/07/2011   WNL, rpt 10 yrs   COLONOSCOPY  12/2021   HP x1, rpt 10 yrs Oletta)   ESOPHAGOGASTRODUODENOSCOPY  12/2021   WNL Oletta)   HERNIA REPAIR     x2   UPPER GASTROINTESTINAL ENDOSCOPY  08/07/2011   chronic superficial gastritis, oozing nodule clipped, neg H pylori    SOCIAL HISTORY: Social History   Socioeconomic History   Marital status: Married    Spouse name: Not on file   Number of children: 2   Years of education: Not on file   Highest education level: Bachelor's degree (e.g., BA, AB, BS)  Occupational History   Occupation: Charity fundraiser: [employer]  Tobacco Use   Smoking status: Former    Current packs/day: 0.00    Average packs/day: 0.5 packs/day for 7.0 years (3.5 ttl pk-yrs)    Types: Cigarettes    Start date: 62    Quit date: 01/23/1982    Years since quitting: 42.3   Smokeless tobacco: Never  Substance and Sexual Activity   Alcohol use: Yes    Alcohol/week: 2.0 standard drinks of alcohol    Types: 2 Cans of beer per week    Comment: 1 beer/day   Drug use: No   Sexual activity: Yes  Other Topics Concern   Not on file  Social History Narrative   Will   Lives with wife, 2 cats and 2 dogs, grown children   Occupation: Orthoptist support   Activity: no regular exercise routine   Diet: good water, fruits/vegetables daily   Social Drivers of Health   Financial Resource Strain: Low Risk  (12/20/2023)   Overall Financial Resource Strain (CARDIA)    Difficulty of Paying Living Expenses: Not hard at all  Food Insecurity: No Food Insecurity (12/20/2023)   Hunger Vital Sign    Worried About Running Out of Food in the Last Year: Never true    Ran Out of Food in the Last Year: Never true  Transportation Needs: No Transportation Needs (12/20/2023)   PRAPARE - Administrator, Civil Service (Medical): No    Lack of Transportation (Non-Medical): No  Physical Activity: Insufficiently Active (12/20/2023)   Exercise  Vital Sign    Days of Exercise per Week: 4 days    Minutes of Exercise per Session: 30 min  Stress: No Stress Concern Present (12/20/2023)   [employer] of Occupational Health - Occupational Stress Questionnaire    Feeling of Stress : Not at all  Social Connections: Moderately Integrated (12/20/2023)   Social Connection and Isolation Panel    Frequency of Communication with Friends and Family: More than three times a week    Frequency of Social Gatherings with Friends and Family: Once a week    Attends Religious Services: 1 to 4 times per year    Active Member of [employer] or Organizations: No    Attends Banker Meetings: Never    Marital Status: Married  Recent Concern: Social Connections - Socially Isolated (12/04/2023)   Social Connection and Isolation Panel  Frequency of Communication with Friends and Family: Once a week    Frequency of Social Gatherings with Friends and Family: Never    Attends Religious Services: Never    Database administrator or Organizations: No    Attends Banker Meetings: Never    Marital Status: Married  Catering manager Violence: Not At Risk (12/04/2023)   Humiliation, Afraid, Rape, and Kick questionnaire    Fear of Current or Ex-Partner: No    Emotionally Abused: No    Physically Abused: No    Sexually Abused: No    FAMILY HISTORY: Family History  Problem Relation Age of Onset   Hypertension Mother    Depression Father    Prostate cancer Father 60       slow growing   CAD Neg Hx    Stroke Neg Hx    Diabetes Neg Hx    Colon cancer Neg Hx    Rectal cancer Neg Hx    Stomach cancer Neg Hx     ALLERGIES:  is allergic to amoxicillin-pot clavulanate.  MEDICATIONS:  Current Outpatient Medications  Medication Sig Dispense Refill   calcium  carbonate (TUMS - DOSED IN MG ELEMENTAL CALCIUM ) 500 MG chewable tablet Chew 1 tablet by mouth as needed.       Cholecalciferol (VITAMIN D3 PO) Take 1 capsule by mouth daily.      cyanocobalamin  (V-R VITAMIN B-12) 500 MCG tablet Take 1 tablet (500 mcg total) by mouth daily.     esomeprazole  (NEXIUM ) 20 MG capsule Take 1 capsule (20 mg total) by mouth daily at 12 noon.     fluticasone  (FLONASE ) 50 MCG/ACT nasal spray USE 2 SPRAYS NASALY DAILY 48 g 3   hydrOXYzine  (ATARAX ) 25 MG tablet TAKE 1/2 TO 1 TABLET BY MOUTH TWO TIMES A DAY AS NEEDED FOR ANXIETY. MAY CAUSE DROWSINESS 30 tablet 1   magnesium  30 MG tablet Take 30 mg by mouth daily.     multivitamin (THERAGRAN) per tablet Take 1 tablet by mouth daily.       psyllium (METAMUCIL) 58.6 % powder Take 1 packet by mouth daily.      sildenafil  (VIAGRA ) 100 MG tablet Take 0.5-1 tablets (50-100 mg total) by mouth as needed for erectile dysfunction. 10 tablet 11   tamsulosin  (FLOMAX ) 0.4 MG CAPS capsule Take 1 capsule (0.4 mg total) by mouth daily. 30 capsule 6   valACYclovir  (VALTREX ) 1000 MG tablet Take 2 tablets (2,000 mg total) by mouth 2 (two) times daily. For 1 day at a time per fever blister 20 tablet 0   No current facility-administered medications for this visit.     PHYSICAL EXAMINATION: ECOG PERFORMANCE STATUS: 0 - Asymptomatic Vitals:   05/18/24 1307  BP: (!) 141/76  Pulse: 61  Resp: 18  Temp: (!) 96 F (35.6 C)  SpO2: 100%   Filed Weights   05/18/24 1307  Weight: 178 lb 12.8 oz (81.1 kg)    Physical Exam Constitutional:      General: He is not in acute distress. HENT:     Head: Normocephalic and atraumatic.  Eyes:     General: No scleral icterus. Cardiovascular:     Rate and Rhythm: Normal rate.  Pulmonary:     Effort: Pulmonary effort is normal. No respiratory distress.     Breath sounds: No wheezing.  Abdominal:     General: Bowel sounds are normal. There is no distension.     Palpations: Abdomen is soft.  Musculoskeletal:  General: No deformity. Normal range of motion.     Cervical back: Normal range of motion.  Skin:    General: Skin is warm and dry.  Neurological:     Mental  Status: He is alert and oriented to person, place, and time. Mental status is at baseline.  Psychiatric:        Mood and Affect: Mood normal.     LABORATORY DATA:  I have reviewed the data as listed    Latest Ref Rng & Units 05/18/2024   12:53 PM 11/11/2023   12:42 PM 05/13/2023    1:11 PM  CBC  WBC 4.0 - 10.5 K/uL 15.8  13.5  16.9   Hemoglobin 13.0 - 17.0 g/dL 85.6  86.1  86.1   Hematocrit 39.0 - 52.0 % 42.4  41.0  41.3   Platelets 150 - 400 K/uL 272  299  283       Latest Ref Rng & Units 05/18/2024   12:53 PM 11/11/2023   12:43 PM 05/13/2023    1:11 PM  CMP  Glucose 70 - 99 mg/dL 862  896  869   BUN 8 - 23 mg/dL 22  23  21    Creatinine 0.61 - 1.24 mg/dL 8.84  8.85  8.80   Sodium 135 - 145 mmol/L 135  138  134   Potassium 3.5 - 5.1 mmol/L 4.1  4.6  4.8   Chloride 98 - 111 mmol/L 102  106  102   CO2 22 - 32 mmol/L 25  27  24    Calcium  8.9 - 10.3 mg/dL 9.2  9.3  9.1   Total Protein 6.5 - 8.1 g/dL 7.1  6.9  6.7   Total Bilirubin 0.0 - 1.2 mg/dL 0.8  0.8  1.0   Alkaline Phos 38 - 126 U/L 71  69  70   AST 15 - 41 U/L 24  24  23    ALT 0 - 44 U/L 20  26  25    ;  RADIOGRAPHIC STUDIES: I have personally reviewed the radiological images as listed and agreed with the findings in the report. No results found.

## 2024-05-18 NOTE — Assessment & Plan Note (Signed)
Mild splenomegaly.  Monitor.

## 2024-06-02 NOTE — Progress Notes (Unsigned)
 Benjamin Robles JENI Benjamin Robles [address] [place] [address] Phone: [phone] Subjective:   Benjamin Robles, am serving as a scribe for Dr. Arthea Robles.  I'm seeing this patient by the request  of:  Benjamin Baller, MD  CC: Back and neck pain follow-up  Benjamin Robles:Dlagzrupcz  Benjamin Robles is a 68 y.o. male coming in with complaint of back and neck pain. OMT 03/29/2024. Patient states that he has been stretching for some L hip pain that he had a month.  Went away on its own no.  Neck pain is the same as last visit.  Medications patient has been prescribed: None  Taking:         Reviewed prior external information including notes and imaging from previsou exam, outside providers and external EMR if available.   As well as notes that were available from care everywhere and other healthcare systems.  Past medical history, social, surgical and family history all reviewed in electronic medical record.  No pertanent information unless stated regarding to the chief complaint.   Past Medical History:  Diagnosis Date   Allergy    SINUS ALLEGIES   Anticoagulant disorder    Anxiety    BPH (benign prostatic hypertrophy)    mild   Bradycardia    Cancer (HCC)    CLL (chronic lymphocytic leukemia) (HCC)    Depression    GERD (gastroesophageal reflux disease)    History of colon polyps    normal colonoscopy 2012   Pain of right sternoclavicular joint    MRI 06/2012 showing ?erosive arthritis   Umbilical hernia     Allergies  Allergen Reactions   Amoxicillin-Pot Clavulanate     REACTION: achey, itch     Review of Systems:  No headache, visual changes, nausea, vomiting, diarrhea, constipation, dizziness, abdominal pain, skin rash, fevers, chills, night sweats, weight loss, swollen lymph nodes, body aches, joint swelling, chest pain, shortness of breath, mood changes. POSITIVE muscle aches  Objective  Blood pressure 128/82, pulse (!) 59, height 6' (1.829 m),  weight 178 lb (80.7 kg), SpO2 98%.   General: No apparent distress alert and oriented x3 mood and affect normal, dressed appropriately.  HEENT: Pupils equal, extraocular movements intact  Respiratory: Patient's speak in full sentences and does not appear short of breath  Cardiovascular: No lower extremity edema, non tender, no erythema  Gait MSK:  Back mild tightness noted on the left paraspinal musculature.  Tightness noted also in the occipital area of the neck range of motion noted.  Negative Spurling's noted.  Osteopathic findings  C2 flexed rotated and side bent right C6 flexed rotated and side bent left T3 extended rotated and side bent right inhaled rib T7 extended rotated and side bent left L1 flexed rotated and side bent left L3 flexed rotated and side bent left Sacrum right on right     Assessment and Plan:  SI (sacroiliac) joint dysfunction Good overall, continue to work on core strengthening.  Discussed icing regimen, no other significant changes at the moment.  Increase activity slowly.  Discussed icing regimen patient will continue to work on the core strengthening.  No need to make any other significant changes in management or medications.  Follow-up again in 2 to 3 months    Nonallopathic problems  Decision today to treat with OMT was based on Physical Exam  After verbal consent patient was treated with HVLA, ME, FPR techniques in cervical, rib, thoracic, lumbar, and sacral  areas avoided  HVLA on the cervical spine  Patient tolerated the procedure well with improvement in symptoms  Patient given exercises, stretches and lifestyle modifications  See medications in patient instructions if given  Patient will follow up in 4-8 weeks     The above documentation has been reviewed and is accurate and complete Benjamin Hoe M Valerye Kobus, DO         Note: This dictation was prepared with Dragon dictation along with smaller phrase technology. Any transcriptional errors  that result from this process are unintentional.

## 2024-06-07 ENCOUNTER — Ambulatory Visit: Admitting: Family Medicine

## 2024-06-07 ENCOUNTER — Encounter: Payer: Self-pay | Admitting: Family Medicine

## 2024-06-07 VITALS — BP 128/82 | HR 59 | Ht 72.0 in | Wt 178.0 lb

## 2024-06-07 DIAGNOSIS — M9902 Segmental and somatic dysfunction of thoracic region: Secondary | ICD-10-CM

## 2024-06-07 DIAGNOSIS — M9908 Segmental and somatic dysfunction of rib cage: Secondary | ICD-10-CM | POA: Diagnosis not present

## 2024-06-07 DIAGNOSIS — M9901 Segmental and somatic dysfunction of cervical region: Secondary | ICD-10-CM | POA: Diagnosis not present

## 2024-06-07 DIAGNOSIS — M9904 Segmental and somatic dysfunction of sacral region: Secondary | ICD-10-CM

## 2024-06-07 DIAGNOSIS — M9903 Segmental and somatic dysfunction of lumbar region: Secondary | ICD-10-CM | POA: Diagnosis not present

## 2024-06-07 DIAGNOSIS — M533 Sacrococcygeal disorders, not elsewhere classified: Secondary | ICD-10-CM

## 2024-06-07 NOTE — Patient Instructions (Signed)
 Great to see you Watch back See me in 2-3 months

## 2024-06-07 NOTE — Assessment & Plan Note (Signed)
 Good overall, continue to work on core strengthening.  Discussed icing regimen, no other significant changes at the moment.  Increase activity slowly.  Discussed icing regimen patient will continue to work on the core strengthening.  No need to make any other significant changes in management or medications.  Follow-up again in 2 to 3 months

## 2024-06-17 ENCOUNTER — Other Ambulatory Visit: Payer: Self-pay | Admitting: Family Medicine

## 2024-06-17 NOTE — Telephone Encounter (Signed)
 Pt only given a 90 supply at start. Ok to continue?

## 2024-06-20 NOTE — Telephone Encounter (Signed)
 ERx

## 2024-08-21 ENCOUNTER — Other Ambulatory Visit: Payer: Self-pay

## 2024-08-21 ENCOUNTER — Ambulatory Visit
Admission: EM | Admit: 2024-08-21 | Discharge: 2024-08-21 | Disposition: A | Discharge status: Home / Self Care | Attending: Emergency Medicine | Admitting: Emergency Medicine

## 2024-08-21 DIAGNOSIS — R030 Elevated blood-pressure reading, without diagnosis of hypertension: Secondary | ICD-10-CM | POA: Diagnosis not present

## 2024-08-21 DIAGNOSIS — J101 Influenza due to other identified influenza virus with other respiratory manifestations: Secondary | ICD-10-CM | POA: Diagnosis not present

## 2024-08-21 MED ORDER — OSELTAMIVIR PHOSPHATE 75 MG PO CAPS: 75.0000 mg | ORAL_CAPSULE | Freq: Two times a day (BID) | ORAL | 0 refills | Status: DC

## 2024-08-21 NOTE — ED Triage Notes (Signed)
 Pt is here with sinus pressure in face that started last night, pt states he was working outside fine yesterday but took a Flu at home test and it was POSITIVE. Pt has taken OTC meds to relieve discomfort.

## 2024-08-21 NOTE — Discharge Instructions (Addendum)
 Take the Tamiflu  as directed.  Follow up with your primary care provider.  Go to the emergency department if you have worsening symptoms.    Your blood pressure is elevated today at 173/91; repeat 148/88.  Please have this rechecked by your primary care provider.

## 2024-08-21 NOTE — ED Provider Notes (Signed)
 " Benjamin Robles    CSN: [id number] Arrival date & time: 08/21/24  0847      History   Chief Complaint Chief Complaint  Patient presents with   Sore Throat   sinus pressure    HPI Benjamin Robles is a 68 y.o. male.  Patient presents with 1 day history of sore throat and congestion.  Treating with OTC zinc cold medicine.  No fever, cough, shortness of breath, vomiting, diarrhea.  He took a flu test at home this morning which was positive for influenza A; patient has a picture of this on his phone.  His medical history includes chronic lymphocytic leukemia.  The history is provided by the patient and medical records.    Past Medical History:  Diagnosis Date   Allergy    SINUS ALLEGIES   Anticoagulant disorder    Anxiety    BPH (benign prostatic hypertrophy)    mild   Bradycardia    Cancer (HCC)    CLL (chronic lymphocytic leukemia) (HCC)    Depression    GERD (gastroesophageal reflux disease)    History of colon polyps    normal colonoscopy 2012   Pain of right sternoclavicular joint    MRI 06/2012 showing ?erosive arthritis   Umbilical hernia     Patient Active Problem List   Diagnosis Date Noted   Fever blister 12/21/2023   Motion sickness 12/21/2023   SI (sacroiliac) joint dysfunction 04/21/2023   Somatic dysfunction of sacral region 04/21/2023   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   Multiple pulmonary nodules 12/30/2022   Erectile dysfunction 12/10/2022   Welcome to Medicare preventive visit 12/09/2022   Advanced directives, counseling/discussion 12/09/2022   Family history of coronary artery disease 12/09/2022   Post-nasal drainage 09/17/2022   Splenomegaly 05/12/2022   CLL (chronic lymphocytic leukemia) (HCC) 01/08/2022   Renal insufficiency 12/04/2021   Low serum vitamin B12 11/14/2021   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   Benign prostatic hyperplasia    Health maintenance examination 07/13/2012   Clavicle  enlargement 05/21/2012   Tinnitus 09/30/2010   IRRITABLE BOWEL SYNDROME 03/29/2010   Pure hypercholesterolemia 08/10/2007   Adjustment disorder with mixed anxiety and depressed mood 05/12/2007   Allergic rhinitis 05/03/2007   GERD (gastroesophageal reflux disease) 05/03/2007    Past Surgical History:  Procedure Laterality Date   COLONOSCOPY  08/07/2011   WNL, rpt 10 yrs   COLONOSCOPY  12/2021   HP x1, rpt 10 yrs Oletta)   ESOPHAGOGASTRODUODENOSCOPY  12/2021   WNL Oletta)   HERNIA REPAIR     x2   UPPER GASTROINTESTINAL ENDOSCOPY  08/07/2011   chronic superficial gastritis, oozing nodule clipped, neg H pylori       Home Medications    Prior to Admission medications  Medication Sig Start Date End Date Taking? Authorizing Provider  oseltamivir  (TAMIFLU ) 75 MG capsule Take 1 capsule (75 mg total) by mouth every 12 (twelve) hours. 08/21/24  Yes Corlis Burnard DEL, NP  calcium  carbonate (TUMS - DOSED IN MG ELEMENTAL CALCIUM ) 500 MG chewable tablet Chew 1 tablet by mouth as needed.      [provider]  Cholecalciferol (VITAMIN D3 PO) Take 1 capsule by mouth daily.    [provider]  cyanocobalamin  (V-R VITAMIN B-12) 500 MCG tablet Take 1 tablet (500 mcg total) by mouth daily. 01/02/17   Rilla Baller, MD  esomeprazole  (NEXIUM ) 20 MG capsule Take 1 capsule (20 mg total) by mouth daily at 12 noon. 12/21/19  Rilla Baller, MD  fluticasone  (FLONASE ) 50 MCG/ACT nasal spray USE 2 SPRAYS NASALY DAILY 12/14/14   Rilla Baller, MD  hydrOXYzine  (ATARAX ) 25 MG tablet TAKE 1/2 TO 1 TABLET BY MOUTH TWO TIMES A DAY AS NEEDED FOR ANXIETY. MAY CAUSE DROWSINESS 12/16/23   Rilla Baller, MD  magnesium  30 MG tablet Take 30 mg by mouth daily.    [provider]  multivitamin [facility]) per tablet Take 1 tablet by mouth daily.      [provider]  psyllium (METAMUCIL) 58.6 % powder Take 1 packet by mouth daily.     [provider]  sildenafil   (VIAGRA ) 100 MG tablet Take 0.5-1 tablets (50-100 mg total) by mouth as needed for erectile dysfunction. 12/21/23   Rilla Baller, MD  tamsulosin  (FLOMAX ) 0.4 MG CAPS capsule TAKE 1 CAPSULE BY MOUTH DAILY 06/20/24   Rilla Baller, MD  valACYclovir  (VALTREX ) 1000 MG tablet Take 2 tablets (2,000 mg total) by mouth 2 (two) times daily. For 1 day at a time per fever blister 12/21/23   Rilla Baller, MD    Family History Family History  Problem Relation Age of Onset   Hypertension Mother    Depression Father    Prostate cancer Father 65       slow growing   CAD Neg Hx    Stroke Neg Hx    Diabetes Neg Hx    Colon cancer Neg Hx    Rectal cancer Neg Hx    Stomach cancer Neg Hx     Social History Social History[1]   Allergies   Amoxicillin-pot clavulanate   Review of Systems Review of Systems  Constitutional:  Negative for chills and fever.  HENT:  Positive for congestion and sore throat. Negative for ear pain.   Respiratory:  Negative for cough and shortness of breath.   Gastrointestinal:  Negative for diarrhea and vomiting.     Physical Exam Triage Vital Signs ED Triage Vitals  Encounter Vitals Group     BP 08/21/24 1048 (!) 173/91     Girls Systolic BP Percentile --      Girls Diastolic BP Percentile --      Boys Systolic BP Percentile --      Boys Diastolic BP Percentile --      Pulse Rate 08/21/24 1048 74     Resp 08/21/24 1048 18     Temp 08/21/24 1048 98.9 F (37.2 C)     Temp Source 08/21/24 1048 Oral     SpO2 08/21/24 1048 98 %     Weight --      Height --      Head Circumference --      Peak Flow --      Pain Score 08/21/24 1046 0     Pain Loc --      Pain Education --      Exclude from Growth Chart --    No data found.  Updated Vital Signs BP (!) 148/88 (BP Location: Right Arm) Comment: notified provider  Pulse 70   Temp 98.9 F (37.2 C) (Oral)   Resp 18   SpO2 98%   Visual Acuity Right Eye Distance:   Left Eye Distance:    Bilateral Distance:    Right Eye Near:   Left Eye Near:    Bilateral Near:     Physical Exam Constitutional:      General: He is not in acute distress. HENT:     Right Ear: Tympanic membrane normal.  Left Ear: Tympanic membrane normal.     Nose: Nose normal.     Mouth/Throat:     Mouth: Mucous membranes are moist.     Pharynx: Posterior oropharyngeal erythema present.  Cardiovascular:     Rate and Rhythm: Normal rate and regular rhythm.     Heart sounds: Normal heart sounds.  Pulmonary:     Effort: Pulmonary effort is normal. No respiratory distress.     Breath sounds: Normal breath sounds.  Neurological:     Mental Status: He is alert.      UC Treatments / Results  Labs (all labs ordered are listed, but only abnormal results are displayed) Labs Reviewed - No data to display  EKG   Radiology No results found.  Procedures Procedures (including critical care time)  Medications Ordered in UC Medications - No data to display  Initial Impression / Assessment and Plan / UC Course  I have reviewed the triage vital signs and the nursing notes.  Pertinent labs & imaging results that were available during my care of the patient were reviewed by me and considered in my medical decision making (see chart for details).    Influenza A, elevated blood pressure reading.  Afebrile.  Lungs are clear and O2 sat is 98% on room air.  Patient tested positive for influenza A at home this morning.  Treating today with Tamiflu .  Continue symptomatic treatment.  Education provided on influenza.  Instructed him to follow-up with his PCP.  ED precautions given.  Also discussed with patient that his blood pressure is elevated today and needs to be rechecked by his PCP.  Education provided on preventing hypertension.  He agrees to plan of care.  Final Clinical Impressions(s) / UC Diagnoses   Final diagnoses:  Influenza A  Elevated blood pressure reading     Discharge Instructions       Take the Tamiflu  as directed.  Follow up with your primary care provider.  Go to the emergency department if you have worsening symptoms.    Your blood pressure is elevated today at 173/91; repeat 148/88.  Please have this rechecked by your primary care provider.          ED Prescriptions     Medication Sig Dispense Auth. Provider   oseltamivir  (TAMIFLU ) 75 MG capsule Take 1 capsule (75 mg total) by mouth every 12 (twelve) hours. 10 capsule Corlis Burnard DEL, NP      PDMP not reviewed this encounter.    [1]  Social History Tobacco Use   Smoking status: Former    Current packs/day: 0.00    Average packs/day: 0.5 packs/day for 7.0 years (3.5 ttl pk-yrs)    Types: Cigarettes    Start date: 81    Quit date: 01/23/1982    Years since quitting: 42.6   Smokeless tobacco: Never  Substance Use Topics   Alcohol use: Yes    Alcohol/week: 2.0 standard drinks of alcohol    Types: 2 Cans of beer per week    Comment: 1 beer/day   Drug use: No     Corlis Burnard DEL, NP 08/21/24 1058  "

## 2024-08-27 ENCOUNTER — Ambulatory Visit
Admission: EM | Admit: 2024-08-27 | Discharge: 2024-08-27 | Disposition: A | Discharge status: Home / Self Care | Attending: Emergency Medicine | Admitting: Emergency Medicine

## 2024-08-27 ENCOUNTER — Encounter: Payer: Self-pay | Admitting: Emergency Medicine

## 2024-08-27 DIAGNOSIS — J069 Acute upper respiratory infection, unspecified: Secondary | ICD-10-CM

## 2024-08-27 MED ORDER — BENZONATATE 100 MG PO CAPS: 100.0000 mg | ORAL_CAPSULE | Freq: Three times a day (TID) | ORAL | 0 refills | Status: DC

## 2024-08-27 MED ORDER — GUAIFENESIN-CODEINE 100-10 MG/5ML PO SOLN: 5.0000 mL | Freq: Four times a day (QID) | ORAL | 0 refills | Status: AC | PRN

## 2024-08-27 MED ORDER — AZITHROMYCIN 250 MG PO TABS: 250.0000 mg | ORAL_TABLET | Freq: Every day | ORAL | 0 refills | Status: DC

## 2024-08-27 NOTE — ED Triage Notes (Signed)
 Patient complains of nasal drainage, chest congestion and itching all over x 1 week. Patient was diagnosed Flu A on 08-21-24.

## 2024-08-27 NOTE — ED Provider Notes (Signed)
 " [name]    CSN: [id number] Arrival date & time: 08/27/24  1015      History   Chief Complaint No chief complaint on file.   HPI Benjamin Robles is a 69 y.o. male.   Patient presents for evaluation of nasal congestion, postnasal drip, sinus pressure and a nonproductive cough present for 8 days.  Associated chest congestion but denies shortness of breath or wheezing.  Tolerating food and liquids.  Was a diagnosed with influenza 7 days ago and this urgent care, completed course of Tamiflu  additionally attempting NyQuil and Mucinex .  Symptoms worsening at nighttime making it difficult to sleep.   Past Medical History:  Diagnosis Date   Allergy    SINUS ALLEGIES   Anticoagulant disorder    Anxiety    BPH (benign prostatic hypertrophy)    mild   Bradycardia    Cancer (HCC)    CLL (chronic lymphocytic leukemia) (HCC)    Depression    GERD (gastroesophageal reflux disease)    History of colon polyps    normal colonoscopy 2012   Pain of right sternoclavicular joint    MRI 06/2012 showing ?erosive arthritis   Umbilical hernia     Patient Active Problem List   Diagnosis Date Noted   Fever blister 12/21/2023   Motion sickness 12/21/2023   SI (sacroiliac) joint dysfunction 04/21/2023   Somatic dysfunction of sacral region 04/21/2023   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   Multiple pulmonary nodules 12/30/2022   Erectile dysfunction 12/10/2022   Welcome to Medicare preventive visit 12/09/2022   Advanced directives, counseling/discussion 12/09/2022   Family history of coronary artery disease 12/09/2022   Post-nasal drainage 09/17/2022   Splenomegaly 05/12/2022   CLL (chronic lymphocytic leukemia) (HCC) 01/08/2022   Renal insufficiency 12/04/2021   Low serum vitamin B12 11/14/2021   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   Benign prostatic hyperplasia    Health maintenance examination 07/13/2012   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    Tinnitus 09/30/2010   IRRITABLE BOWEL SYNDROME 03/29/2010   Pure hypercholesterolemia 08/10/2007   Adjustment disorder with mixed anxiety and depressed mood 05/12/2007   Allergic rhinitis 05/03/2007   GERD (gastroesophageal reflux disease) 05/03/2007    Past Surgical History:  Procedure Laterality Date   COLONOSCOPY  08/07/2011   WNL, rpt 10 yrs   COLONOSCOPY  12/2021   HP x1, rpt 10 yrs Oletta)   ESOPHAGOGASTRODUODENOSCOPY  12/2021   WNL Oletta)   HERNIA REPAIR     x2   UPPER GASTROINTESTINAL ENDOSCOPY  08/07/2011   chronic superficial gastritis, oozing nodule clipped, neg H pylori       Home Medications    Prior to Admission medications  Medication Sig Start Date End Date Taking? Authorizing Provider  calcium  carbonate (TUMS - DOSED IN MG ELEMENTAL CALCIUM ) 500 MG chewable tablet Chew 1 tablet by mouth as needed.      [provider]  Cholecalciferol (VITAMIN D3 PO) Take 1 capsule by mouth daily.    [provider]  cyanocobalamin  (V-R VITAMIN B-12) 500 MCG tablet Take 1 tablet (500 mcg total) by mouth daily. 01/02/17   Rilla Baller, MD  esomeprazole  (NEXIUM ) 20 MG capsule Take 1 capsule (20 mg total) by mouth daily at 12 noon. 12/21/19   Rilla Baller, MD  fluticasone  (FLONASE ) 50 MCG/ACT nasal spray USE 2 SPRAYS NASALY DAILY 12/14/14   Rilla Baller, MD  hydrOXYzine  (ATARAX ) 25 MG tablet TAKE 1/2 TO 1 TABLET BY MOUTH TWO TIMES A DAY  AS NEEDED FOR ANXIETY. MAY CAUSE DROWSINESS 12/16/23   Rilla Baller, MD  magnesium  30 MG tablet Take 30 mg by mouth daily.    [provider]  multivitamin [facility]) per tablet Take 1 tablet by mouth daily.      [provider]  oseltamivir  (TAMIFLU ) 75 MG capsule Take 1 capsule (75 mg total) by mouth every 12 (twelve) hours. 08/21/24   Corlis Burnard DEL, NP  psyllium (METAMUCIL) 58.6 % powder Take 1 packet by mouth daily.     [provider]  sildenafil  (VIAGRA ) 100 MG tablet Take 0.5-1  tablets (50-100 mg total) by mouth as needed for erectile dysfunction. 12/21/23   Rilla Baller, MD  tamsulosin  (FLOMAX ) 0.4 MG CAPS capsule TAKE 1 CAPSULE BY MOUTH DAILY 06/20/24   Rilla Baller, MD  valACYclovir  (VALTREX ) 1000 MG tablet Take 2 tablets (2,000 mg total) by mouth 2 (two) times daily. For 1 day at a time per fever blister 12/21/23   Rilla Baller, MD    Family History Family History  Problem Relation Age of Onset   Hypertension Mother    Depression Father    Prostate cancer Father 61       slow growing   CAD Neg Hx    Stroke Neg Hx    Diabetes Neg Hx    Colon cancer Neg Hx    Rectal cancer Neg Hx    Stomach cancer Neg Hx     Social History Social History[1]   Allergies   Amoxicillin-pot clavulanate   Review of Systems Review of Systems  Constitutional: Negative.   HENT:  Positive for congestion, postnasal drip and sinus pressure. Negative for dental problem, drooling, ear discharge, ear pain, facial swelling, hearing loss, mouth sores, nosebleeds, rhinorrhea, sinus pain, sneezing, sore throat, tinnitus, trouble swallowing and voice change.   Respiratory:  Positive for cough. Negative for apnea, choking, chest tightness, shortness of breath, wheezing and stridor.   Cardiovascular: Negative.   Gastrointestinal: Negative.      Physical Exam Triage Vital Signs ED Triage Vitals [08/27/24 1221]  Encounter Vitals Group     BP (!) 144/84     Girls Systolic BP Percentile      Girls Diastolic BP Percentile      Boys Systolic BP Percentile      Boys Diastolic BP Percentile      Pulse Rate 71     Resp 18     Temp 98.4 F (36.9 C)     Temp Source Oral     SpO2 98 %     Weight      Height      Head Circumference      Peak Flow      Pain Score      Pain Loc      Pain Education      Exclude from Growth Chart    No data found.  Updated Vital Signs BP (!) 144/84 (BP Location: Right Arm)   Pulse 71   Temp 98.4 F (36.9 C) (Oral)   Resp 18    SpO2 98%   Visual Acuity Right Eye Distance:   Left Eye Distance:   Bilateral Distance:    Right Eye Near:   Left Eye Near:    Bilateral Near:     Physical Exam Constitutional:      Appearance: Normal appearance.  HENT:     Right Ear: Tympanic membrane, ear canal and external ear normal.     Left Ear: Tympanic  membrane, ear canal and external ear normal.     Nose: Congestion present.     Mouth/Throat:     Pharynx: Posterior oropharyngeal erythema present. No oropharyngeal exudate.  Cardiovascular:     Rate and Rhythm: Normal rate and regular rhythm.     Pulses: Normal pulses.     Heart sounds: Normal heart sounds.  Pulmonary:     Effort: Pulmonary effort is normal.     Breath sounds: Normal breath sounds.  Neurological:     Mental Status: He is alert and oriented to person, place, and time. Mental status is at baseline.      UC Treatments / Results  Labs (all labs ordered are listed, but only abnormal results are displayed) Labs Reviewed - No data to display  EKG   Radiology No results found.  Procedures Procedures (including critical care time)  Medications Ordered in UC Medications - No data to display  Initial Impression / Assessment and Plan / UC Course  I have reviewed the triage vital signs and the nursing notes.  Pertinent labs & imaging results that were available during my care of the patient were reviewed by me and considered in my medical decision making (see chart for details).  Acute URI  Patient is in no signs of distress nor toxic appearing.  Vital signs are stable.  Low suspicion for pneumonia, pneumothorax or bronchitis and therefore will defer imaging.  Symptoms present for 7 days, no improvement seen empirically placed on azithromycin  and additionally prescribed Tessalon  and guaifenesin  codeine , PDMP reviewed, low risk.May use additional over-the-counter medications as needed for supportive care.  May follow-up with urgent care as needed if  symptoms persist or worsen.  Final Clinical Impressions(s) / UC Diagnoses   Final diagnoses:  None   Discharge Instructions   None    ED Prescriptions   None    PDMP not reviewed this encounter.     [1]  Social History Tobacco Use   Smoking status: Former    Current packs/day: 0.00    Average packs/day: 0.5 packs/day for 7.0 years (3.5 ttl pk-yrs)    Types: Cigarettes    Start date: 74    Quit date: 01/23/1982    Years since quitting: 42.6   Smokeless tobacco: Never  Substance Use Topics   Alcohol use: Yes    Alcohol/week: 2.0 standard drinks of alcohol    Types: 2 Cans of beer per week    Comment: 1 beer/day   Drug use: No     Teresa Shelba SAUNDERS, NP 08/27/24 1319  "

## 2024-08-27 NOTE — Discharge Instructions (Signed)
 Take azithromycin  for coverage of bacteria and ideally to prevent symptoms from worsening  You may use Tessalon  pill every 8 hours as needed for coughing you may use cough syrup every 6 hours but please be mindful can make you feel drowsy    You can take Tylenol  and/or Ibuprofen as needed for fever reduction and pain relief.   For cough: honey 1/2 to 1 teaspoon (you can dilute the honey in water or another fluid).  You can also use guaifenesin  and dextromethorphan for cough. You can use a humidifier for chest congestion and cough.  If you don't have a humidifier, you can sit in the bathroom with the hot shower running.      For sore throat: try warm salt water gargles, cepacol lozenges, throat spray, warm tea or water with lemon/honey, popsicles or ice, or OTC cold relief medicine for throat discomfort.   For congestion: take a daily anti-histamine like Zyrtec, Claritin, and a oral decongestant, such as pseudoephedrine.  You can also use Flonase  1-2 sprays in each nostril daily.   It is important to stay hydrated: drink plenty of fluids (water, gatorade/powerade/pedialyte, juices, or teas) to keep your throat moisturized and help further relieve irritation/discomfort.

## 2024-09-05 NOTE — Progress Notes (Unsigned)
 " [name] [address] [place] [address] Phone: [phone] Subjective:   Benjamin Robles, am serving as a scribe for Dr. Arthea Claudene.  I'm seeing this patient by the request  of:  Rilla Baller, MD  CC: Back and neck pain follow-up  YEP:Dlagzrupcz  Benjamin Robles is a 69 y.o. male coming in with complaint of back and neck pain. OMT 06/07/2024. Patient states that he had the flu over New Years. R side of lumbar spine is irritated due to sitting. Also has pain in middle of t-spine from coughing. Shoulder pain subsided when he was resting. No worse but no better. No limited in his activity.   B knee pain with walking but not when using elliptical.   Medications patient has been prescribed: None  Taking:      Reviewing patient's chart unfortunately was in the urgent care facility at the end of December and early January for influenza A and potentially bronchitis.  Past medical history significant for CLL   Reviewed prior external information including notes and imaging from previsou exam, outside providers and external EMR if available.   As well as notes that were available from care everywhere and other healthcare systems.  Past medical history, social, surgical and family history all reviewed in electronic medical record.  No pertanent information unless stated regarding to the chief complaint.   Past Medical History:  Diagnosis Date   Allergy    SINUS ALLEGIES   Anticoagulant disorder    Anxiety    BPH (benign prostatic hypertrophy)    mild   Bradycardia    Cancer (HCC)    CLL (chronic lymphocytic leukemia) (HCC)    Depression    GERD (gastroesophageal reflux disease)    History of colon polyps    normal colonoscopy 2012   Pain of right sternoclavicular joint    MRI 06/2012 showing ?erosive arthritis   Umbilical hernia     Allergies[1]   Review of Systems:  No headache, visual changes, nausea, vomiting, diarrhea,  constipation, dizziness, abdominal pain, skin rash, fevers, chills, night sweats, weight loss, swollen lymph nodes, body aches, joint swelling, chest pain, shortness of breath, mood changes. POSITIVE muscle aches  Objective  Blood pressure 108/64, pulse (!) 50, height 6' (1.829 m), weight 181 lb (82.1 kg), SpO2 100%.   General: No apparent distress alert and oriented x3 mood and affect normal, dressed appropriately.  Appears minorly pale HEENT: Pupils equal, extraocular movements intact  Respiratory: Patient's speak in full sentences and does not appear short of breath  Cardiovascular: No lower extremity edema, non tender, no erythema  Gait MSK:  Back does have some loss of lordosis noted.  Some tightness noted in the sacroiliac joint left greater than right at the moment.  Osteopathic findings  C2 flexed rotated and side bent right C6 flexed rotated and side bent left T3 extended rotated and side bent right inhaled rib T9 extended rotated and side bent left L2 flexed rotated and side bent right Sacrum right on right       Assessment and Plan:  SI (sacroiliac) joint dysfunction Continue to monitor, continue to do well with home exercises.  Patient did have the recent viral illness.  Will continue to monitor for the coughing aspect.  Increase activity slowly otherwise.  Follow-up with me again in 6-8    Nonallopathic problems  Decision today to treat with OMT was based on Physical Exam  After verbal consent patient was  treated with HVLA, ME, FPR techniques in cervical, rib, thoracic, lumbar, and sacral  areas only muscle energy on the neck  Patient tolerated the procedure well with improvement in symptoms  Patient given exercises, stretches and lifestyle modifications  See medications in patient instructions if given  Patient will follow up in 4-8 weeks     The above documentation has been reviewed and is accurate and complete Benjamin Robles M Benjamin Hesser, DO         Note:  This dictation was prepared with Dragon dictation along with smaller phrase technology. Any transcriptional errors that result from this process are unintentional.            [1]  Allergies Allergen Reactions   Amoxicillin-Pot Clavulanate     REACTION: achey, itch   "

## 2024-09-07 ENCOUNTER — Ambulatory Visit: Admitting: Family Medicine

## 2024-09-07 ENCOUNTER — Encounter: Payer: Self-pay | Admitting: Family Medicine

## 2024-09-07 VITALS — BP 108/64 | HR 50 | Ht 72.0 in | Wt 181.0 lb

## 2024-09-07 DIAGNOSIS — M9903 Segmental and somatic dysfunction of lumbar region: Secondary | ICD-10-CM | POA: Diagnosis not present

## 2024-09-07 DIAGNOSIS — M9902 Segmental and somatic dysfunction of thoracic region: Secondary | ICD-10-CM | POA: Diagnosis not present

## 2024-09-07 DIAGNOSIS — M533 Sacrococcygeal disorders, not elsewhere classified: Secondary | ICD-10-CM | POA: Diagnosis not present

## 2024-09-07 DIAGNOSIS — M9901 Segmental and somatic dysfunction of cervical region: Secondary | ICD-10-CM | POA: Diagnosis not present

## 2024-09-07 DIAGNOSIS — M9908 Segmental and somatic dysfunction of rib cage: Secondary | ICD-10-CM

## 2024-09-07 DIAGNOSIS — M9904 Segmental and somatic dysfunction of sacral region: Secondary | ICD-10-CM | POA: Diagnosis not present

## 2024-09-07 NOTE — Patient Instructions (Signed)
See me in 2-3 months

## 2024-09-07 NOTE — Assessment & Plan Note (Signed)
 Continue to monitor, continue to do well with home exercises.  Patient did have the recent viral illness.  Will continue to monitor for the coughing aspect.  Increase activity slowly otherwise.  Follow-up with me again in 6-8

## 2024-09-21 ENCOUNTER — Ambulatory Visit (INDEPENDENT_AMBULATORY_CARE_PROVIDER_SITE_OTHER)
Admission: RE | Admit: 2024-09-21 | Discharge: 2024-09-21 | Disposition: A | Discharge status: Home / Self Care | Source: Ambulatory Visit | Attending: Family Medicine | Admitting: Family Medicine

## 2024-09-21 ENCOUNTER — Ambulatory Visit: Admitting: Family Medicine

## 2024-09-21 VITALS — BP 128/82 | HR 60 | Temp 98.6°F | Ht 72.0 in | Wt 182.0 lb

## 2024-09-21 DIAGNOSIS — C9110 Chronic lymphocytic leukemia of B-cell type not having achieved remission: Secondary | ICD-10-CM | POA: Diagnosis not present

## 2024-09-21 DIAGNOSIS — R052 Subacute cough: Secondary | ICD-10-CM

## 2024-09-21 MED ORDER — BENZONATATE 100 MG PO CAPS: 100.0000 mg | ORAL_CAPSULE | Freq: Three times a day (TID) | ORAL | 0 refills | Status: AC

## 2024-09-21 NOTE — Progress Notes (Signed)
 " Ph: [phone] Fax: [phone]   Patient ID: Benjamin Robles, male    DOB: 06-24-1956, 69 y.o.   MRN: [id number]  This visit was conducted in person.  BP 128/82   Pulse 60   Temp 98.6 F (37 C) (Oral)   Ht 6' (1.829 m)   Wt 182 lb (82.6 kg)   SpO2 98%   BMI 24.68 kg/m    CC: cough Subjective:   HPI: Benjamin Robles is a 69 y.o. male presenting on 09/21/2024 for Cough (With congestion had flu week of new years. Did not resolve after flu. Cough is dry. Denies any SOB or wheezing )   Influenza illness 08/21/2024 treated with tamiflu .  Seen again at [facility] on 08/27/2024 with ongoing cough and respiratory symptoms - treated with empiric azithromycin , along with cheratussin cough syrup and tessalon  perls.   Symptoms have persisted - notes ongoing chest congestion, PNDrainage with mildly sore throat, dry cough. Today actually feeling better.   Denies fevers/chills, sinus symptoms, ear or tooth pain, sore throat. No dyspnea, wheeze, leg swelling.  He continues flonase  for years.  He continues using elliptical MWF AM without difficulty.   Treated with guaifenesin  with water, tried tessalon  perls, codeine  cough syrup. Tessalon  helps temporarily.  Pharmacist recommend primatene myst and NAC - did not tolerate this well due to some dizziness, nausea.   Denies GERD symptoms  with regular nexium  use.  Denies allergic symptoms.   Notes ongoing BPH symptoms despite taking flomax . Lab Results  Component Value Date   PSA 1.59 12/14/2023   PSA 2.14 12/02/2022   PSA 1.46 11/19/2021    CLL diagnosed 2024, followed by Dr Babara oncology - active surveillance. Lab Results  Component Value Date   WBC 15.8 (H) 05/18/2024   HGB 14.3 05/18/2024   HCT 42.4 05/18/2024   MCV 90.0 05/18/2024   PLT 272 05/18/2024   Lab Results  Component Value Date   NA 135 05/18/2024   CL 102 05/18/2024   K 4.1 05/18/2024   CO2 25 05/18/2024   BUN 22 05/18/2024   CREATININE 1.15 05/18/2024   GFRNONAA >60  05/18/2024   CALCIUM  9.2 05/18/2024   ALBUMIN 4.4 05/18/2024   GLUCOSE 137 (H) 05/18/2024        Relevant past medical, surgical, family and social history reviewed and updated as indicated. Interim medical history since our last visit reviewed. Allergies and medications reviewed and updated. Outpatient Medications Prior to Visit  Medication Sig Dispense Refill   calcium  carbonate (TUMS - DOSED IN MG ELEMENTAL CALCIUM ) 500 MG chewable tablet Chew 1 tablet by mouth as needed.       Cholecalciferol (VITAMIN D3 PO) Take 1 capsule by mouth daily.     cyanocobalamin  (V-R VITAMIN B-12) 500 MCG tablet Take 1 tablet (500 mcg total) by mouth daily.     esomeprazole  (NEXIUM ) 20 MG capsule Take 1 capsule (20 mg total) by mouth daily at 12 noon.     fluticasone  (FLONASE ) 50 MCG/ACT nasal spray USE 2 SPRAYS NASALY DAILY 48 g 3   guaiFENesin -codeine  100-10 MG/5ML syrup Take 5 mLs by mouth every 6 (six) hours as needed for cough. 120 mL 0   hydrOXYzine  (ATARAX ) 25 MG tablet TAKE 1/2 TO 1 TABLET BY MOUTH TWO TIMES A DAY AS NEEDED FOR ANXIETY. MAY CAUSE DROWSINESS 30 tablet 1   magnesium  30 MG tablet Take 30 mg by mouth daily.     multivitamin (THERAGRAN) per tablet Take 1 tablet by  mouth daily.       psyllium (METAMUCIL) 58.6 % powder Take 1 packet by mouth daily.      sildenafil  (VIAGRA ) 100 MG tablet Take 0.5-1 tablets (50-100 mg total) by mouth as needed for erectile dysfunction. 10 tablet 11   tamsulosin  (FLOMAX ) 0.4 MG CAPS capsule TAKE 1 CAPSULE BY MOUTH DAILY 90 capsule 1   valACYclovir  (VALTREX ) 1000 MG tablet Take 2 tablets (2,000 mg total) by mouth 2 (two) times daily. For 1 day at a time per fever blister 20 tablet 0   azithromycin  (ZITHROMAX ) 250 MG tablet Take 1 tablet (250 mg total) by mouth daily. Take first 2 tablets together, then 1 every day until finished. 6 tablet 0   benzonatate  (TESSALON ) 100 MG capsule Take 1 capsule (100 mg total) by mouth every 8 (eight) hours. 21 capsule 0    oseltamivir  (TAMIFLU ) 75 MG capsule Take 1 capsule (75 mg total) by mouth every 12 (twelve) hours. 10 capsule 0   No facility-administered medications prior to visit.     Per HPI unless specifically indicated in ROS section below Review of Systems  Objective:  BP 128/82   Pulse 60   Temp 98.6 F (37 C) (Oral)   Ht 6' (1.829 m)   Wt 182 lb (82.6 kg)   SpO2 98%   BMI 24.68 kg/m   Wt Readings from Last 3 Encounters:  09/21/24 182 lb (82.6 kg)  09/07/24 181 lb (82.1 kg)  06/07/24 178 lb (80.7 kg)      Physical Exam Vitals and nursing note reviewed.  Constitutional:      Appearance: Normal appearance. He is not ill-appearing.  HENT:     Head: Normocephalic and atraumatic.     Right Ear: Hearing, tympanic membrane, ear canal and external ear normal. There is no impacted cerumen.     Left Ear: Hearing, tympanic membrane, ear canal and external ear normal. There is no impacted cerumen.     Nose: No mucosal edema, congestion or rhinorrhea.     Right Turbinates: Not enlarged or swollen.     Left Turbinates: Not enlarged or swollen.     Right Sinus: No maxillary sinus tenderness or frontal sinus tenderness.     Left Sinus: No maxillary sinus tenderness or frontal sinus tenderness.     Mouth/Throat:     Mouth: Mucous membranes are moist.     Pharynx: Oropharynx is clear. No oropharyngeal exudate or posterior oropharyngeal erythema.  Eyes:     Extraocular Movements: Extraocular movements intact.     Conjunctiva/sclera: Conjunctivae normal.     Pupils: Pupils are equal, round, and reactive to light.  Cardiovascular:     Rate and Rhythm: Normal rate and regular rhythm.     Pulses: Normal pulses.     Heart sounds: Normal heart sounds. No murmur heard. Pulmonary:     Effort: Pulmonary effort is normal. No respiratory distress.     Breath sounds: Normal breath sounds. No wheezing, rhonchi or rales.  Musculoskeletal:     Cervical back: Normal range of motion and neck supple. No  rigidity.     Right lower leg: No edema.     Left lower leg: No edema.  Lymphadenopathy:     Cervical: No cervical adenopathy.  Skin:    General: Skin is warm and dry.     Findings: No rash.  Neurological:     Mental Status: He is alert.  Psychiatric:        Mood and Affect: Mood  normal.        Behavior: Behavior normal.        Assessment & Plan:   Problem List Items Addressed This Visit     CLL (chronic lymphocytic leukemia) (HCC) (Chronic)   Subacute cough - Primary   Initial influenza with residual persistent cough but with reassuring exam, vitals, and CXR today. Anticipate residual post-infectious cough, supportive measures reviewed - refill tessalon  perls. Update if not improving as expected over next few days. Pt agrees with plan.       Relevant Orders   DG Chest 2 View     Meds ordered this encounter  Medications   benzonatate  (TESSALON ) 100 MG capsule    Sig: Take 1 capsule (100 mg total) by mouth every 8 (eight) hours.    Dispense:  30 capsule    Refill:  0    Orders Placed This Encounter  Procedures   DG Chest 2 View    Standing Status:   Future    Number of Occurrences:   1    Expiration Date:   09/21/2025    Reason for Exam (SYMPTOM  OR DIAGNOSIS REQUIRED):   ongoing cough x 1 month    Preferred imaging location?:   [facility]    Patient Instructions  Lungs clear today  Chest xray today  Likely post-infectious cough due to residual inflammation in the lungs.  Let us  know if fever >101, worsening productive cough, shortness of breath, new or worsening symptoms for further evaluation/treatment.  Continue tessalon  perls.   Schedule physical for May   Follow up plan: Return if symptoms worsen or fail to improve.  Anton Blas, MD   "

## 2024-09-21 NOTE — Patient Instructions (Addendum)
 Lungs clear today  Chest xray today  Likely post-infectious cough due to residual inflammation in the lungs.  Let us  know if fever >101, worsening productive cough, shortness of breath, new or worsening symptoms for further evaluation/treatment.  Continue tessalon  perls.   Schedule physical for May

## 2024-09-21 NOTE — Assessment & Plan Note (Signed)
 Initial influenza with residual persistent cough but with reassuring exam, vitals, and CXR today. Anticipate residual post-infectious cough, supportive measures reviewed - refill tessalon  perls. Update if not improving as expected over next few days. Pt agrees with plan.

## 2024-09-24 ENCOUNTER — Ambulatory Visit: Payer: Self-pay | Admitting: Family Medicine

## 2024-11-16 ENCOUNTER — Ambulatory Visit: Admitting: Oncology

## 2024-11-16 ENCOUNTER — Other Ambulatory Visit

## 2024-12-01 ENCOUNTER — Ambulatory Visit: Admitting: Family Medicine

## 2024-12-06 ENCOUNTER — Ambulatory Visit

## 2024-12-14 ENCOUNTER — Other Ambulatory Visit

## 2024-12-21 ENCOUNTER — Encounter: Admitting: Family Medicine

## 2025-01-05 ENCOUNTER — Ambulatory Visit
